# Patient Record
Sex: Female | Born: 1960
Health system: Southern US, Community
[De-identification: ages and names within clinical notes are randomized; demographics above are authoritative.]

## PROBLEM LIST (undated history)

## (undated) DIAGNOSIS — K589 Irritable bowel syndrome without diarrhea: Secondary | ICD-10-CM

## (undated) DIAGNOSIS — Z5189 Encounter for other specified aftercare: Secondary | ICD-10-CM

## (undated) DIAGNOSIS — Z9289 Personal history of other medical treatment: Secondary | ICD-10-CM

## (undated) DIAGNOSIS — I10 Essential (primary) hypertension: Secondary | ICD-10-CM

## (undated) DIAGNOSIS — M1990 Unspecified osteoarthritis, unspecified site: Secondary | ICD-10-CM

## (undated) HISTORY — PX: COLONOSCOPY: SHX174

## (undated) HISTORY — DX: Essential (primary) hypertension: I10

## (undated) HISTORY — DX: Encounter for other specified aftercare: Z51.89

## (undated) HISTORY — PX: BACK SURGERY: SHX140

## (undated) HISTORY — DX: Irritable bowel syndrome, unspecified: K58.9

---

## 1994-10-13 HISTORY — PX: ABDOMINAL HYSTERECTOMY: SHX81

## 2002-07-10 ENCOUNTER — Emergency Department (HOSPITAL_COMMUNITY): Admission: EM | Admit: 2002-07-10 | Discharge: 2002-07-10 | Payer: Self-pay | Admitting: Emergency Medicine

## 2002-07-16 ENCOUNTER — Encounter: Payer: Self-pay | Admitting: Specialist

## 2002-07-16 ENCOUNTER — Encounter (INDEPENDENT_AMBULATORY_CARE_PROVIDER_SITE_OTHER): Payer: Self-pay | Admitting: Specialist

## 2002-07-16 ENCOUNTER — Ambulatory Visit (HOSPITAL_COMMUNITY): Admission: RE | Admit: 2002-07-16 | Discharge: 2002-07-17 | Payer: Self-pay | Admitting: Specialist

## 2004-09-18 ENCOUNTER — Encounter: Admission: RE | Admit: 2004-09-18 | Discharge: 2004-09-18 | Payer: Self-pay | Admitting: Specialist

## 2004-10-13 HISTORY — PX: CERVICAL FUSION: SHX112

## 2004-10-13 HISTORY — PX: CHOLECYSTECTOMY: SHX55

## 2005-05-27 ENCOUNTER — Encounter (INDEPENDENT_AMBULATORY_CARE_PROVIDER_SITE_OTHER): Payer: Self-pay | Admitting: *Deleted

## 2005-05-27 ENCOUNTER — Ambulatory Visit (HOSPITAL_COMMUNITY): Admission: RE | Admit: 2005-05-27 | Discharge: 2005-05-28 | Payer: Self-pay | Admitting: Surgery

## 2005-08-14 ENCOUNTER — Ambulatory Visit (HOSPITAL_COMMUNITY): Admission: RE | Admit: 2005-08-14 | Discharge: 2005-08-15 | Payer: Self-pay | Admitting: Orthopaedic Surgery

## 2005-11-24 ENCOUNTER — Encounter: Admission: RE | Admit: 2005-11-24 | Discharge: 2005-11-24 | Payer: Self-pay | Admitting: Orthopaedic Surgery

## 2006-02-15 ENCOUNTER — Encounter: Admission: RE | Admit: 2006-02-15 | Discharge: 2006-02-15 | Payer: Self-pay | Admitting: Orthopaedic Surgery

## 2006-05-02 ENCOUNTER — Encounter: Admission: RE | Admit: 2006-05-02 | Discharge: 2006-05-02 | Payer: Self-pay | Admitting: Orthopaedic Surgery

## 2006-07-16 ENCOUNTER — Inpatient Hospital Stay (HOSPITAL_COMMUNITY): Admission: RE | Admit: 2006-07-16 | Discharge: 2006-07-20 | Payer: Self-pay | Admitting: Orthopaedic Surgery

## 2006-07-21 ENCOUNTER — Inpatient Hospital Stay (HOSPITAL_COMMUNITY): Admission: AD | Admit: 2006-07-21 | Discharge: 2006-07-27 | Payer: Self-pay | Admitting: Orthopaedic Surgery

## 2006-07-26 ENCOUNTER — Ambulatory Visit: Payer: Self-pay | Admitting: Internal Medicine

## 2007-01-25 ENCOUNTER — Encounter: Admission: RE | Admit: 2007-01-25 | Discharge: 2007-01-25 | Payer: Self-pay | Admitting: Orthopaedic Surgery

## 2007-03-09 ENCOUNTER — Encounter: Admission: RE | Admit: 2007-03-09 | Discharge: 2007-03-09 | Payer: Self-pay | Admitting: Orthopaedic Surgery

## 2008-01-22 ENCOUNTER — Encounter: Admission: RE | Admit: 2008-01-22 | Discharge: 2008-01-22 | Payer: Self-pay | Admitting: Orthopaedic Surgery

## 2010-11-03 ENCOUNTER — Encounter: Payer: Self-pay | Admitting: Specialist

## 2011-02-28 NOTE — Op Note (Signed)
Jennifer [name], Jennifer [name]                ACCOUNT NO.:  [id number]   MEDICAL RECORD NO.:  [id number]          PATIENT TYPE:  INP   LOCATION:  2550                         FACILITY:  [facility]   PHYSICIAN:  Sharolyn Douglas, M.D.        DATE OF BIRTH:  06/23/1961   DATE OF PROCEDURE:  07/16/2006  DATE OF DISCHARGE:                                 OPERATIVE REPORT   DIAGNOSES:  1. L5-S1 degenerative disk disease.  2. L5-S1 post-laminectomy syndrome.  3. L5-S1 lateral recess and foraminal stenosis.  4. Left-sided L4-5 disk bulge.   PROCEDURE:  1. Revision L5-S1 laminectomy with decompression of the thecal sac and      nerve roots bilaterally.  2. L5-S1 transforaminal lumbar interbody fusion with placement of 9-mm      PEEK cage.  3. L5-S1 pedicle screw instrumentation using the Abbott spine system.  4. Posterior spinal fusion, L5-S1.  5. Local autogenous bone graft supplemented with bone morphogenic protein.   SURGEON:  Sharolyn Douglas, M.D.   ASSISTANT:  Verlin Fester, P.A.   ANESTHESIA:  General endotracheal.   COMPLICATIONS:  None.   NEEDLE AND SPONGE COUNT:  Correct.   INDICATIONS:  The patient is a pleasant 50 year old female who has had  chronic progressive pain in her back and bilateral lower extremities, left  greater than right.  She has a history of L5-S1 bilateral laminotomy and  diskectomy.  Her imaging studies show severe degenerative changes at L5-S1  with disk space narrowing.  At L4-5, there is a small bulging disk on the  left side which does not appear to impinge on the nerve roots.  We discussed  various treatment options including lumbar decompression and fusion; at this  point, she has elected to proceed in hopes of improving her symptoms.  We  decided not to include the L4-5 level in the fusion, as there were very  minimal degenerative changes, except for the bulging disk on the left and  did not seem to warrant such an aggressive approach.  She is aware that she  may  develop degenerative changes in the future requiring extension of the  fusion.  Risks, benefits and alternatives were reviewed.   PROCEDURE:  The patient was identified in the holding area and after  informed consent, she was taken to the operating room.  She underwent  general endotracheal anesthesia without difficulty, given prophylactic IV  antibiotics.  Neuro-monitoring was established in the form of SSEPs and  lower extremity EMGs.  She was carefully turned prone onto the Wilson frame,  all bony prominences padded, face and eyes protected at all times.  Back  prepped and draped in the usual sterile fashion.  A midline incision was  made from L4 down to the sacrum.  The previous incision was off to the right  side over L5-S1 and this was not utilized, as it was off center.  Dissection  was carried to a very deep adipose layer.  The deep fascia was incised and a  subperiosteal exposure was carried out to the tips of the transverse process  of  L5 and the sacral ala bilaterally.  Again, this was very deep, due to the  patient's body habitus and the extreme lordosis.  Deep retractors were  placed; intraoperative x-ray was taken, confirming the levels.  We then  turned our attention to identifying the previous laminotomies at L5-S1  bilaterally.  Because of the severe disk space collapse, there was shingling  and bony overgrowth.  This was removed using the Leksell rongeur.  We then  removed the spinous process of 5 and used a high-speed bur to thin the  lamina.  Curettes and Kerrison punches were used to complete the revision  laminotomy.  Once the edges had been fully identified from the previous  defects, the epidural fibrosis was carefully cleared and the laminectomy  extended proximally.  We identified the S1 nerve roots bilaterally.  On the  left side especially, there was extensive scarring as well as a spur which  appeared to be impinging directly on the left S1 nerve root at its  takeoff.  This was carefully removed.  The dura over this area became quite thin and  there was an arachnoid bleb.  This was repaired using a 4-0 Nurolon suture  x1.  There was no active CSF leak, but it was felt that this could  potentially become a leak and therefore the suture was placed.  Once we were  satisfied with the decompression, we turned our attention to performing a  transforaminal lumbar interbody fusion in an effort to increase the fusion  rate and indirectly decompress the foramen.  The remaining facet joint on  the left side was osteotomized.  The disk space was completely collapsed and  we had to use successively larger dilators in order to distract up to 9 mm.  We then used curettes to clean off the cartilaginous endplates.  There was  no disk material in the disk space itself from the degenerative changes.  Once we had cleaned the disk space out, we packed it with BMP sponges and  local bone graft obtained from the laminectomy.  A 9-mm PEEK cage was packed  with a BMP sponge, carefully inserted into the interspace and tamped  anteriorly and across the midline; free-running EMGs were monitored  throughout this procedure and there were no deleterious changes.  We then  turned our attention to completing the posterior spinal fusion by  decorticating the transverse process of L5 and sacral ala bilaterally,  packing the remaining local bone graft into the gutters.  We placed pedicle  screws at L5 and S1 using anatomic probing technique.  We utilized 6.5 x 45-  mm screws at L5 and 7.5 x 30-mm screws in the sacrum.  We had good screw  purchase.  We stimulated each pedicle screw; there were no deleterious  changes.  In addition, the pedicle screw holes were palpated before placing  the screws and there were no breeches.  Hemostasis was achieved.  We elected  to place Duragen and Tisseel over the small dural repair.  A deep Hemovac drain was left, the deep fascia closed with a  running #1 Vicryl suture,  subcutaneous layer closed with 0 Vicryl followed by 2-0 Vicryl and then a  running 3-0 subcuticular Vicryl suture on the skin edges, Benzoin and Steri-  Strips placed and sterile dressing applied.  The patient was turned supine,  extubated without difficulty and transferred to Recovery in stable  condition, able to move her upper and lower extremities.   It should be noted  that my assistant, Verlin Fester, P.A., was present  throughout the procedure including during the positioning, the exposure, the  decompression, the instrumentation and the fusion.  She also assisted with  wound closure.      Sharolyn Douglas, M.D.  Electronically Signed     MC/MEDQ  D:  07/16/2006  T:  07/18/2006  Job:  [id number]

## 2011-02-28 NOTE — Discharge Summary (Signed)
NAMESERAI, TUKES                ACCOUNT NO.:  [id number]   MEDICAL RECORD NO.:  [id number]          PATIENT TYPE:  INP   LOCATION:  5005                         FACILITY:  [facility]   PHYSICIAN:  Sharolyn Douglas, M.D.        DATE OF BIRTH:  1961-01-02   DATE OF ADMISSION:  07/21/2006  DATE OF DISCHARGE:  07/27/2006                               DISCHARGE SUMMARY   ADMISSION DIAGNOSES:  1. Lumbar wound drainage, possible infection.  2. Previous L4-S1 fusion on June 16, 2006.   DISCHARGE DIAGNOSIS:  Status post I&D, doing well.   CONSULTATIONS:  Infectious Disease.   LABORATORY DATA:  CBC from July 21, 2006 did show decreased hemoglobin  and hematocrit, which remained decreased throughout her hospital stay  with low being 8.6 and 24.8 on July 25, 2006.  Her white count  started at 10.2 on July 21, 2006, on July 23, 2006 it was 9.6,  July 24, 2006 it was 14.5, and July 27, 2006 it was 11.3.  RDW  remained elevated ranging from 14.9 to 15.3 throughout her hospital  stay.  Platelets remained elevated ranging from 445 to 555 throughout  her hospital stay.  Basic metabolic panel showed elevated glucose 122,  otherwise normal.  On July 21, 2006 CRP was 30.3.  UA from July 21, 2006 was negative.  Blood cultures from July 21, 2006 showed no growth  at 5 days.  Urine culture showed no growth.  Wound culture from July 22, 2006 showed no growth at 2 days.  Anaerobic cultures showed no  anaerobes.  No x-rays.  No EKG was done.   PROCEDURE:  On July 22, 2006, patient was taken to the operating room  for I&D of her lumbar wound.   SURGEON:  Sharolyn Douglas, M.D.   ASSISTANT:  Verlin Fester, P.A.   ANESTHESIA:  General.   BRIEF HISTORY:  Patient is a 50 year old female who underwent lumbar  spine surgery L4-S1 fusion on July 16, 2006.  She was discharged on  July 20, 2006.  At that point, she was doing very well.  Since then,  unfortunately has developed  increasing redness and problems and returned  to see Dr. Noel Gerold.  He found some drainage from the wound as well as some  erythema around.  Thought she may have an early infection and thought  the best course management would be a washout.  The infection is  secondary to the possibility of having a deep infection.  The risk and  benefits were discussed with her.  She indicated understanding.  We  opted to proceed with the I&D.   HOSPITAL COURSE:  Patient was admitted to the hospital on July 22, 2006.  She was taken that afternoon for an I&D.  She tolerated the  procedure well without any intraoperative complications.   Postoperatively, patient was placed on vancomycin while we awaited the  culture results.  She did well throughout her hospital stay.  She  progressed along well with physical therapy.  She was kept for several  days postoperatively until her culture  results were final.  Infectious  disease consult was done during her hospital stay for assistance with  antibiotic recommendations.  They recommended doxycycline and Rocephin  for her discharge medications.   On July 27, 2006, patient was doing well.  Her culture results were  final.  Infectious disease had made good recommendations of Rocephin and  doxycycline, then patient was discharged home on those medications.   PLAN:  Patient is a 50 year old female status post irrigation and  debridement of a lumbar wound.  Activity daily ambulation.  Back  precautions at all times.  Brace only when she is up.  Daily dressing  changes.  Notify us of any signs of increasing drainage or redness.  Daily ambulation program.   DIET:  Regular home diet as tolerated.   DISCHARGE MEDICATIONS:  1. Rocephin  2. Doxycycline.  3. Percocet for pain.  4. Robaxin for muscle spasms.  5. Multivitamin daily.  6. Calcium daily.  7. Colace as needed.  8. Laxative as needed.   AVOID NON-STEROIDAL ANTI-INFLAMMATORY DRUGS (NSAIDS)    CONDITION ON DISCHARGE:  Stable and improved.   DISPOSITION:  Patient can be discharged to her home with her families  assistance as well as home health physical therapy and occupational  therapy.      Verlin Fester, P.A.      Sharolyn Douglas, M.D.  Electronically Signed    CM/MEDQ  D:  09/17/2006  T:  09/17/2006  Job:  [id number]   cc:   Sharolyn Douglas, M.D.

## 2011-02-28 NOTE — H&P (Signed)
NAMESHARNETTA, GIELOW                ACCOUNT NO.:  [id number]   MEDICAL RECORD NO.:  [id number]          PATIENT TYPE:  INP   LOCATION:  5005                         FACILITY:  [facility]   PHYSICIAN:  Sharolyn Douglas, M.D.        DATE OF BIRTH:  1961-10-12   DATE OF ADMISSION:  07/21/2006  DATE OF DISCHARGE:                                HISTORY & PHYSICAL   CHIEF COMPLAINT:  Lumbar wound drainage.   HISTORY OF PRESENT ILLNESS:  The patient is a pleasant 50 year old female  who is now five days status post revision L5-S1 decompression and fusion.  Her postoperative course was unremarkable, and she was discharged home on  postoperative day #3.  She called the office today with increasing wound  drainage.  I asked that she come in immediately.  In the office she was  found to have a serosanguineous drainage; and I felt it would be best that  she be admitted to the hospital for formal I&D of the wound and antibiotic  treatment after cultures.   She has had a low grade fever at home.   No nausea or vomiting.  No chest pain.  No numbness, tingling or weakness.  No headaches.   PAST MEDICAL HISTORY:  Noncontributory.   PAST SURGICAL HISTORY:  Partial hysterectomy; right knee arthroscopy x two;  and anterior cervical fusion.  She has also had a bilateral L5-S1 hemi  laminotomy by Dr. Otelia Sergeant; and the recent L5-S1 fusion five days ago.   ALLERGIES:  LODINE.   SOCIAL HISTORY:  She is married.  She does not work.  She does not use  tobacco or alcohol.   PHYSICAL EXAMINATION:  GENERAL APPEARANCE:  5 feet 5 inches tall.  She is  pleasant and cooperative.  BACK:  She has a serosanguineous drainage from the superior portion of her  wound.  There is also some redness and blistering which appears to be  related to the tape, although it could be a cellulitis.  NEUROLOGIC:  Neurologically, she is intact.  Straight leg raising is  negative.  CHEST:  Her chest is clear to auscultation.  HEENT:   Normocephalic, atraumatic.  HEART:  Regular rate and rhythm.  Distal pulses are palpable.  ABDOMEN:  Soft, nontender.   IMPRESSION:  Lumbar wound drainage, possible deep infection.   PLAN:  I reviewed the situation with the patient and her husband.  She is a  rather large lady and I am concerned she may have a deep infection.  I think  in this case it is better to be aggressive, and I will admit her to the  hospital for formal I&D of the wound tomorrow.  We will obtain laboratory  studies today and start her on vancomycin.  The risks, benefits and  alternatives were reviewed.      Sharolyn Douglas, M.D.  Electronically Signed     MC/MEDQ  D:  07/21/2006  T:  07/22/2006  Job:  [id number]

## 2011-02-28 NOTE — Op Note (Signed)
Jennifer [name], Jennifer [name]                         ACCOUNT NO.:  [id number]   MEDICAL RECORD NO.:  [id number]                   PATIENT TYPE:  OIB   LOCATION:  5008                                 FACILITY:  [facility]   PHYSICIAN:  Kerrin Champagne, M.D.                DATE OF BIRTH:  Apr 29, 1961   DATE OF PROCEDURE:  07/16/2002  DATE OF DISCHARGE:  07/17/2002                                 OPERATIVE REPORT   PREOPERATIVE DIAGNOSIS:  Large herniated nucleus pulposus, L5-S1 central and  left-sided, with extruded fragment.   POSTOPERATIVE DIAGNOSIS:  Large herniated nucleus pulposus, L5-S1 central  and left-sided, with extruded fragment.   PROCEDURE:  Left-sided L5-S1 microdiskectomy with exploration of disk, right  L5-S1.   SURGEON:  Kerrin Champagne, M.D.   ASSISTANT:  Wende Neighbors, P.A.   ANESTHESIA:  GOT, Burna Forts, M.D., supplemented with local  infiltration of Marcaine 0.5% with 1:200,000 epinephrine 15 cc.   ESTIMATED BLOOD LOSS:  15 cc.   DRAINS:  None.   SPECIMENS:  Disk material for gross only.   BRIEF CLINICAL HISTORY:  The patient is a 50 year old female with a two-  month history of back pain since having fallen in her yard.  She relates  over the last three weeks that her pain is significantly worse with the  discomfort radiating into both posterior legs.  Seen in the office.  MRI  studies demonstrate a large disk protrusion L5-S1 with extruded disk  material causing significant compression and cauda equina.  The patient's  clinical exam shows 3/3 sciatic tension signs positive, opposite straight  leg raise positive, diminished left ankle jerk.  She has significant loss of  function associated with her present condition, and prognosis for recovery  with conservative measures is markedly diminished with extruded herniation.  She is brought to the operating room to undergo microdiskectomy bilaterally  if necessary, as she has bilateral left complaints, left  side greater than  right.   INTRAOPERATIVE FINDINGS:  Large central disk herniation with extrusion  toward the left side.  Almost all of the disk material was removed via the  left microdiskectomy.  The right side did not require disk excision.  Large  ventral osteophyte also present, which was left intact.   DESCRIPTION OF PROCEDURE:  After adequate general anesthesia, patient in  knee-chest position, Andrews frame, standard preoperative antibiotics,  standard prep with Duraprep solution, draped in the usual manner, iodine Vi-  Drape was used.  Two spinal needles placed at the expected L5-S1 level with  intraoperative lateral radiograph demonstrating needles at L4-5.  The  incision made below these needles in line with midline approximately two  inches in length through the skin and subcutaneous layers after infiltration  with Marcaine 0.5% with 1:200,000 epinephrine.  Incision was carried sharply  down to the lumbodorsal fascia.  This was incised in both sides of the  spinous process of L4, seeing the L5 and S1.  Cobbs were then used to  elevate the paralumbar muscles off of the posterior elements of L5 and S1,  exposing the posterior interlaminar region bilaterally.  The McCullough  retractor was inserted, a clamp placed over the spinous process of L5, an  additional Penfield 4 placed within the facet at the right-sided L5-S1.  Intraoperative lateral radiograph demonstrated a clamp on the L5 spinous  process and the probe below this level.  Using loupe magnification, the  initial entry into the spinal canal was performed, first debriding the  superficial portions of the ligamentum flavum using the Leksell rongeur and  3 mm Kerrison bilaterally.  Entry was made in the spinal canal over the  superior aspect of the S1 lamina using a 2 and 3 mm Kerrison, freeing up the  ligamentum flavum at its attachment superior to S1.  The attachment also  resected off the medial aspect of the L5-S1  facet bilaterally, a minimal  less than 5% amount of the facet was resected at the same time.  Ligamentum  flavum was then debrided off of the inferior anterior aspect of the lamina  of L5 bilaterally, thus freeing up the ligamentum flavum.  On the left side  the flavum appeared to be still deeply attached to the thecal sac and made  mobile so that it was excised in total on the left side.  The operating room  microscope was then draped, brought into the field.  Under the operating  room microscope, the left side thecal side was retracted medially, as was  the S1 nerve root identified and retracted and protected with D'Errico.  A  nerve hook was used to tease the extruded disk material from beneath the  thecal sac centrally and out into the lateral recess over the left side,  where it could be excised using a pituitary rongeur and nerve hook, freeing  up extruded material, tracing its origin back to a rent in the posterior  longitudinal ligament at the central portions of the disk.  Through this  rent, then, pituitary rongeurs were used to excise remaining portion of the  partially-extruded disk as well as disk material within the disk space that  was loose and fragmented.  Upbiting and downbiting pituitary rongeur was  used to excise loose fragments from the disk space.  Irrigation performed  using antibiotic irrigant solution.  A hockey stick neurologic probe then  used to probe the neural canal.  The L5 nerve root probed freely.  A small  amount of reflected ligamentum flavum was further resected in this region.  The S1 nerve root probed freely on the left side.  The right side thecal sac  was then examined by retracting the right side thecal sac and right S1 nerve  root, examining the disk space posteriorly.  The disk was noted to be  bulging slightly, but no significant protrusion.  A midline osteophyte was  evident over the posterior aspect of the S1 vertebral body and over  the inferior lip at the L5-S1 level.  This, however, seemed to represent chronic  osteophyte change and nothing acute, so it was left intact.  Irrigation was  then performed, hemostasis using thrombin-soaked Gelfoam.  The thrombin-  soaked Gelfoam was then removed.  No active bleeding was felt to be present.  Soft tissues were allowed to fall back into place and the dorsal fascia  reapproximated with interrupted simple and figure-of-eight sutures of 0  Vicryl, the deep subcu layers approximated with interrupted 0 Vicryl  sutures, and the superficial layers with interrupted 2-0 Vicryl sutures and  the skin closed with a subcuticular stitch of 4-0 Vicryl.  Tincture of  Benzoin and Steri-Strips applied, 4 x 4's fixed to the skin with Hypafix  tape.  The patient was then returned to a supine position, extubated, and  returned to the recovery room in satisfactory condition.                                               Kerrin Champagne, M.D.    Myra Rude  D:  07/16/2002  T:  07/18/2002  Job:  [id number]

## 2011-02-28 NOTE — Op Note (Signed)
NAMETAKERIA, [name]                ACCOUNT NO.:  [id number]   MEDICAL RECORD NO.:  [id number]          PATIENT TYPE:  INP   LOCATION:  5005                         FACILITY:  [facility]   PHYSICIAN:  Sharolyn Douglas, M.D.        DATE OF BIRTH:  1961-05-07   DATE OF PROCEDURE:  07/22/2006  DATE OF DISCHARGE:                                 OPERATIVE REPORT   DIAGNOSIS:  Persistent lumbar wound drainage with possible infection.   PROCEDURE:  Incision and drainage of lumbar wound.   SURGEON:  Sharolyn Douglas, MD   ASSISTANT:  Verlin Fester, P.A.   ANESTHESIA:  General endotracheal.   ESTIMATED BLOOD LOSS:  Minimal.   COMPLICATIONS:  None.  Needle and sponge count correct.   INDICATIONS:  The patient is a pleasant 50 year old female who is one week  status post L5-S1 decompression and fusion.  She did well postoperatively  and was discharged home on postoperative day number 3.  She was readmitted  to the hospital yesterday with persistent wound drainage and there was  concern for infection.  She was started on vancomycin and the redness around  the incision improved dramatically although she had persistent drainage.  She is now taken to the operating room for I&D.  Risks, benefits, and  alternatives were reviewed.   PROCEDURE:  The patient was identified in the holding area after informed  consent and taken to the operating room.  Underwent general endotracheal  anesthesia without difficulty.  She was then turned prone onto the Mulberry  frame.  All bony prominences padded.  Face and eyes protected at all times.  Back prepped and draped in the usual sterile fashion.  The incision was  opened and the previous skin sutures were removed.  There was a small seroma  which was decompressed.  There was no gross pus.  We then opened the deep  fascia.  Again we did not identify any gross purulence.  The dissection was  carried down to the thecal sac and instrumentation.  Again there was quite a  bit of  clot, but no obvious infection.  All of the hematoma was decompressed  and removed.  We then irrigated the wound with copious amounts of pulse  lavage as well as antibiotic solution.  The thecal sac was examined and  there was no CSF leak.  We then placed a deep Hemovac drain.  The deep  fascia was then closed tightly using #1 PDS suture.  Subcutaneous layer  which was very deep closed with three layers of 0 PDS suture.  The skin was  closed with interrupted 2-0 PDS suture followed by a running 3-0 nylon  suture to approximate the skin edges.  It should be noted that we also  placed a superficial Hemovac drain before closing the skin.  A sterile  dressing was applied.  The patient was turned supine and extubated without  difficulty.  Please note that my assistant [facility], PA was  present throughout the procedure including during the positioning, the I&D  and she also assisted with wound closure.  The patient was transferred to  recovery in stable condition able to move her upper and lower extremities.  We are going to keep her on vancomycin, consult infectious disease, and  check her cultures.      Sharolyn Douglas, M.D.  Electronically Signed     MC/MEDQ  D:  07/23/2006  T:  07/24/2006  Job:  [id number]

## 2011-02-28 NOTE — H&P (Signed)
Jennifer [name], Jennifer [name]                ACCOUNT NO.:  [id number]   MEDICAL RECORD NO.:  [id number]          PATIENT TYPE:  EMS   LOCATION:  MAJO                         FACILITY:  [facility]   PHYSICIAN:  Sharolyn Douglas, M.D.        DATE OF BIRTH:  26-Aug-1961   DATE OF ADMISSION:  08/14/2005  DATE OF DISCHARGE:                                HISTORY & PHYSICAL   CHIEF COMPLAINT:  Pain in my neck and left arm.   HISTORY OF PRESENT ILLNESS:  This 50 year old female seen by [place] for  continued progressive problems concerning pain into her cervical spine with  radiation into her left upper extremity.  She has extreme numbness and  tingling into the left hand.  She has had a neurometrics test noting that  there a radiculopathy is present.  MRI has showed a moderate central disk  protrusion at C5-6, deforming the cervical cord itself.  Spinal stenosis is  seen secondary to this.  The patient has pain with all range of motion of  the cervical spine and weakness as noted in the left upper extremity.  After  much discussion and consideration, including the risks and benefits of  surgery, we felt this patient would benefit from surgical intervention, and  is being admitted for anterior cervical diskectomy and fusion of C5-6 with  Allograft and plate.   PAST MEDICAL HISTORY:  This patient had a fairly benign past medical  history.  She does have GERD, gallbladder disease in the past with a  cholecystectomy.  She had back surgery in July 15, 2002, knee surgery in  1995, partial hysterectomy done in 1995.  Dr. Servando Snare is her family  physician.   ALLERGIES:  1.  LODINE.  2.  ROBAXIN causes her lips to swell.   CURRENT MEDICATIONS:  Darvocet.   FAMILY HISTORY:  Positive for hypertension in the father, diabetes in  grandfather and brother, and cancer in the father which is prostate cancer.   SOCIAL HISTORY:  The patient is separated.  She is an Tree surgeon.  She has no  intake of alcohol or tobacco  products.  She has three children.  Her  daughters will be caregivers after surgery.   REVIEW OF SYSTEMS:  CNS:  No seizures, shoulder paralysis, numbness, or  double vision, but the patient does have radiculitis as in present illness  above in the right upper extremity secondary to cervical stenosis.  CARDIOVASCULAR:  No chest pain, no angina, no orthopnea.  GASTROINTESTINAL:  No nausea, vomiting, melena, bloody stools.  GENITOURINARY:  No discharge,  hematuria, dysuria.  MUSCULOSKELETAL:  As in present illness.   PHYSICAL EXAMINATION:  GENERAL:  Alert, cooperative, friendly, 50 year old  black female.  VITAL SIGNS:  Blood pressure 118/78, pulse 80, respirations are 12.  HEENT:  Normocephalic.  PERRLA.  EOM's intact.  Oropharynx is clear.  CHEST:  Clear to auscultation.  No wheezes, rhonchi, or rales.  HEART:  Regular rate and rhythm, no murmurs are heard.  ABDOMEN:  Soft, nontender, liver and spleen not felt.  GENITOURINARY:  Not done, not pertinent to  present illness.  RECTAL:  Not done, not pertinent to present illness.  PELVIC:  Not done, not pertinent to present illness.  BREASTS:  Not done, not pertinent to present illness.  EXTREMITIES:  The patient has weakness of the right upper extremity with  tingling and reduced grip.  Range of motion is essentially non-existent due  to the spasms and the way she holds her head.   ADMISSION DIAGNOSES:  1.  Herniated nucleus pulposis, C5-6.  2.  Gastroesophageal reflux disease.   PLAN:  The patient will be admitted for anterior cervical diskectomy and  fusion, C5-6, with Allograft and plate.      [name].      Sharolyn Douglas, M.D.  Electronically Signed    DLU/MEDQ  D:  08/01/2005  T:  08/02/2005  Job:  [id number]

## 2011-02-28 NOTE — Op Note (Signed)
Jennifer [name], Jennifer [name]                ACCOUNT NO.:  [id number]   MEDICAL RECORD NO.:  [id number]          PATIENT TYPE:  OIB   LOCATION:  0098                         FACILITY:  [facility]   PHYSICIAN:  Velora Heckler, MD      DATE OF BIRTH:  1961/08/14   DATE OF PROCEDURE:  05/27/2005  DATE OF DISCHARGE:                                 OPERATIVE REPORT   PREOPERATIVE DIAGNOSES:  Biliary dyskinesia, abdominal pain.   POSTOPERATIVE DIAGNOSES:  Biliary dyskinesia, abdominal pain.   PROCEDURE:  Laparoscopic cholecystectomy with intraoperative  cholangiography.   SURGEON:  Velora Heckler, MD   ASSISTANT:  Lovie Chol, MD   ANESTHESIA:  General.   ESTIMATED BLOOD LOSS:  Minimal.   PREPARATION:  Betadine.   COMPLICATIONS:  None.   INDICATIONS:  The patient is a 50 year old black female from [place], [place].  She presents at the request of Dr. Manson Passey from [facility] in [place] with abdominal pain, nausea and vomiting.  This has been  present for several weeks.  The patient initially presented in July.  She  underwent abdominal ultrasound at [facility] on July19.  This was  within normal limits.  The patient underwent nuclear medicine hepatobiliary  scanning at [facility].  This showed no evidence of cystic  duct or common bile duct obstruction.  However, with administration of CCK,  the patient experienced similar symptoms of right upper quadrant abdominal  pain and nausea.  Ejection fraction was low at less than 30%.  Diagnosis of  biliary dyskinesia was made, and the patient was referred for  cholecystectomy.   Procedure is done in OR #1 at [facility].  The  patient is brought to the operating room, placed in a supine position on the  operating room table.  Following administration of general anesthesia, the  patient is prepped and draped in the usual strict aseptic fashion.  After  ascertaining that an adequate  level of anesthesia had been obtained, an  infraumbilical incision was made with a #15 blade.  Dissection was carried  through subcutaneous tissues.  Fascia is incised in the midline.  The  peritoneal cavity is entered cautiously.  Vicryl 0 pursestring suture is  placed in the fascia.  An Hasson cannula is introduced and secured with a  pursestring suture.  Abdomen is insufflated with carbon dioxide.  Laparoscope is introduced and the abdomen explored.  Operative ports are  placed along the right costal margin in the midline, midclavicular line, and  anterior axillary line.  Fundus of the gallbladder is grasped and retracted  cephalad.  Dissection is begun at the undersurface of the gallbladder.  There is significant adhesions of the lesser omentum to the undersurface of  the gallbladder.  These are gently taken down with blunt dissection and  hemostasis obtained with the electrocautery.  Dissection is begun at the  neck of the gallbladder.  Cystic duct is dissected out along its length.  Cystic artery is also dissected out.  Cystic artery is doubly clipped and  divided.  Clip is placed at the neck of the gallbladder, and the cystic duct  is incised.  Small amount of mucoid material is expressed from the cystic  duct followed by yellow bile.  A Cook cholangiography catheter is introduced  through a stab wound in the right upper quadrant and inserted into the  cystic duct.  It is secured with a Ligaclip.  Using C-arm fluoroscopy, real  time cholangiography is performed. There is rapid filling of a normal  caliber common bile duct.  There is reflux into the right and left hepatic  ductal systems.  There is free flow distally into the duodenum.  The clip is  withdrawn, and the [facility] catheter is removed from the peritoneal cavity.  Cystic duct is doubly clipped and divided.  Gallbladder is then excised from  the gallbladder bed using the hook electrocautery for hemostasis.  Gallbladder is  completely excised and then extracted through the umbilical  port.  It is submitted to pathology for review.  Right upper quadrant is  copiously irrigated with warm saline which is evacuated.  Good hemostasis is  noted.  Ports are removed.  Pneumoperitoneum is released.  Vicryl 0  pursestring suture is tied at the umbilicus.  Port sites had previously been  anesthetized with local anesthetic.  All wounds are closed with interrupted  4-0 Vicryl subcuticular sutures.  Wounds are washed and dried, and Benzoin  and Steri-Strips are applied.  Sterile dressings are applied.  The patient  is awakened from anesthesia and brought to the recovery room in stable  condition.  The patient tolerated the procedure well.      Velora Heckler, MD  Electronically Signed     TMG/MEDQ  D:  05/27/2005  T:  05/27/2005  Job:  [id number]   cc:   Charlott Rakes, MD  [place], [place]

## 2011-02-28 NOTE — Discharge Summary (Signed)
Jennifer [name], Jennifer [name]                ACCOUNT NO.:  [id number]   MEDICAL RECORD NO.:  [id number]          PATIENT TYPE:  INP   LOCATION:  5001                         FACILITY:  [facility]   PHYSICIAN:  Jennifer [name], P.A.    DATE OF BIRTH:  March 29, 1961   DATE OF ADMISSION:  07/16/2006  DATE OF DISCHARGE:  07/20/2006                               DISCHARGE SUMMARY   ADMISSION DIAGNOSES:  L5-S1 degenerative disc disease and spondylosis.   PROCEDURE:  On July 16, 2006 patient was taken to the operating room  for L5-S1 revision decompression of posterior spinal fusion.   SURGEON:  Dr. Sharolyn [name]   ASSISTANT:  Jennifer Fester, PA   ANESTHESIA:  General.   CONSULTATIONS:  None.   LABORATORY DATA:  Preoperative CBC with differential was within normal  limits with the exception of platelets of 445.  Postoperatively  hemoglobin and hematocrit were monitored, reached a low of 7.9 and 22.8  on July 19, 2006.  She was transfused and the following day her  hemoglobin and hematocrit were 11.1 and 32.1.  Complete metabolic panel  preop was normal.  Postoperatively, basic metabolic panel was monitored.  She had a couple of episodes of elevated glucose on July 17, 2006 of  101 and July 19, 2006 of 122.  Her potassium did decrease down October  7 and October 8 to 3.3 and 3.2 respectively.  Sodium was decreased to  131 on July 18, 2006.  Calcium remained low throughout her  postoperative course ranging from 7.2 to 7.9.  UA was negative.  Blood  typing was O positive.  Antibody screen was negative.  X-rays of the  lumbar spine from July 20, 2006 did show stable appearance of L5-S1  fusion.  On October 4  x-rays were used intraoperatively for  localization.   BRIEF HISTORY AND PHYSICAL:  The patient is a 50 year old female who is  having increasing problems related to her back. She has tried and failed  conservative treatments.  She has had increasing dose of medications in  order to control  her symptoms.  These symptoms were interfering with her  activities of daily living and quality of life.  She described the pain  as severe.  Because of these x-ray and MRI findings as well as her  failure to improve with conservative treatments, it is felt that the  best course of management would be an L5-S1 fusion.  The risks and  benefits of this were discussed with the patient by Dr. Noel [name] as well as  myself.  She indicated understanding and opted to proceed.   HOSPITAL COURSE:  On July 16, 2006, the patient was admitted to the  hospital and taken to the operating room for the above procedure.  She  tolerated the procedure well.  There were no intraoperative  complications and she was transferred to the recovery room in and stable  condition.   Postoperatively, routine orthopaedics/spine protocol was followed.  She  progressed along well through her routine postoperative course.  Physical therapy and occupational therapy worked with her on a daily  basis.  She made good progress.  She did have anemia, postoperative  anemia throughout her hospital stay.  It did reach a low of 7.9 as  previously dictated and she was transfused two units of packed red blood  cells.  She tolerated the transfusion without any apparent  complications.  The following day her hemoglobin and hematocrit had  increased nicely as previously dictated.  By July 20, 2006 patient was  doing much better.  She was feeling very good and was ready for  discharge home.  She had met all her orthopedic goals and she was  medically stable and ready for discharge.   DISCHARGE PLAN:  The patient is a 50 year old female status post  posterior spinal fusion, doing well on activities and daily ambulation  program.  Back precautions at all times.  No lifting heavier than five  pounds.  Daily dressing change.  Keep incision dry until drainage stops.  Followup two weeks postoperatively with Dr. Noel [name].  Diet:  Regular home   diet as tolerated.   CONDITION ON DISCHARGE:  Stable, improved.   DISPOSITION:  Patient can be discharged to her home with family  assistance as well as home health, physical therapy and occupational  therapy.      Jennifer [name], P.A.     CM/MEDQ  D:  09/17/2006  T:  09/17/2006  Job:  [id number]   cc:   Jennifer [name], M.D.

## 2011-02-28 NOTE — Op Note (Signed)
Jennifer [name], Jennifer [name]                ACCOUNT NO.:  [id number]   MEDICAL RECORD NO.:  [id number]          PATIENT TYPE:  OIB   LOCATION:  5033                         FACILITY:  [facility]   PHYSICIAN:  Sharolyn Douglas, M.D.        DATE OF BIRTH:  06-29-1961   DATE OF PROCEDURE:  08/14/2005  DATE OF DISCHARGE:                                 OPERATIVE REPORT   DIAGNOSIS:  Cervical spondylotic radiculopathy and herniated nucleus  pulposus, C5-6.   PROCEDURE:  1.  Anterior cervical diskectomy, C5-6.  2.  Anterior cervical arthrodesis, C5-6, with placement of allograft      prosthesis spacer packed with local autogenous bone graft.  3.  Anterior cervical plating, C5-6, using the Abbott Spine System.   SURGEON:  Sharolyn Douglas, M.D.   ASSISTANT:  Verlin Fester, P.A.   ANESTHESIA:  General endotracheal.   ESTIMATED BLOOD LOSS:  Minimal.   COMPLICATIONS:  None.   INDICATION:  The patient is a pleasant 50 year old female with neck and left  upper extremity pain thought to be secondary to cervical radiculopathy.  She  has failed to respond to appropriate conservative measures and elected to  undergo ACF at C5-6 in hopes of improving her symptoms.  The risk and  benefits were reviewed.   PROCEDURE:  The patient was identified in the holding area and taken to the  operating room, underwent general endotracheal anesthesia without  difficulty, given prophylactic IV antibiotics.  Carefully positioned supine  on the operating room table with the neck in slight extension.  Five pounds  of halter traction were applied, neck prepped and draped in usual sterile  fashion.  A small transverse incision was made in a natural skin crease at  the level of the cricoid cartilage.  Dissection was carried sharply through  the platysma and an interval between the SCM and strap muscles developed  down to the prevertebral space.  A spinal needle was placed, identifying the  C6-7 level and then we worked up from there  after taking an x-ray.  The  longus coli muscle was elevated out over the C5-6 disk space.  Deep  Shadowline retractor placed, esophagus, trachea, and carotid sheath  identified and protected all times.  Caspar distraction pins were placed in  C5 and C6 vertebral bodies and gentle distraction applied.  The Leica  microscope was draped and brought into the field and diskectomy carried back  to the posterior longitudinal ligament.  The disk was degenerative.  The  cartilaginous endplates were scraped clean.  There was a rent in the  posterior longitudinal ligament.  At this point, the ligament was taken down  and the subligamentous disk herniation was decompressed, which was midline  and towards the left.  The vertebral margins were undercut and  foraminotomies were completed.  Hemostasis was achieved.  A 7-mm allograft  prosthesis spacer was then packed with the local autogenous bone graft  obtained from the drill shavings.  This was countersunk 1 mm.  We then  placed a 22-mm anterior cervical plate with four 12-mm screws; we had good  screw purchase and ensured that the locking mechanism engaged.  The wound  was irrigated.  Hemostasis was achieved.  Deep Penrose drain was left in  place, the esophagus, trachea and carotid sheath examined with no apparent  injuries.  Platysma was closed with interrupted 2-0 Vicryl, subcutaneous  layer closed  with interrupted 3-0 Vicryl followed by a running 4-0 subcuticular Vicryl  suture on skin edges, Benzoin and Steri-Strips placed, sterile dressing  applied.  The patient was placed into the soft cervical collar, extubated  without difficulty and transferred to Recovery in stable condition.      Sharolyn Douglas, M.D.  Electronically Signed     MC/MEDQ  D:  08/14/2005  T:  08/15/2005  Job:  [id number]

## 2011-04-13 HISTORY — PX: TOTAL KNEE ARTHROPLASTY: SHX125

## 2011-04-22 ENCOUNTER — Ambulatory Visit (HOSPITAL_COMMUNITY)
Admission: RE | Admit: 2011-04-22 | Discharge: 2011-04-22 | Disposition: A | Discharge status: Home / Self Care | Payer: Medicare Other | Source: Ambulatory Visit | Attending: Orthopaedic Surgery | Admitting: Orthopaedic Surgery

## 2011-04-22 ENCOUNTER — Other Ambulatory Visit (HOSPITAL_COMMUNITY): Payer: Self-pay | Admitting: Orthopaedic Surgery

## 2011-04-22 ENCOUNTER — Encounter (HOSPITAL_COMMUNITY)
Admission: RE | Admit: 2011-04-22 | Discharge: 2011-04-22 | Disposition: A | Discharge status: Home / Self Care | Payer: Medicare Other | Source: Ambulatory Visit | Attending: Orthopaedic Surgery | Admitting: Orthopaedic Surgery

## 2011-04-22 DIAGNOSIS — IMO0002 Reserved for concepts with insufficient information to code with codable children: Secondary | ICD-10-CM | POA: Insufficient documentation

## 2011-04-22 DIAGNOSIS — Z01811 Encounter for preprocedural respiratory examination: Secondary | ICD-10-CM

## 2011-04-22 DIAGNOSIS — M1710 Unilateral primary osteoarthritis, unspecified knee: Secondary | ICD-10-CM | POA: Insufficient documentation

## 2011-04-22 DIAGNOSIS — Z01812 Encounter for preprocedural laboratory examination: Secondary | ICD-10-CM | POA: Insufficient documentation

## 2011-04-22 DIAGNOSIS — Z01810 Encounter for preprocedural cardiovascular examination: Secondary | ICD-10-CM | POA: Insufficient documentation

## 2011-04-22 LAB — CBC
HCT: 40.5 % (ref 36.0–46.0)
Hemoglobin: 14.3 g/dL (ref 12.0–15.0)
MCH: 27.6 pg (ref 26.0–34.0)
MCHC: 35.3 g/dL (ref 30.0–36.0)
MCV: 78.2 fL (ref 78.0–100.0)
Platelets: 312 10*3/uL (ref 150–400)
RDW: 14.4 % (ref 11.5–15.5)
WBC: 8.9 10*3/uL (ref 4.0–10.5)

## 2011-04-22 LAB — BASIC METABOLIC PANEL
BUN: 12 mg/dL (ref 6–23)
CO2: 30 mEq/L (ref 19–32)
Calcium: 9.3 mg/dL (ref 8.4–10.5)
Chloride: 101 mEq/L (ref 96–112)
Creatinine, Ser: 0.83 mg/dL (ref 0.50–1.10)
GFR calc Af Amer: >60 mL/min (ref >60)
GFR calc non Af Amer: >60 mL/min (ref >60)
Glucose, Bld: 86 mg/dL (ref 70–99)
Sodium: 139 mEq/L (ref 135–145)

## 2011-04-22 LAB — SURGICAL PCR SCREEN: MRSA, PCR: NEGATIVE

## 2011-04-22 LAB — PROTIME-INR: Prothrombin Time: 14.2 seconds (ref 11.6–15.2)

## 2011-04-22 LAB — APTT: aPTT: 36 seconds (ref 24–37)

## 2011-05-01 ENCOUNTER — Inpatient Hospital Stay (HOSPITAL_COMMUNITY)
Admission: RE | Admit: 2011-05-01 | Discharge: 2011-05-04 | DRG: 470 | Disposition: A | Discharge status: Home Health | Payer: Medicare Other | Source: Ambulatory Visit | Attending: Orthopaedic Surgery | Admitting: Orthopaedic Surgery

## 2011-05-01 DIAGNOSIS — M1710 Unilateral primary osteoarthritis, unspecified knee: Principal | ICD-10-CM | POA: Diagnosis present

## 2011-05-01 DIAGNOSIS — Z7901 Long term (current) use of anticoagulants: Secondary | ICD-10-CM

## 2011-05-01 DIAGNOSIS — I10 Essential (primary) hypertension: Secondary | ICD-10-CM | POA: Diagnosis present

## 2011-05-01 DIAGNOSIS — IMO0002 Reserved for concepts with insufficient information to code with codable children: Principal | ICD-10-CM | POA: Diagnosis present

## 2011-05-02 LAB — CBC
HCT: 31.5 % — ABNORMAL LOW (ref 36.0–46.0)
Hemoglobin: 10.8 g/dL — ABNORMAL LOW (ref 12.0–15.0)
MCH: 27 pg (ref 26.0–34.0)
MCV: 78.8 fL (ref 78.0–100.0)
RBC: 4 MIL/uL (ref 3.87–5.11)
RDW: 14.3 % (ref 11.5–15.5)
WBC: 13.3 10*3/uL — ABNORMAL HIGH (ref 4.0–10.5)

## 2011-05-02 LAB — BASIC METABOLIC PANEL WITH GFR
BUN: 10 mg/dL (ref 6–23)
CO2: 31 meq/L (ref 19–32)
Calcium: 8.2 mg/dL — ABNORMAL LOW (ref 8.4–10.5)
Chloride: 99 meq/L (ref 96–112)
Creatinine, Ser: 0.76 mg/dL (ref 0.50–1.10)
GFR calc Af Amer: >60 mL/min
GFR calc non Af Amer: >60 mL/min
Glucose, Bld: 110 mg/dL — ABNORMAL HIGH (ref 70–99)
Potassium: 3.3 meq/L — ABNORMAL LOW (ref 3.5–5.1)
Sodium: 137 meq/L (ref 135–145)

## 2011-05-02 LAB — PROTIME-INR
INR: 1.29 (ref 0.00–1.49)
Prothrombin Time: 16.3 seconds — ABNORMAL HIGH (ref 11.6–15.2)

## 2011-05-03 LAB — BASIC METABOLIC PANEL
CO2: 30 mEq/L (ref 19–32)
Chloride: 99 mEq/L (ref 96–112)
Sodium: 137 mEq/L (ref 135–145)

## 2011-05-03 LAB — PROTIME-INR: INR: 1.76 — ABNORMAL HIGH (ref 0.00–1.49)

## 2011-05-03 LAB — CBC
HCT: 27.4 % — ABNORMAL LOW (ref 36.0–46.0)
Hemoglobin: 9.4 g/dL — ABNORMAL LOW (ref 12.0–15.0)
MCV: 78.3 fL (ref 78.0–100.0)
WBC: 14.7 10*3/uL — ABNORMAL HIGH (ref 4.0–10.5)

## 2011-05-04 LAB — CBC
HCT: 26.8 % — ABNORMAL LOW (ref 36.0–46.0)
Hemoglobin: 9.2 g/dL — ABNORMAL LOW (ref 12.0–15.0)
MCV: 78.8 fL (ref 78.0–100.0)
RBC: 3.4 MIL/uL — ABNORMAL LOW (ref 3.87–5.11)
WBC: 14.6 10*3/uL — ABNORMAL HIGH (ref 4.0–10.5)

## 2011-05-04 LAB — BASIC METABOLIC PANEL
CO2: 31 mEq/L (ref 19–32)
Chloride: 97 mEq/L (ref 96–112)
GFR calc non Af Amer: >60 mL/min (ref >60)
Glucose, Bld: 108 mg/dL — ABNORMAL HIGH (ref 70–99)
Potassium: 3.6 mEq/L (ref 3.5–5.1)
Sodium: 135 mEq/L (ref 135–145)

## 2011-05-14 DIAGNOSIS — IMO0001 Reserved for inherently not codable concepts without codable children: Secondary | ICD-10-CM

## 2011-05-14 DIAGNOSIS — Z5189 Encounter for other specified aftercare: Secondary | ICD-10-CM

## 2011-05-14 HISTORY — DX: Encounter for other specified aftercare: Z51.89

## 2011-05-14 HISTORY — DX: Reserved for inherently not codable concepts without codable children: IMO0001

## 2011-05-16 ENCOUNTER — Observation Stay (HOSPITAL_COMMUNITY)
Admission: EM | Admit: 2011-05-16 | Discharge: 2011-05-19 | Disposition: A | Discharge status: Home / Self Care | Payer: Medicare Other | Attending: Internal Medicine | Admitting: Internal Medicine

## 2011-05-16 DIAGNOSIS — E871 Hypo-osmolality and hyponatremia: Secondary | ICD-10-CM | POA: Insufficient documentation

## 2011-05-16 DIAGNOSIS — R197 Diarrhea, unspecified: Secondary | ICD-10-CM | POA: Insufficient documentation

## 2011-05-16 DIAGNOSIS — R509 Fever, unspecified: Secondary | ICD-10-CM | POA: Insufficient documentation

## 2011-05-16 DIAGNOSIS — Z96659 Presence of unspecified artificial knee joint: Secondary | ICD-10-CM | POA: Insufficient documentation

## 2011-05-16 DIAGNOSIS — I10 Essential (primary) hypertension: Secondary | ICD-10-CM | POA: Insufficient documentation

## 2011-05-16 DIAGNOSIS — E876 Hypokalemia: Secondary | ICD-10-CM | POA: Insufficient documentation

## 2011-05-16 DIAGNOSIS — M25069 Hemarthrosis, unspecified knee: Secondary | ICD-10-CM | POA: Insufficient documentation

## 2011-05-16 DIAGNOSIS — R748 Abnormal levels of other serum enzymes: Secondary | ICD-10-CM | POA: Insufficient documentation

## 2011-05-16 DIAGNOSIS — D72829 Elevated white blood cell count, unspecified: Secondary | ICD-10-CM | POA: Insufficient documentation

## 2011-05-16 DIAGNOSIS — M1990 Unspecified osteoarthritis, unspecified site: Secondary | ICD-10-CM | POA: Insufficient documentation

## 2011-05-16 DIAGNOSIS — I951 Orthostatic hypotension: Principal | ICD-10-CM | POA: Insufficient documentation

## 2011-05-16 DIAGNOSIS — R791 Abnormal coagulation profile: Secondary | ICD-10-CM | POA: Insufficient documentation

## 2011-05-16 DIAGNOSIS — M25469 Effusion, unspecified knee: Secondary | ICD-10-CM | POA: Insufficient documentation

## 2011-05-16 DIAGNOSIS — D473 Essential (hemorrhagic) thrombocythemia: Secondary | ICD-10-CM | POA: Insufficient documentation

## 2011-05-16 DIAGNOSIS — D62 Acute posthemorrhagic anemia: Secondary | ICD-10-CM | POA: Insufficient documentation

## 2011-05-16 LAB — FOLATE: Folate: 14.2 ng/mL

## 2011-05-16 LAB — URINALYSIS, ROUTINE W REFLEX MICROSCOPIC
Glucose, UA: NEGATIVE mg/dL
Ketones, ur: NEGATIVE mg/dL
Leukocytes, UA: NEGATIVE
pH: 7.5 (ref 5.0–8.0)

## 2011-05-16 LAB — PATHOLOGIST SMEAR REVIEW

## 2011-05-16 LAB — COMPREHENSIVE METABOLIC PANEL
ALT: 60 U/L — ABNORMAL HIGH (ref 0–35)
AST: 48 U/L — ABNORMAL HIGH (ref 0–37)
AST: 34 U/L (ref 0–37)
Albumin: 3 g/dL — ABNORMAL LOW (ref 3.5–5.2)
Albumin: 2.7 g/dL — ABNORMAL LOW (ref 3.5–5.2)
Alkaline Phosphatase: 268 U/L — ABNORMAL HIGH (ref 39–117)
Alkaline Phosphatase: 230 U/L — ABNORMAL HIGH (ref 39–117)
Chloride: 94 mEq/L — ABNORMAL LOW (ref 96–112)
Chloride: 97 mEq/L (ref 96–112)
Creatinine, Ser: 0.76 mg/dL (ref 0.50–1.10)
Potassium: 3.5 mEq/L (ref 3.5–5.1)
Potassium: 3.4 mEq/L — ABNORMAL LOW (ref 3.5–5.1)
Sodium: 131 mEq/L — ABNORMAL LOW (ref 135–145)
Total Bilirubin: 0.9 mg/dL (ref 0.3–1.2)
Total Bilirubin: 0.8 mg/dL (ref 0.3–1.2)
Total Protein: 7.7 g/dL (ref 6.0–8.3)

## 2011-05-16 LAB — DIFFERENTIAL
Basophils Absolute: 0 10*3/uL (ref 0.0–0.1)
Basophils Relative: 0 % (ref 0–1)
Lymphocytes Relative: 17 % (ref 12–46)
Monocytes Relative: 7 % (ref 3–12)
Neutro Abs: 12.6 10*3/uL — ABNORMAL HIGH (ref 1.7–7.7)

## 2011-05-16 LAB — CBC
Hemoglobin: 8.7 g/dL — ABNORMAL LOW (ref 12.0–15.0)
MCH: 25.4 pg — ABNORMAL LOW (ref 26.0–34.0)
RBC: 3.43 MIL/uL — ABNORMAL LOW (ref 3.87–5.11)

## 2011-05-16 LAB — IRON AND TIBC
Iron: 23 ug/dL — ABNORMAL LOW (ref 42–135)
Saturation Ratios: 11 % — ABNORMAL LOW (ref 20–55)
TIBC: 217 ug/dL — ABNORMAL LOW (ref 250–470)

## 2011-05-16 LAB — PROTIME-INR: INR: 2.2 — ABNORMAL HIGH (ref 0.00–1.49)

## 2011-05-16 LAB — ABO/RH: ABO/RH(D): O POS

## 2011-05-16 LAB — RETICULOCYTES: RBC.: 3.2 MIL/uL — ABNORMAL LOW (ref 3.87–5.11)

## 2011-05-16 LAB — OCCULT BLOOD, POC DEVICE: Fecal Occult Bld: NEGATIVE

## 2011-05-16 LAB — APTT: aPTT: 54 seconds — ABNORMAL HIGH (ref 24–37)

## 2011-05-17 ENCOUNTER — Observation Stay (HOSPITAL_COMMUNITY): Payer: Medicare Other

## 2011-05-17 DIAGNOSIS — M79609 Pain in unspecified limb: Secondary | ICD-10-CM

## 2011-05-17 LAB — URINE CULTURE: Colony Count: 2000

## 2011-05-17 LAB — CBC
HCT: 27.2 % — ABNORMAL LOW (ref 36.0–46.0)
Hemoglobin: 9.2 g/dL — ABNORMAL LOW (ref 12.0–15.0)
RBC: 3.44 MIL/uL — ABNORMAL LOW (ref 3.87–5.11)
RDW: 14.3 % (ref 11.5–15.5)
WBC: 11.5 10*3/uL — ABNORMAL HIGH (ref 4.0–10.5)

## 2011-05-17 LAB — BASIC METABOLIC PANEL
BUN: 14 mg/dL (ref 6–23)
Chloride: 99 mEq/L (ref 96–112)
GFR calc Af Amer: >60 mL/min (ref >60)
Potassium: 3.6 mEq/L (ref 3.5–5.1)

## 2011-05-17 MED ORDER — IOHEXOL 300 MG/ML  SOLN
100.0000 mL | Freq: Once | INTRAMUSCULAR | Status: AC | PRN
  Administered 2011-05-17: 100 mL via INTRAVENOUS

## 2011-05-18 LAB — URINALYSIS, DIPSTICK ONLY
Bilirubin Urine: NEGATIVE
Ketones, ur: NEGATIVE mg/dL
Nitrite: NEGATIVE
Protein, ur: NEGATIVE mg/dL
pH: 5 (ref 5.0–8.0)

## 2011-05-18 LAB — DIC (DISSEMINATED INTRAVASCULAR COAGULATION)PANEL
D-Dimer, Quant: 9.76 ug/mL-FEU — ABNORMAL HIGH (ref 0.00–0.48)
INR: 1.48 (ref 0.00–1.49)
Platelets: 1124 10*3/uL (ref 150–400)
Prothrombin Time: 18.2 seconds — ABNORMAL HIGH (ref 11.6–15.2)
aPTT: 41 seconds — ABNORMAL HIGH (ref 24–37)

## 2011-05-18 LAB — COMPREHENSIVE METABOLIC PANEL
Albumin: 2.6 g/dL — ABNORMAL LOW (ref 3.5–5.2)
Alkaline Phosphatase: 182 U/L — ABNORMAL HIGH (ref 39–117)
BUN: 10 mg/dL (ref 6–23)
CO2: 24 mEq/L (ref 19–32)
Chloride: 104 mEq/L (ref 96–112)
Creatinine, Ser: 0.65 mg/dL (ref 0.50–1.10)
GFR calc Af Amer: >60 mL/min (ref >60)
GFR calc non Af Amer: >60 mL/min (ref >60)
Glucose, Bld: 95 mg/dL (ref 70–99)
Potassium: 4.5 mEq/L (ref 3.5–5.1)
Total Bilirubin: 0.6 mg/dL (ref 0.3–1.2)

## 2011-05-18 LAB — CBC
MCH: 26.9 pg (ref 26.0–34.0)
MCHC: 33.7 g/dL (ref 30.0–36.0)

## 2011-05-19 LAB — CBC
HCT: 27 % — ABNORMAL LOW (ref 36.0–46.0)
Hemoglobin: 8.8 g/dL — ABNORMAL LOW (ref 12.0–15.0)
MCHC: 32.6 g/dL (ref 30.0–36.0)
MCV: 80.8 fL (ref 78.0–100.0)
WBC: 11.3 10*3/uL — ABNORMAL HIGH (ref 4.0–10.5)

## 2011-05-19 LAB — BASIC METABOLIC PANEL
BUN: 12 mg/dL (ref 6–23)
Chloride: 101 mEq/L (ref 96–112)
Creatinine, Ser: 0.67 mg/dL (ref 0.50–1.10)
Glucose, Bld: 100 mg/dL — ABNORMAL HIGH (ref 70–99)
Potassium: 4.2 mEq/L (ref 3.5–5.1)

## 2011-05-19 LAB — OSMOLALITY: Osmolality: 277 mOsm/kg (ref 275–300)

## 2011-05-19 LAB — APTT: aPTT: 40 seconds — ABNORMAL HIGH (ref 24–37)

## 2011-05-20 LAB — CROSSMATCH: Unit division: 0

## 2011-05-20 LAB — URINE CULTURE: Special Requests: NEGATIVE

## 2011-05-20 LAB — HAPTOGLOBIN: Haptoglobin: 10 mg/dL — ABNORMAL LOW (ref 30–200)

## 2011-05-20 NOTE — Discharge Summary (Signed)
Jennifer [name], Jennifer [name]                ACCOUNT NO.:  [id number]  MEDICAL RECORD NO.:  [id number]  LOCATION:  1345                         FACILITY:  [facility]  PHYSICIAN:  Hillery Aldo, M.D.   DATE OF BIRTH:  [birth year]  DATE OF ADMISSION:  05/16/2011 DATE OF DISCHARGE:  05/19/2011                              DISCHARGE SUMMARY   PRIMARY CARE PHYSICIAN:  Dr. Philemon Kingdom in [place].  ORTHOPEDIC SURGEON:  Dr. Marcene Corning  DISCHARGE DIAGNOSES: 1. Near syncope secondary to orthostatic hypotension and acute blood     loss anemia. 2. Coagulopathy. 3. Thrombocytosis. 4. Leukocytosis. 5. Hypokalemia. 6. Hyponatremia. 7. Elevated alkaline phosphatase. 8. Possible mild cellulitis at operative site.  DISCHARGE MEDICATIONS: 1. Keflex 500 mg p.o. t.i.d. x7 days. 2. Hydrochlorothiazide 25 mg p.o. daily. 3. Percocet 5/325 one to two tablets p.o. q.6 h. p.r.n.  CONSULTATIONS:  Dr. Jerl Santos of Orthopedic Surgery.  BRIEF ADMISSION HPI:  The patient is a 50 year old female who underwent a right total knee replacement on May 01, 2011 and who presented to the hospital with a chief complaint of fever and near-syncope.  The patient had no frank loss of consciousness, but was very dizzy and had near fainting spells.  Upon initial evaluation in the Emergency Department, she was noted to have a significant drop in her hemoglobin over baseline values and leukocytosis, and she subsequently was referred to the hospitalist service for further evaluation and treatment.  For the full details, please see my dictated H and P.  PROCEDURES AND DIAGNOSTIC STUDIES: 1. Right lower extremity Doppler done on May 17, 2011 showed no     evidence of DVT. 2. CT scan of the right knee with contrast on May 18, 2011 showed a     large knee joint effusion status post reported recent total knee     arthroplasty.  The effusion had central heterogeneous density with     mild synovial enhancement.  There was  hemarthrosis and joint     infection could not be completely excluded.  Mild subcutaneous     edema and ill-defined fluid with no focal soft tissue abscess     demonstrated.  No evidence of acute fracture or osteomyelitis.  DISCHARGE LABORATORY VALUES:  PT was 16.2, PTT 40, sodium 132, potassium 4.2, chloride 101, bicarb 25, BUN 12, creatinine 0.67, glucose 100, and calcium 9.1.  White blood cell count was 11.3, hemoglobin 8.8, hematocrit 27, and platelets 1122.  Serum osmolality was 277.  Urine osmolality was 429.  Random urine sodium was 111.  A DIC panel was negative for schistocytes.  Fibrinogen was 677.  D-dimer was 9.76. Blood cultures were negative.  TSH was 1.418.  Reticulocyte count was 3.7 with an absolute reticulocyte count of 118.4.  An anemia panel showed iron of 23, total iron binding capacity 217, 11% saturation, urine iron-binding capacity 194, B12 702, folate 14.2, and ferritin of 1284.  Fecal occult blood testing was negative.  HOSPITAL COURSE BY PROBLEM: 1. Near syncope secondary to a combination of orthostatic hypotension     and acute blood loss anemia:  The patient was admitted and     orthostatics were checked.  She was  found to be significantly     symptomatic and orthostatic with changes in position.  She was     gently hydrated and her hydrochlorothiazide was held.  Given her     significant symptoms and drop in her hemoglobin over baseline, she     was also transfused with 1 unit packed red blood cells.  At this     point, her near syncope and dizziness have resolved.  She is     instructed to follow up with her primary care physician for a     repeat CBC in 1 week's time to ensure that her blood counts are     improving. 2. Acute blood loss anemia:  The patient had no evidence of GI     bleeding.  Her hemoglobin and hematocrit drop were felt to be     secondary to her recent operation.  Again, she was given a unit of     packed red blood cells.  An anemia  panel did not show any specific     deficiency state, although she was low in iron, her ferritin was     high.  At this point, if she remains anemic after recheck by her     PCP, could consider iron therapy. 3. Coagulopathy:  The patient had significant aberration of her     coagulation studies done on presentation.  These were noted to be     normal preoperatively.  Because of this, DIC panel was checked and     she was not felt to have concurrent DIC.  Her coagulations studies     were monitored closely and are improving over time. 4. Thrombocytosis:  The patient has a marked elevation of her platelet     count which was normal preoperatively.  I did call Dr. Donnie Coffin and     discussed the patient's platelet count with him and he felt this     was most likely due to reactivity from her recent surgery.  The     patient will need a followup CBC to ensure that her platelet count     is improving, and if it remains elevated, consider outpatient     Hematology followup. 5. Hypokalemia:  The patient's potassium was fully repleted. 6. Hyponatremia:  The patient's sodium was 131 on admission.  Her     sodium is normal preoperatively.  Serum osmolality was checked and     found to be within normal limits.  Urine osmolality was likewise     within normal limits.  Her hydrochlorothiazide was held and her     sodium improved to a discharge value of 132.  Outpatient followup     with her PCP recommended for ongoing electrolyte monitoring.  Of     note, her thyroid function was normal. 7. Elevated alkaline phosphatase:  Felt to be secondary to surgery as     the patient did not have any abdominal complaints.  Over the course     of her hospital stay, it has declined. 8. Questionable cellulitis at right total knee replacement operative     site:  The patient did not have any initial complaints of wound     dehiscence or other issues concerning for infection on initial     inspection.  The wound edges  were well approximated with no     drainage.  Over the course of her hospital stay and after starting     physical therapy, the patient  did develop some mild bloody drainage     in the lower part of her incision.  She also developed some very     mild erythema with tiny blisters in the upper part of her incision.     I did touch base with Dr. Jerl Santos prior to the patient's     discharge, and he felt that she could be discharged on a week's     worth of Keflex.  He felt that if she did have an infection of the     simple cellulitis and not deeper wound infection.  Nevertheless,     the patient is encouraged to follow up with Dr. Jerl Santos and to     call him if she develops any worsening fevers, increased swelling,     redness, or drainage of her incision site.  DISPOSITION:  The patient is medically stable and be discharged home.  CONDITION ON DISCHARGE:  Improved.  DISCHARGE INSTRUCTIONS: 1. Activity:  Increase activity slowly.  Home physical therapy for     progressive ambulation. 2. Diet:  Low-sodium, heart-healthy. 3. Wound care:  Call Dr. Jerl Santos for any fever, increased swelling,     redness, or drainage of the operative site.  FOLLOWUP: 1. Follow up with Dr. Sudie Bailey in 1 week for a repeat CBC and BMET. 2. Follow up with Dr. Jerl Santos as recommended or sooner if needed.     Call as noted above for any changes in incision site.  Time spent coordinating care for discharge and discharge instructions occluding face-to-face time equals approximately 35 minutes.     Hillery Aldo, M.D.     CR/MEDQ  D:  05/19/2011  T:  05/20/2011  Job:  [id number]  cc:   Philemon Kingdom, MD Fax: [phone]  Lubertha Basque. Jerl Santos, M.D. Fax: [phone]  Electronically Signed by Hillery Aldo M.D. on 05/20/2011 07:13:43 AM

## 2011-05-20 NOTE — H&P (Signed)
Jennifer [name], Jennifer [name]                ACCOUNT NO.:  [id number]  MEDICAL RECORD NO.:  [id number]  LOCATION:  1345                         FACILITY:  [facility]  PHYSICIAN:  Hillery Aldo, M.D.   DATE OF BIRTH:  1961/09/27  DATE OF ADMISSION:  05/16/2011 DATE OF DISCHARGE:                             HISTORY & PHYSICAL   PRIMARY CARE PHYSICIAN:  Philemon Kingdom, M.D., in [place].  ORTHOPEDIC SURGEON:  Lubertha Basque. Jerl Santos, M.D.  CHIEF COMPLAINT:  Near-syncope, fever.  HISTORY OF PRESENT ILLNESS:  The patient is a 50 year old female who is 2 weeks postoperative from a right total knee replacement performed by Dr. Jerl Santos on May 01, 2011.  The patient reports that since her discharge from the hospital, she has had progressive weakness and presyncopal symptoms including dizziness and near-fainting.  The patient reports that she was at physical therapy today and developed significant presyncope, ultimately had to be lowered to the floor to avoid falling. The patient denies any associated chest pain, dyspnea, melena, or hematochezia.  She has some chronic back pain and some right knee pain associated with her postoperative status.  She does report that there has been no significant swelling of the knee, but no drainage from her operative wound site.  Upon initial evaluation in the emergency department, the patient is noted to have a significant drop in her hemoglobin over baseline values, leukocytosis, and concerns for an infection in the right knee and therefore she has been referred to the hospitalist service for further evaluation and treatment.  PAST MEDICAL HISTORY: 1. Hypertension. 2. Osteoarthritis.  PAST SURGICAL HISTORY: 1. Left-sided L5-S1 microdiskectomy with exploration of disk right L5-     S1 in October 2003. 2. Laparoscopic cholecystectomy with intraoperative cholangiography in     August 2006. 3. Anterior cervical diskectomy C5-6, anterior cervical arthrodesis,  C5-6 with placement of allograft prosthesis pacer past with local     autogenous bone graft.  Anterior cervical plating, C5-6 using the     Abbott spine system done in November 2006. 4. Revision L5-S1 laminectomy with decompression of the thecal sac and     nerve roots bilaterally.  L5-S1 transforaminal lumbar inner body     fusion with placement of 9 mm peek cage; L5-S1 pedicle screw     instrumentation using the Abbott spine system.  Posterior spinal     fusion L5-S1.  Local autogenous bone graft supplemented with bone     morphogenic protein. 5. Incision and drainage of lumbar wound done July 22, 2006. 6. Right total knee replacement on May 01, 2011. 7. History of partial hysterectomy. 8. History of arthroscopic knee surgery.  FAMILY HISTORY:  The patient's mother died at 62 from heart failure. The patient's father died at 68 from prostate cancer.  She has 4 brothers and 1 sister who are reportedly healthy.  SOCIAL HISTORY:  The patient is separated, lives alone.  She is a lifelong nonsmoker.  Denies alcohol or drug use.  She is disabled secondary to her back injuries, but previously worked for a [employer].  ALLERGIES: 1. DARVOCET causes lip swelling. 2. She is also allergic to LODINE and METHOCARBAMOL.  CURRENT MEDICATIONS: 1. Hydrochlorothiazide 25  mg p.o. daily. 2. Percocet 5/325 mg 1-2 tablets p.o. q.4 h. p.r.n.  REVIEW OF SYSTEMS:  CONSTITUTIONAL:  Positive for fever, weight loss, and diminished appetite.  HEENT:  Wears glasses.  CARDIOVASCULAR:  No chest pain or palpitations.  RESPIRATORY:  Shortness of breath or cough. GASTROINTESTINAL:  No nausea or vomiting.  Positive diarrhea yesterday for several episodes.  No melena or hematochezia.  GENITOURINARY:  No dysuria or hematuria.  MUSCULOSKELETAL:  Chronic back pain.  Right knee pain.  NEUROLOGIC:  Positive presyncope.  No focal weakness.  PHYSICAL EXAMINATION:  GENERAL:  A well-developed,  well-nourished [place] female, in no acute distress. VITAL SIGNS:  Temperature 98.2, pulse 88, respirations 12, blood pressure 105/69, O2 saturations 97% on room air. HEENT:  Normocephalic, atraumatic.  PERRL.  EOMI.  Oropharynx is clear. NECK:  Supple, no thyromegaly, no lymphadenopathy, no jugular venous distention. CHEST:  Lungs clear to auscultation bilaterally with good air movement. HEART:  Regular rate, rhythm.  No murmurs, rubs, or gallops. ABDOMEN:  Soft, nontender, and nondistended with normoactive bowel sounds. EXTREMITIES:  The patient has significant swelling around the right knee.  The incision site appears to be well approximated with no purulent drainage and Steri-Strips intact.  Knee is hot-to-touch, but no frank infection. SKIN:  Warm and dry. NEUROLOGIC:  Alert and oriented x3.  Cranial nerves II through XII grossly intact.  Nonfocal.  DATA REVIEW:  Sodium is 133, potassium 3.4, chloride 97, bicarbonate 26, BUN 21, creatinine 0.76, glucose 89, calcium 8.9.  Total bilirubin is 0.8, alkaline phosphatase 230, AST 34, ALT 51, total protein 7.7, albumin 2.7.  Fecal occult blood testing is negative.  White blood cell count of 16.6, hemoglobin 8.7, hematocrit 26.8, platelets 1403.  ASSESSMENT AND PLAN: 1. Near syncope:  This is most likely multifactorial and related to     her acute blood loss anemia and episodes of diarrhea in the setting     of ongoing hydrochlorothiazide use.  We will admit her and monitor     her closely while providing IV fluids. 2. Acute blood loss anemia:  Certainly, this is the most likely     etiology of her anemia.  Her hemoglobin was normal on April 22, 2011, and has gradually come down since that time.  The patient is     status post hysterectomy and is Hemoccult negative, so other     sources of bleeding are not likely to be contributory.  She does     have a very large knee and I question whether or not there might be     a  large hematoma in that area.  We have requested orthopedic     consultation and we will defer any further workup to the orthopedic     physician. In the meantime, we will give her unit of blood since     she is symptomatic. 3. Thrombocytosis:  This is most clearly reactive.  Her platelet count     has been normal presurgery.  I did discuss her platelet count with     the hematologist on-call and he agrees that this is likely     reactive.  At this point, would not start Lovenox until we rule out     that she has a large hematoma, but will defer imaging versus     aspiration of the knee joint to the orthopedic specialist. 4. Leukocytosis or may simply be inflammatory versus postoperative  versus an operative infection.  There is no evidence of infection     based on her skin exam.  She may need further imaging to rule out     infection involving the prosthesis or surrounding tissues verses a     aspiration of her knee joint to rule out septic arthritis. 5. Diarrhea:  Suspect this is secondary to infection.  If she has     persistent diarrhea, would consider stool studies, but I do not     think these are indicated at this time. 6. Hypokalemia:  Replete the patient's potassium in her IV fluids. 7. Mild hyponatremia:  We will monitor.  We will hydrate gently. 8. Elevated alkaline phosphatase:  This is most likely from bone given     her recent surgery.  She is status post cholecystectomy. 9. Prophylaxis:  We will hold on Lovenox since she may have a large     hematoma with ongoing bleeding into the knee.  We will check a     lower extremity Doppler to rule out deep vein thrombosis in that     area and if this is negative, with start PAS hoses.  Time spent on admission including face-to-face time equals approximately 1 hour.     Hillery Aldo, M.D.     CR/MEDQ  D:  05/16/2011  T:  05/17/2011  Job:  [id number]  cc:   Philemon Kingdom, MD Fax: [phone]  Lubertha Basque. Jerl Santos,  M.D. Fax: [phone]  Electronically Signed by Hillery Aldo M.D. on 05/20/2011 07:13:34 AM

## 2011-05-22 LAB — CULTURE, BLOOD (ROUTINE X 2)
Culture  Setup Time: 201208032342
Culture: NO GROWTH

## 2011-05-28 NOTE — Discharge Summary (Signed)
Jennifer [name], [facility]                ACCOUNT NO.:  [id number]  MEDICAL RECORD NO.:  [id number]  LOCATION:  5029                         FACILITY:  [facility]  PHYSICIAN:  Lubertha Basque. Macaria Bias, M.D.DATE OF BIRTH:  [birth year]  DATE OF ADMISSION:  05/01/2011 DATE OF DISCHARGE:  05/04/2011                              DISCHARGE SUMMARY   ADMITTING DIAGNOSES: 1. Right knee end-stage bone-on-bone degenerative joint disease. 2. History of hypertension.  DISCHARGE DIAGNOSES: 1. Right knee end-stage bone-on-bone degenerative joint disease. 2. History of hypertension.  OPERATIONS:  Right total knee replacement.  BRIEF HISTORY:  Ms. Jennifer [name] is a patient well-known to our practice, had increasing discomfort in the right knee which she ambulates, trouble sleeping at night time, she has failed antiinflammatory medications, corticosteroid injections, and x-rays revealed bone-on-bone DJD.  We decided to proceed with total knee replacement of the right knee.  Risk of anesthesia, infection, DVT, possible death have been discussed with the patient.  PERTINENT LABORATORY AND X-RAY FINDINGS:  Last hemoglobin 9.2, hematocrit 26.8, WBCs 14.6, sodium 135, potassium 3.6 and this was supplemented after drop to 3.0, BUN 7, creatinine 0.73, glucose 108, and last INR 2.35.  COURSE IN THE HOSPITAL:  She was admitted postoperatively.  The preoperative-written order sheet was used.  Advance her diet as tolerated.  Implemented total joint replacement protocol and Orthopedic p.r.n. orders.  Diet as stated.  PCA pump not used.  Lovenox and Coumadin per DVT prophylaxis per pharmacy along with knee-high TEDs, incentive spirometry and activity, prescribed to physical therapy, weightbearing as tolerated.  CPM machine was also used 0 to 50 degrees 6 hours a day, advancing as tolerated. Then, appropriate antiemetics, antispasmodics, and pain medicines were given.  Once again she could be weightbearing as tolerated.   Total length of time for Coumadin will be 2 weeks.  Foley catheter was used to the first day 24 hours and then discontinued.  She was able to void well on her own.  The first day postop, her vital signs were stable, 120/66 blood pressure, temperature 98, equal breath sounds and all lung fields were auscultated, and abdomen was soft with positive bowel sounds, right knee was doing well.  Good neurovascular status.  Drain was in place. Second day postop, her vital signs and breath sounds and abdomen all continued to do well and drain was pulled.  Dressing was changed.  Wound was noted to be benign.  Calf soft and nontender.  She was worked well with physical therapy.  The third day, she progressed to the point that she was able to be discharged home.  CONDITION ON DISCHARGE:  Improved.  FOLLOWUP:  She will remain on a low-sodium heart-healthy diet, would be weightbearing as tolerated.  [facility] has been arranged for home physical therapy and blood draws for INR with tentative target between 2.0 and 3.0 for total of 14 days.  Any sign of infection which will be increasing redness, drainage, increasing pain, call our office, otherwise we would see her on August 1 for staple removal and followup.  Medicines are outlined on the medicine discharge management sheet III, prescription was given today besides her home medicines, Percocet and Coumadin for  14 days, and Robaxin as muscle relaxer.     Lindwood Qua, P.A.   ______________________________ Lubertha Basque. Jerl Santos, M.D.    MC/MEDQ  D:  05/04/2011  T:  05/04/2011  Job:  [id number]  Electronically Signed by Lindwood Qua P.A. on 05/13/2011 02:17:28 PM Electronically Signed by Marcene Corning M.D. on 05/28/2011 08:35:05 PM

## 2011-05-28 NOTE — Op Note (Signed)
Jennifer [name], Jennifer [name]                ACCOUNT NO.:  [id number]  MEDICAL RECORD NO.:  [id number]  LOCATION:  5029                         FACILITY:  [facility]  PHYSICIAN:  Lubertha Basque. Inola Lisle, M.D.DATE OF BIRTH:  06-27-1961  DATE OF PROCEDURE:  05/01/2011 DATE OF DISCHARGE:                              OPERATIVE REPORT   PREOPERATIVE DIAGNOSIS:  Right knee degenerative joint disease.  POSTOPERATIVE DIAGNOSIS:  Right knee degenerative joint disease.  PROCEDURE:  Right total knee replacement.  ANESTHESIA:  General and block.  ATTENDING SURGEON:  Lubertha Basque. Jerl Santos, MD  ASSISTANT:  Lindwood Qua, PA   INDICATION FOR PROCEDURE:  The patient is a 50 year old woman with a long history of right knee pain.  This has persisted despite oral anti- inflammatories and injections as well as an arthroscopy.  She has advanced degenerative change.  She has pain, which limits her ability to rest and walk and she is offered a knee replacement.  Informed operative consent was obtained after discussion of possible complications including reaction to anesthesia, infection, DVT, PE, and death.  The importance of the postoperative rehabilitation protocol to optimize result was stressed extensively with the patient.  SUMMARY/FINDINGS/PROCEDURE:  Under general anesthesia and a block, a right knee replacement was performed.  She had advanced degenerative change, patellofemoral and lateral with a moderate valgus deformity. We addressed her problem with a cemented DePuy system using a standard femur, 10 Deep Dish Insert, 35 all-polyethylene patella, and a size tibial tray to address her stature and deformity.  I did include Zinacef antibiotic in the cement.  Lindwood Qua, PA assisted throughout and was invaluable to the completion of the case in that he helped position and retract while I performed the procedure.  He also closed simultaneously to help minimize OR time.  She was closed primarily  and admitted for appropriate postop care to include perioperative antibiotics and Coumadin plus Lovenox for DVT prophylaxis.  DESCRIPTION OF THE PROCEDURE:  The patient was taken to the operating suite where general anesthetic was applied without difficulty.  She was also given a block in the preanesthesia area.  She was positioned supine, and prepped and draped in normal sterile fashion.  After the administration of IV Kefzol, the right leg was elevated, exsanguinated, and tourniquet inflated about the thigh.  A longitudinal anterior incision was made with dissection down to the extensor mechanism.  All appropriate anti-infective measures were used including closed hooded exhaust systems for each member of the surgical team, Betadine impregnated drape, and the preoperative IV antibiotic.  A medial parapatellar incision was made.  The kneecap was flipped and the knee flexed.  Some residual meniscal tissues were removed along with the ACL and PCL.  An intramedullary guide was placed in the tibia to make a roughly flat cut.  A second intramedullary guide was placed in the femur to make anterior and posterior cuts creating a flexion gap of 10 mm. Another intramedullary guide was placed in the femur to make a distal cut correcting the valgus deformity and balancing the knee with a 10 degrees block.  The femur sized to a standard and the tibia was 2 or 3 with appropriate guides placed  and utilized.  We did ream for the short stem for the MBT tibial tray.  The patella was cut down in thickness by about 10 mm to 14 and sized to 35 with appropriate guide placed and utilized.  Trial reduction was done with all these components.  She easily came to slight hyperextension and flexed well.  The patella tracked well.  Cement was mixed including the antibiotic.  This was pressurized onto the bones which had been cleaned with a pulsatile lavage and dried thoroughly.  The aforementioned components were  then placed.  Excess cement was trimmed and pressure was held on the components while the cement hardened.  The tourniquet was deflated and a small amount of bleeding was easily controlled with Bovie cautery and some pressure.  The wound was irrigated followed by placement of a drain exiting superolaterally.  The extensor mechanism was reapproximated with #1 Vicryl in interrupted fashion followed by subcutaneous reapproximation with 0 and 2-0 undyed Vicryl and skin closure with staples.  Adaptic was applied followed by dry gauze and a loose Ace wrap.  Estimated blood loss and intraoperative fluids obtained from anesthesia records as can accurate tourniquet time.  DISPOSITION:  The patient was extubated in the operating room and taken to the recovery room in stable condition.  She was admitted to the Orthopedic Surgery Service, appropriate postop care to include perioperative antibiotics and Coumadin plus Lovenox for DVT prophylaxis.     Lubertha Basque Jerl Santos, M.D.     PGD/MEDQ  D:  05/01/2011  T:  05/01/2011  Job:  [id number]  Electronically Signed by Marcene Corning M.D. on 05/28/2011 08:36:37 PM

## 2011-05-28 NOTE — H&P (Signed)
  NAMEJORGE, Jennifer [name]                ACCOUNT NO.:  [id number]  MEDICAL RECORD NO.:  [id number]  LOCATION:                                 FACILITY:  PHYSICIAN:  Lubertha Basque. Karsyn Rochin, M.D.DATE OF BIRTH:  [birth year]  DATE OF ADMISSION:05/01/2011 DATE OF DISCHARGE:05/04/2011                             HISTORY & PHYSICAL   CHIEF COMPLAINT:  Right knee pain.  HISTORY OF PRESENT ILLNESS:  Ms. Dulac is a patient well known to our practice, who is having increasing right knee discomfort when she ambulates and trouble sleeping at nighttime, unrelieved by oral anti- inflammatory medicines.  This patient has also had about three arthroscopies over the years on the right knee, and we have discussed further treatment options that being total knee replacement with the risk of anesthesia, infection, DVT, and possible death.  Her x-rays reveal bone-on-bone DJD as well.  She has drug allergies to LODINE and METHOCARBAMOL.  She has had a history of back pain treated by Dr. Noel Gerold.  No history of diabetes.  No cholesterol issues.  She does wear glasses and denies any hearing loss.  CURRENT MEDICATIONS:  Hydrochlorothiazide for hypertension along with other p.r.n. medicines.  Dr. Sudie Bailey in [place] is her PCP doctor, who provides medical coverage.  She does not smoke, does not drink alcohol.  FAMILY HISTORY:  Positive for diabetes.  Positive for hypertension. Negative for heart disease.  Negative for arthritis.  PHYSICAL EXAMINATION:  GENERAL:  She is oriented appropriately with speech and behavior. VITAL SIGNS:  Stable. HEENT:  Within normal limits.  No oropharynx obstructions.  Eyes: PERRLA.  No cervical adenopathy.  Negative bruits.  Cervical motion is full.  Upper extremity reflexes are 2+ biceps, triceps, brachioradialis, full shoulder, elbow, and wrist motion. LUNGS:  Clear to A and P. CARDIAC:  Regular rate and rhythm. ABDOMEN:  Soft.  Positive bowel sounds.  No spleen or liver  enlargement. No masses or tenderness noted. EXTREMITIES:  Her right knee motion is 0-110.  Calf soft and nontender. Stable ligamentous structures.  No sign of infection or irritation.  No significant effusion.  No increased heat.  Good neurovascular status in both lower extremities.  X-rays reveal bone-on-bone end-stage DJD of her right knee.  ASSESSMENT: 1. Bone-on-bone degenerative joint disease, right knee. 2. Hypertension.  PLAN:  At this point, we discussed treatment options with Aurther Loft that being total knee replacement and then we will proceed on with it.  Admit her to the hospital postoperatively for DVT prophylaxis, physical therapy, and pain control.     Lindwood Qua, P.A.   ______________________________ Lubertha Basque. Jerl Santos, M.D.    MC/MEDQ  D:  04/30/2011  T:  05/01/2011  Job:  [id number]  Electronically Signed by Lindwood Qua P.A. on 05/13/2011 02:19:23 PM Electronically Signed by Marcene Corning M.D. on 05/28/2011 08:36:41 PM

## 2011-07-14 ENCOUNTER — Encounter (HOSPITAL_BASED_OUTPATIENT_CLINIC_OR_DEPARTMENT_OTHER)
Admission: RE | Admit: 2011-07-14 | Discharge: 2011-07-14 | Disposition: A | Discharge status: Home / Self Care | Payer: Medicare Other | Source: Ambulatory Visit | Attending: Orthopaedic Surgery | Admitting: Orthopaedic Surgery

## 2011-07-14 LAB — BASIC METABOLIC PANEL
Chloride: 100 mEq/L (ref 96–112)
GFR calc Af Amer: >90 mL/min (ref >90)
Potassium: 3.2 mEq/L — ABNORMAL LOW (ref 3.5–5.1)

## 2011-07-15 ENCOUNTER — Ambulatory Visit (HOSPITAL_BASED_OUTPATIENT_CLINIC_OR_DEPARTMENT_OTHER)
Admission: RE | Admit: 2011-07-15 | Discharge: 2011-07-15 | Disposition: A | Discharge status: Home / Self Care | Payer: Medicare Other | Source: Ambulatory Visit | Attending: Orthopaedic Surgery | Admitting: Orthopaedic Surgery

## 2011-07-15 DIAGNOSIS — I10 Essential (primary) hypertension: Secondary | ICD-10-CM | POA: Insufficient documentation

## 2011-07-15 DIAGNOSIS — Z96659 Presence of unspecified artificial knee joint: Secondary | ICD-10-CM | POA: Insufficient documentation

## 2011-07-15 DIAGNOSIS — Z01812 Encounter for preprocedural laboratory examination: Secondary | ICD-10-CM | POA: Insufficient documentation

## 2011-07-15 DIAGNOSIS — M25669 Stiffness of unspecified knee, not elsewhere classified: Secondary | ICD-10-CM | POA: Insufficient documentation

## 2011-07-15 LAB — POCT HEMOGLOBIN-HEMACUE: Hemoglobin: 12.9 g/dL (ref 12.0–15.0)

## 2011-08-07 NOTE — Op Note (Signed)
  Jennifer [name], Jennifer [name]                ACCOUNT NO.:  [id number]  MEDICAL RECORD NO.:  [id number]  LOCATION:                                 FACILITY:  PHYSICIAN:  Lubertha Basque. Carmen Tolliver, M.D.DATE OF BIRTH:  [birth year]  DATE OF PROCEDURE:  07/15/2011 DATE OF DISCHARGE:                              OPERATIVE REPORT   PREOPERATIVE DIAGNOSIS:  Right knee stiffness.  POSTOPERATIVE DIAGNOSIS:  Right knee stiffness.  PROCEDURE:  Right knee closed manipulation.  ANESTHESIA:  General.  ATTENDING SURGEON:  Lubertha Basque. Jerl Santos, MD  ASSISTANT:  Lindwood Qua, PA   INDICATIONS FOR PROCEDURE:  The patient is a 50 year old woman a couple of months from right knee replacement.  Despite aggressive therapy and attempts on her own she has persisted with some significant stiffness. She has not been able to achieve 90 degrees of flexion.  She is offered a manipulation.  Informed operative consent was obtained after discussion of possible complications including reaction to anesthesia and fracture.  SUMMARY OF FINDINGS AND PROCEDURE:  Under general mask anesthetic, a right knee manipulation was performed.  We improved her motion from 15- 80 degree range up to 5-120 with audible pops.  I did inject sterilely at the end of the case with some Marcaine as well.  DESCRIPTION OF PROCEDURE:  The patient was taken to the operating suite where general anesthetic was applied by mask.  She was positioned supine.  With gentle pressure, we were able to improve her motion from an arc of 15-80 up to 5-120.  There were audible pops in both directions.  We then sterilely prepped with Betadine and alcohol and injected some Marcaine superolaterally.  Estimated blood loss and intraoperative fluids can be obtained from anesthesia records.  DISPOSITION:  The patient was taken to recovery in stable addition.  She was to go home the same-day and resume therapy tomorrow.  I will contact her by phone tonight.    Lubertha Basque Jerl Santos, M.D.    PGD/MEDQ  D:  07/15/2011  T:  07/15/2011  Job:  [id number]  Electronically Signed by Marcene Corning M.D. on 08/07/2011 01:40:13 PM

## 2011-10-12 ENCOUNTER — Ambulatory Visit (INDEPENDENT_AMBULATORY_CARE_PROVIDER_SITE_OTHER): Payer: Medicare Other

## 2011-10-12 DIAGNOSIS — J069 Acute upper respiratory infection, unspecified: Secondary | ICD-10-CM

## 2011-10-12 DIAGNOSIS — R509 Fever, unspecified: Secondary | ICD-10-CM

## 2011-10-12 DIAGNOSIS — R059 Cough, unspecified: Secondary | ICD-10-CM

## 2011-10-12 DIAGNOSIS — R05 Cough: Secondary | ICD-10-CM

## 2011-12-31 ENCOUNTER — Ambulatory Visit (INDEPENDENT_AMBULATORY_CARE_PROVIDER_SITE_OTHER): Payer: Medicare Other | Admitting: Physician Assistant

## 2011-12-31 VITALS — BP 104/71 | HR 94 | Temp 99.2°F | Resp 18 | Ht 65.0 in | Wt 196.0 lb

## 2011-12-31 DIAGNOSIS — R059 Cough, unspecified: Secondary | ICD-10-CM

## 2011-12-31 DIAGNOSIS — R05 Cough: Secondary | ICD-10-CM

## 2011-12-31 DIAGNOSIS — J069 Acute upper respiratory infection, unspecified: Secondary | ICD-10-CM

## 2011-12-31 DIAGNOSIS — R066 Hiccough: Secondary | ICD-10-CM

## 2011-12-31 MED ORDER — BENZONATATE 100 MG PO CAPS: ORAL_CAPSULE | ORAL | Status: AC

## 2011-12-31 MED ORDER — AMOXICILLIN 875 MG PO TABS: 1750.0000 mg | ORAL_TABLET | Freq: Two times a day (BID) | ORAL | Status: AC

## 2011-12-31 MED ORDER — GUAIFENESIN ER 1200 MG PO TB12: 1.0000 | ORAL_TABLET | Freq: Two times a day (BID) | ORAL | Status: DC | PRN

## 2011-12-31 NOTE — Progress Notes (Signed)
  Subjective:    Patient ID: Jennifer [name], female    DOB: 03-16-1961, 51 y.o.   MRN: [id number]  HPI  ST began on 3/17.  Yesterday developed cough, nasal congestion, sinus pressure, aches.  No GI/GU symptoms.  No rash.  No HA.  Chest sore with coughing.  Review of Systems As above.  No flu vaccine this season.    Objective:   Physical Exam Vital signs noted. Well-developed, well nourished BF who is awake, alert and oriented, in NAD. HEENT: [place]/AT, PERRL, EOMI.  Sclera and conjunctiva are clear.  EAC are patent, TMs are normal in appearance. Nasal mucosa is pink and moist, but congested. OP is clear. Neck: supple, non-tender, no lymphadenopathey, thyromegaly. Heart: RRR, no murmur Lungs: CTA Skin: warm and dry without rash.     Assessment & Plan:   1. Acute upper respiratory infections of unspecified site   2. Cough    Restart the Atrovent NS she has at home, 2 sprays Qnos BID.  Mucinex, Tessalon Perles.  If no improvement by 3/23, fill Amoxicillin.

## 2011-12-31 NOTE — Patient Instructions (Signed)
Restart the Atrovent (ipratropium bromide) nasal spray.  Use 2 sprays in each nostril twice each day.  Get lots of rest and drink at least 64 ounces of water daily.  Get the antibiotic if you have not begun to improve by Saturday.

## 2012-01-25 ENCOUNTER — Ambulatory Visit (INDEPENDENT_AMBULATORY_CARE_PROVIDER_SITE_OTHER): Payer: Medicare Other | Admitting: Internal Medicine

## 2012-01-25 VITALS — BP 121/76 | HR 79 | Temp 98.4°F | Resp 16 | Ht 65.5 in | Wt 201.0 lb

## 2012-01-25 DIAGNOSIS — R1032 Left lower quadrant pain: Secondary | ICD-10-CM

## 2012-01-25 DIAGNOSIS — B9689 Other specified bacterial agents as the cause of diseases classified elsewhere: Secondary | ICD-10-CM

## 2012-01-25 DIAGNOSIS — N760 Acute vaginitis: Secondary | ICD-10-CM

## 2012-01-25 LAB — POCT WET PREP WITH KOH: RBC Wet Prep HPF POC: NEGATIVE

## 2012-01-25 MED ORDER — POLYETHYLENE GLYCOL 3350 17 GM/SCOOP PO POWD: 17.0000 g | Freq: Two times a day (BID) | ORAL | Status: AC | PRN

## 2012-01-25 MED ORDER — METRONIDAZOLE 500 MG PO TABS: 500.0000 mg | ORAL_TABLET | Freq: Two times a day (BID) | ORAL | Status: AC

## 2012-01-25 MED ORDER — TRAMADOL HCL 50 MG PO TABS: ORAL_TABLET | ORAL | Status: DC

## 2012-01-25 NOTE — Progress Notes (Signed)
  Subjective:    Patient ID: Jennifer [name], female    DOB: [birth year], 51 y.o.   MRN: [id number]  HPIComplaining of left lower quadrant abdominal pain on and off for 4 weeks/she also notes dyspareunia in the same spot/she has had a vaginal discharge and treated herself with Diflucan which provided some relief. She noted a wetness and odor without specificirritation she has one partner for the last 6 months/Is not aware of other partners at this point She is status post partial hysterectomy 1995  Denies dysuria frequency urgency No fever    Review of SystemsHypertension with no current complaints No history of gastrointestinal problems with constipation or diarrhea  Although overweight no history of diabetes     Objective:   Physical ExamVital signs stable Abdominal exam negative except for pain to palpation in the left lower quadrant without obvious mass Pelvic exam= introitus is clear Back discharge in the vaginal vault No cervix is present Bimanual exam reveals tenderness in the left adnexa without mass Rectal exam equals copious formed stool in the colon with negative hemosure        Results for orders placed in visit on 01/25/12  POCT WET PREP WITH KOH      Component Value Range   Trichomonas, UA Negative     Clue Cells Wet Prep HPF POC 5-8     Epithelial Wet Prep HPF POC 1-3     Yeast Wet Prep HPF POC neg     Bacteria Wet Prep HPF POC 2+     RBC Wet Prep HPF POC neg     WBC Wet Prep HPF POC 3-6     KOH Prep POC Negative    IFOBT (OCCULT BLOOD)      Component Value Range   IFOBT Negative      Assessment & Plan:  Problem #1 bacterial vaginosis  Flagyl 500 twice a day for 7 days Problem #2 left lower quadrant pelvic pain  MiraLax 17 g twice a day as this may be secondary to constipation  Transvaginal ultrasound to rule out left adnexal mass  Okay for Tramadol at this point

## 2012-01-26 ENCOUNTER — Other Ambulatory Visit: Payer: Self-pay

## 2012-01-26 DIAGNOSIS — R102 Pelvic and perineal pain: Secondary | ICD-10-CM

## 2012-01-26 NOTE — Progress Notes (Signed)
Addended by: Fernande Bras on: 01/26/2012 10:34 AM   Modules accepted: Orders

## 2012-01-28 ENCOUNTER — Ambulatory Visit
Admission: RE | Admit: 2012-01-28 | Discharge: 2012-01-28 | Disposition: A | Discharge status: Home / Self Care | Payer: Medicare Other | Source: Ambulatory Visit | Attending: Physician Assistant | Admitting: Physician Assistant

## 2012-01-28 ENCOUNTER — Ambulatory Visit
Admission: RE | Admit: 2012-01-28 | Discharge: 2012-01-28 | Disposition: A | Discharge status: Home / Self Care | Payer: Medicare Other | Source: Ambulatory Visit | Attending: Internal Medicine | Admitting: Internal Medicine

## 2012-01-28 DIAGNOSIS — R1032 Left lower quadrant pain: Secondary | ICD-10-CM

## 2012-03-25 ENCOUNTER — Encounter: Payer: Self-pay | Admitting: Internal Medicine

## 2012-04-19 ENCOUNTER — Ambulatory Visit (AMBULATORY_SURGERY_CENTER): Payer: Medicare Other | Admitting: *Deleted

## 2012-04-19 VITALS — Ht 65.0 in | Wt 200.0 lb

## 2012-04-19 DIAGNOSIS — Z1211 Encounter for screening for malignant neoplasm of colon: Secondary | ICD-10-CM

## 2012-04-19 MED ORDER — MOVIPREP 100 G PO SOLR: ORAL | Status: DC

## 2012-04-30 ENCOUNTER — Encounter: Payer: Self-pay | Admitting: Internal Medicine

## 2012-04-30 ENCOUNTER — Ambulatory Visit (AMBULATORY_SURGERY_CENTER): Payer: Medicare Other | Admitting: Internal Medicine

## 2012-04-30 VITALS — BP 90/34 | HR 69 | Temp 98.8°F | Resp 18 | Ht 65.0 in | Wt 200.0 lb

## 2012-04-30 DIAGNOSIS — K5730 Diverticulosis of large intestine without perforation or abscess without bleeding: Secondary | ICD-10-CM

## 2012-04-30 DIAGNOSIS — Z1211 Encounter for screening for malignant neoplasm of colon: Secondary | ICD-10-CM

## 2012-04-30 MED ORDER — SODIUM CHLORIDE 0.9 % IV SOLN: 500.0000 mL | INTRAVENOUS | Status: DC

## 2012-04-30 NOTE — Op Note (Signed)
[facility] [address] [place], [place]  [address]  COLONOSCOPY PROCEDURE REPORT  PATIENT:  Jennifer, [name]  MR#:  [id number] BIRTHDATE:  [birth year], 51 yrs. old  GENDER:  female ENDOSCOPIST:  Iva Boop, MD, [facility] REF. BY:  Philemon Kingdom, M.D. PROCEDURE DATE:  04/30/2012 PROCEDURE:  Colonoscopy 95621 ASA CLASS:  Class II INDICATIONS:  Routine Risk Screening MEDICATIONS:   These medications were titrated to patient response per physician's verbal order, MAC sedation, administered by CRNA, propofol (Diprivan) 200 mg  DESCRIPTION OF PROCEDURE:   After the risks benefits and alternatives of the procedure were thoroughly explained, informed consent was obtained.  Digital rectal exam was performed and revealed no abnormalities.   The LB CF-H180AL [id number] endoscope was introduced through the anus and advanced to the cecum, which was identified by both the appendix and ileocecal valve, without limitations.  The quality of the prep was excellent, using MoviPrep.  The instrument was then slowly withdrawn as the colon was fully examined. <<PROCEDUREIMAGES>>  FINDINGS:  Severe diverticulosis was found in the sigmoid colon. Scattered diverticula were found in the right colon.  This was otherwise a normal examination of the colon. Includes right colon retroflexion.   Retroflexed views in the rectum revealed no abnormalities.    The time to cecum = 5:51 minutes. The scope was then withdrawn in 8:32 minutes from the cecum and the procedure completed. COMPLICATIONS:  None ENDOSCOPIC IMPRESSION: 1) Severe diverticulosis in the sigmoid colon 2) Diverticula, scattered in the right colon 3) Otherwise normal examination, excellent prep  REPEAT EXAM:  In 10 year(s) for routine screening colonoscopy. 2023  Iva Boop, MD, [facility]  CC:  Philemon Kingdom, MD and The Patient  n. eSIGNED:   Iva Boop at 04/30/2012 12:29 PM  Dercole, Camelia Eng, [phone]

## 2012-04-30 NOTE — Progress Notes (Signed)
Patient did not experience any of the following events: a burn prior to discharge; a fall within the facility; wrong site/side/patient/procedure/implant event; or a hospital transfer or hospital admission upon discharge from the facility. (G8907) Patient did not have preoperative order for IV antibiotic SSI prophylaxis. (G8918)  

## 2012-04-30 NOTE — Patient Instructions (Addendum)
No polyps or cancer were seen. You do have diverticulosis. Please read the information provided.  Next routine colonoscopy in 10 years (2023).  Thank you for choosing me and [facility].  Iva Boop, MD, FACG  YOU HAD AN ENDOSCOPIC PROCEDURE TODAY AT THE [facility]: Refer to the procedure report that was given to you for any specific questions about what was found during the examination.  If the procedure report does not answer your questions, please call your gastroenterologist to clarify.  If you requested that your care partner not be given the details of your procedure findings, then the procedure report has been included in a sealed envelope for you to review at your convenience later.  YOU SHOULD EXPECT: Some feelings of bloating in the abdomen. Passage of more gas than usual.  Walking can help get rid of the air that was put into your GI tract during the procedure and reduce the bloating. If you had a lower endoscopy (such as a colonoscopy or flexible sigmoidoscopy) you may notice spotting of blood in your stool or on the toilet paper. If you underwent a bowel prep for your procedure, then you may not have a normal bowel movement for a few days.  DIET: Your first meal following the procedure should be a light meal and then it is ok to progress to your normal diet.  A half-sandwich or bowl of soup is an example of a good first meal.  Heavy or fried foods are harder to digest and may make you feel nauseous or bloated.  Likewise meals heavy in dairy and vegetables can cause extra gas to form and this can also increase the bloating.  Drink plenty of fluids but you should avoid alcoholic beverages for 24 hours.  ACTIVITY: Your care partner should take you home directly after the procedure.  You should plan to take it easy, moving slowly for the rest of the day.  You can resume normal activity the day after the procedure however you should NOT DRIVE or use heavy  machinery for 24 hours (because of the sedation medicines used during the test).    SYMPTOMS TO REPORT IMMEDIATELY: A gastroenterologist can be reached at any hour.  During normal business hours, 8:30 AM to 5:00 PM Monday through Friday, call [phone].  After hours and on weekends, please call the GI answering service at [phone] who will take a message and have the physician on call contact you.   Following lower endoscopy (colonoscopy or flexible sigmoidoscopy):  Excessive amounts of blood in the stool  Significant tenderness or worsening of abdominal pains  Swelling of the abdomen that is new, acute  Fever of 100F or higher  FOLLOW UP: If any biopsies were taken you will be contacted by phone or by letter within the next 1-3 weeks.  Call your gastroenterologist if you have not heard about the biopsies in 3 weeks.  Our staff will call the home number listed on your records the next business day following your procedure to check on you and address any questions or concerns that you may have at that time regarding the information given to you following your procedure. This is a courtesy call and so if there is no answer at the home number and we have not heard from you through the emergency physician on call, we will assume that you have returned to your regular daily activities without incident.  SIGNATURES/CONFIDENTIALITY: You and/or your care partner have signed paperwork which  will be entered into your electronic medical record.  These signatures attest to the fact that that the information above on your After Visit Summary has been reviewed and is understood.  Full responsibility of the confidentiality of this discharge information lies with you and/or your care-partner.     Diverticulosis Diverticulosis is a common condition that develops when small pouches (diverticula) form in the wall of the colon. The risk of diverticulosis increases with age. It happens more often in  people who eat a low-fiber diet. Most individuals with diverticulosis have no symptoms. Those individuals with symptoms usually experience abdominal pain, constipation, or loose stools (diarrhea). HOME CARE INSTRUCTIONS   Increase the amount of fiber in your diet as directed by your caregiver or dietician. This may reduce symptoms of diverticulosis.   Your caregiver may recommend taking a dietary fiber supplement.   Drink at least 6 to 8 glasses of water each day to prevent constipation.   Try not to strain when you have a bowel movement.   Your caregiver may recommend avoiding nuts and seeds to prevent complications, although this is still an uncertain benefit.   Only take over-the-counter or prescription medicines for pain, discomfort, or fever as directed by your caregiver.  FOODS WITH HIGH FIBER CONTENT INCLUDE:  Fruits. Apple, peach, pear, tangerine, raisins, prunes.   Vegetables. Brussels sprouts, asparagus, broccoli, cabbage, carrot, cauliflower, romaine lettuce, spinach, summer squash, tomato, winter squash, zucchini.   Starchy Vegetables. Baked beans, kidney beans, lima beans, split peas, lentils, potatoes (with skin).   Grains. Whole wheat bread, brown rice, bran flake cereal, plain oatmeal, white rice, shredded wheat, bran muffins.  SEEK IMMEDIATE MEDICAL CARE IF:   You develop increasing pain or severe bloating.   You have an oral temperature above 102 F (38.9 C), not controlled by medicine.   You develop vomiting or bowel movements that are bloody or black.

## 2012-05-03 ENCOUNTER — Telehealth: Payer: Self-pay

## 2012-05-03 NOTE — Telephone Encounter (Signed)
Left message on answering. 

## 2012-08-24 IMAGING — CR DG CHEST 2V
2 series · 2 of 2 positions shown · non-contrast
Comparison: 08/13/2005

CLINICAL DATA: Preoperative respiratory exam. Osteoarthritis of the
knee.

CHEST - 2 VIEW

[view not recorded (1 of 2)]
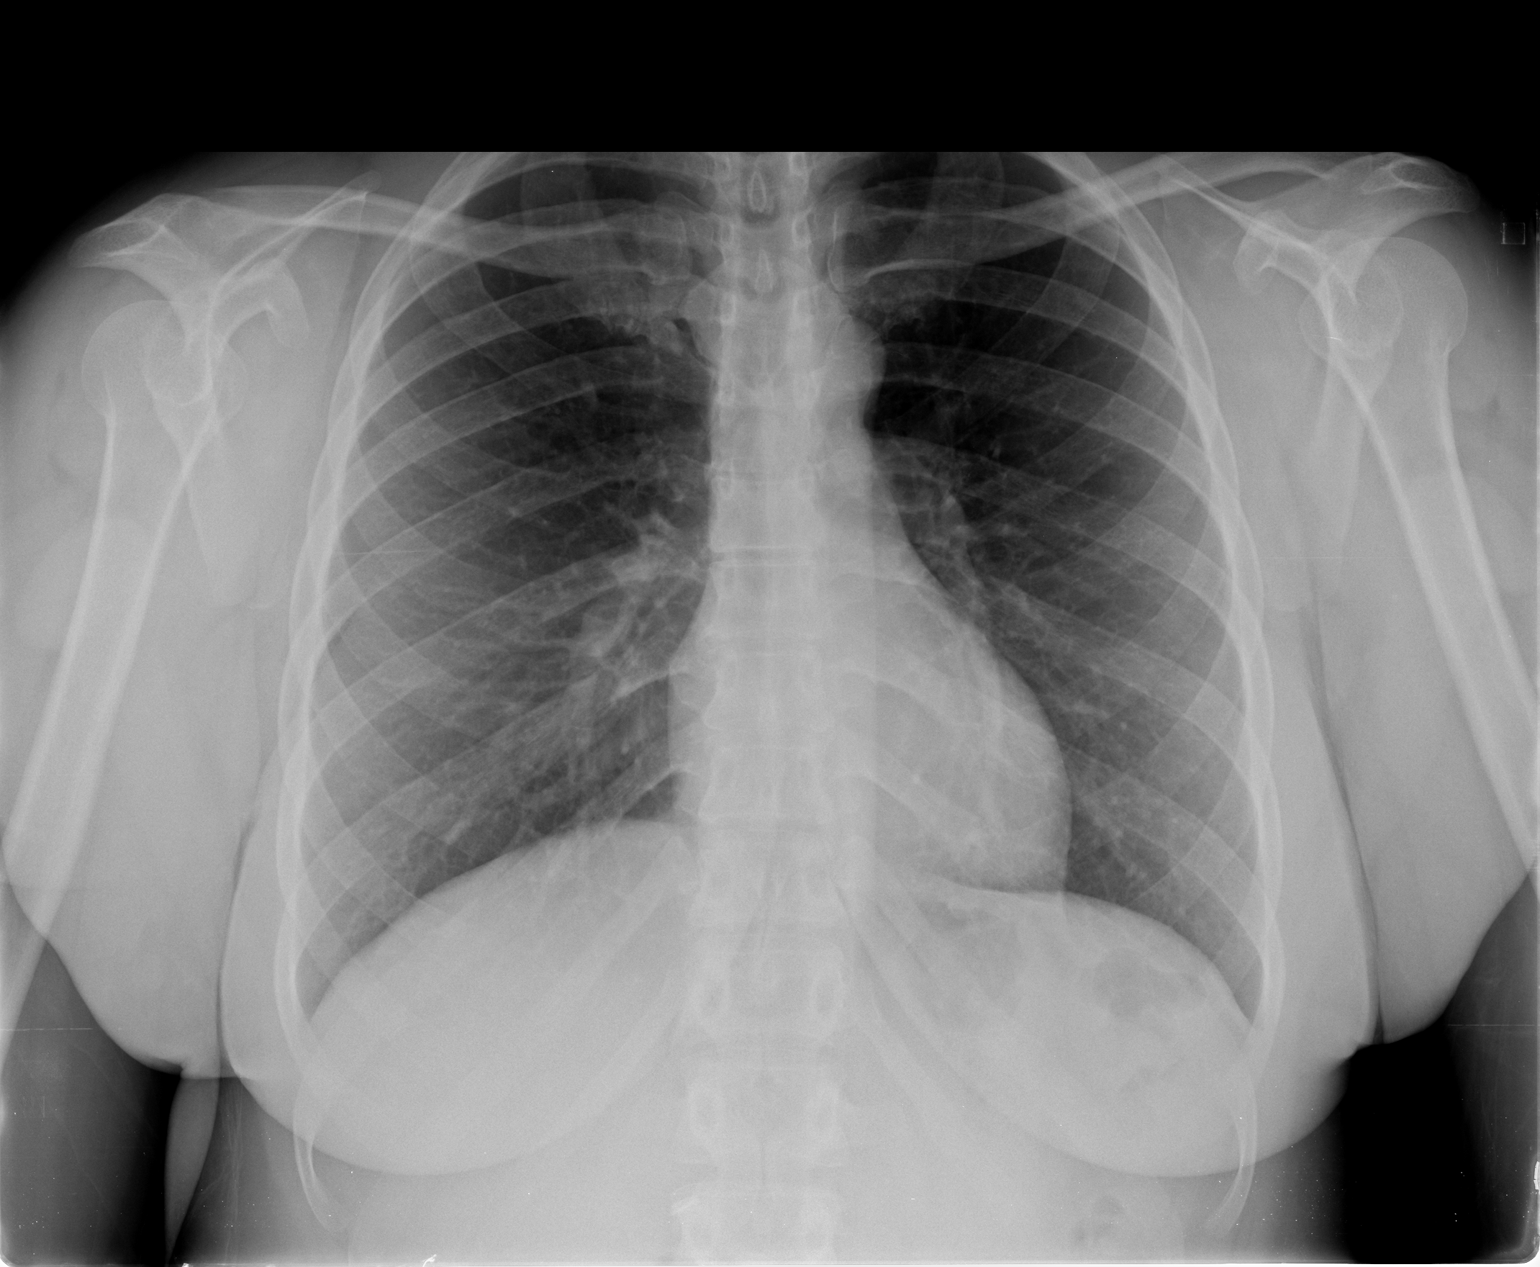

[view not recorded (2 of 2)]
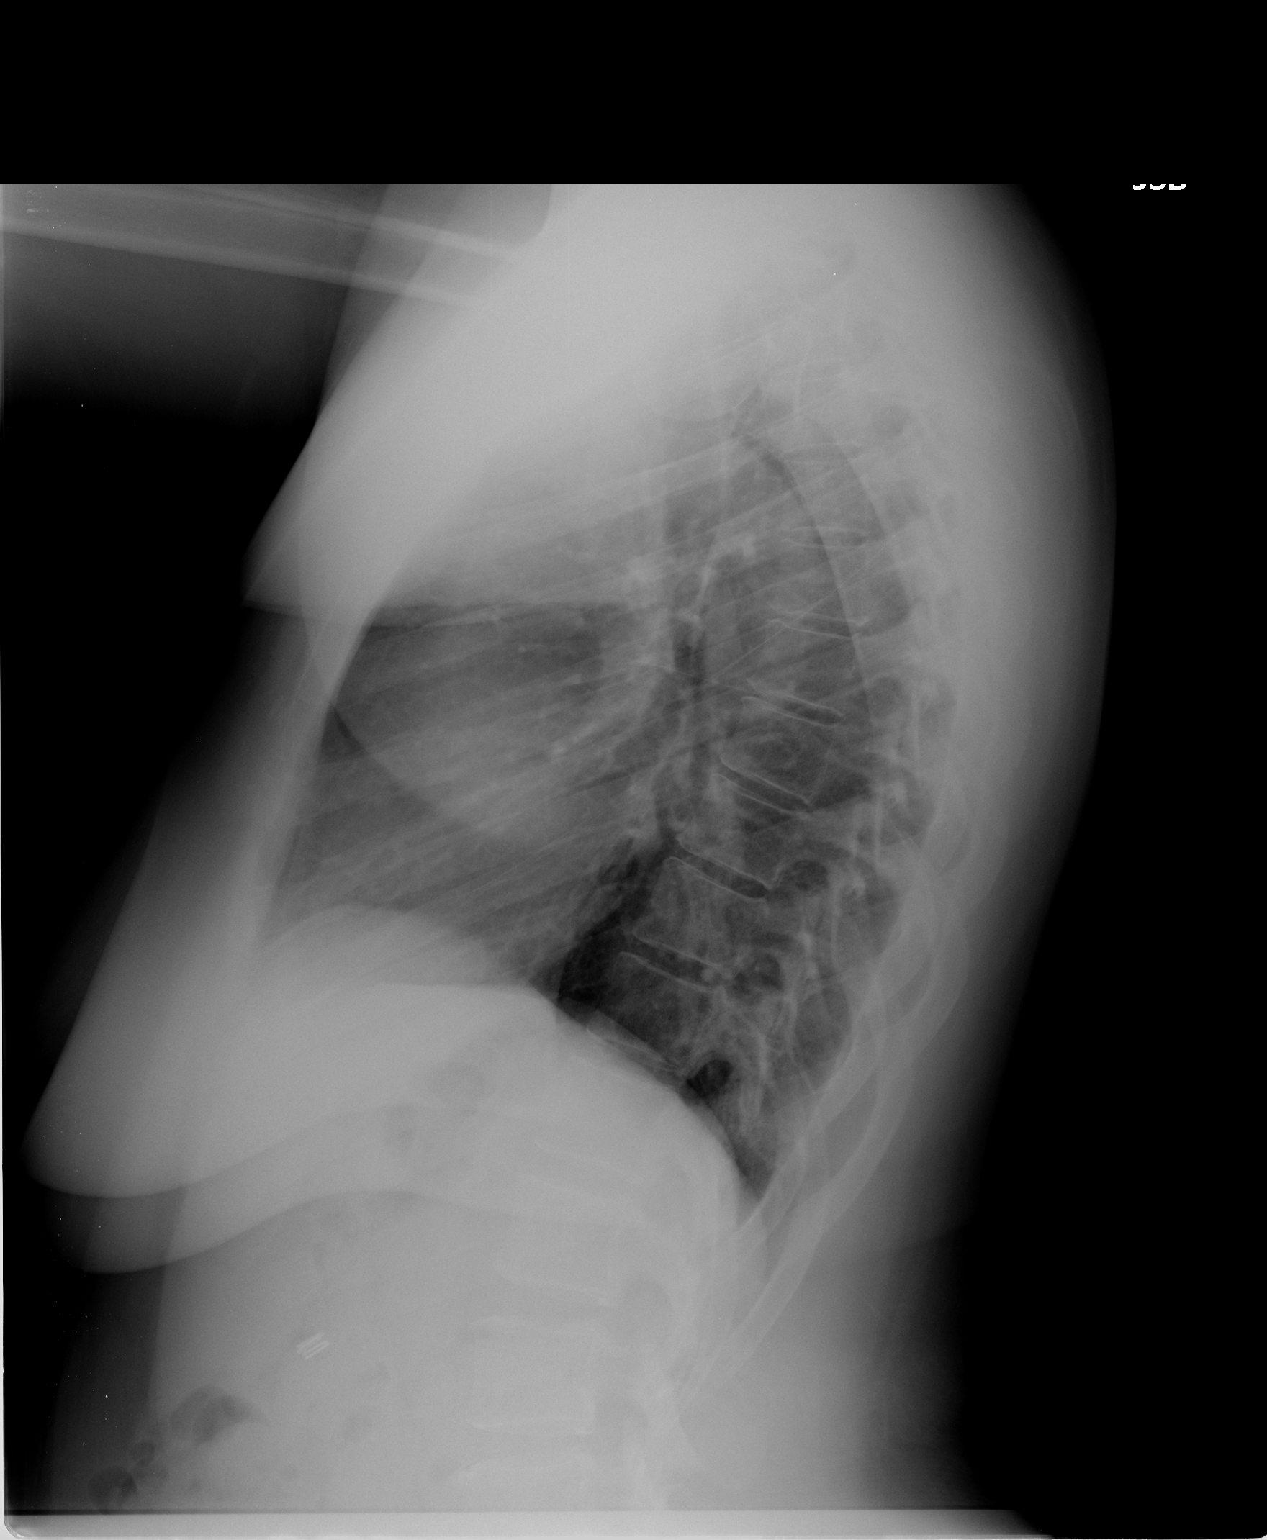

[2 of 2 positions shown; findings below may reference images not displayed]

FINDINGS: The heart and lungs appear normal.  No osseous
abnormality.
IMPRESSION: Normal exam.

## 2012-12-24 ENCOUNTER — Other Ambulatory Visit: Payer: Self-pay | Admitting: Orthopaedic Surgery

## 2012-12-24 DIAGNOSIS — M542 Cervicalgia: Secondary | ICD-10-CM

## 2012-12-28 ENCOUNTER — Ambulatory Visit
Admission: RE | Admit: 2012-12-28 | Discharge: 2012-12-28 | Disposition: A | Discharge status: Home / Self Care | Payer: Medicare Other | Source: Ambulatory Visit | Attending: Orthopaedic Surgery | Admitting: Orthopaedic Surgery

## 2012-12-28 DIAGNOSIS — M542 Cervicalgia: Secondary | ICD-10-CM

## 2013-11-08 ENCOUNTER — Ambulatory Visit (INDEPENDENT_AMBULATORY_CARE_PROVIDER_SITE_OTHER): Payer: Medicare HMO | Admitting: Physician Assistant

## 2013-11-08 VITALS — BP 126/84 | HR 78 | Temp 98.1°F | Resp 16 | Ht 64.5 in | Wt 188.0 lb

## 2013-11-08 DIAGNOSIS — N898 Other specified noninflammatory disorders of vagina: Secondary | ICD-10-CM

## 2013-11-08 DIAGNOSIS — N949 Unspecified condition associated with female genital organs and menstrual cycle: Secondary | ICD-10-CM

## 2013-11-08 LAB — POCT UA - MICROSCOPIC ONLY
Bacteria, U Microscopic: NEGATIVE
CASTS, UR, LPF, POC: NEGATIVE
CRYSTALS, UR, HPF, POC: NEGATIVE
MUCUS UA: POSITIVE
YEAST UA: NEGATIVE

## 2013-11-08 LAB — POCT URINALYSIS DIPSTICK
Bilirubin, UA: NEGATIVE
Glucose, UA: NEGATIVE
Ketones, UA: NEGATIVE
NITRITE UA: NEGATIVE
PH UA: 6
PROTEIN UA: NEGATIVE
RBC UA: NEGATIVE
Spec Grav, UA: 1.015
Urobilinogen, UA: 0.2

## 2013-11-08 LAB — POCT WET PREP WITH KOH
CLUE CELLS WET PREP PER HPF POC: NEGATIVE
KOH Prep POC: NEGATIVE
RBC WET PREP PER HPF POC: NEGATIVE
Trichomonas, UA: NEGATIVE
Yeast Wet Prep HPF POC: NEGATIVE

## 2013-11-08 MED ORDER — FLUCONAZOLE 150 MG PO TABS: 150.0000 mg | ORAL_TABLET | Freq: Once | ORAL | Status: DC

## 2013-11-08 NOTE — Patient Instructions (Signed)
Take the diflucan pill once today.  Repeat in 1 week if needed.  If symptoms persist, we may need to take another look at what could be causing your symptoms  I will let you know when the rest of your labs are back and if we need to do anything else   Monilial Vaginitis Vaginitis in a soreness, swelling and redness (inflammation) of the vagina and vulva. Monilial vaginitis is not a sexually transmitted infection. CAUSES  Yeast vaginitis is caused by yeast (candida) that is normally found in your vagina. With a yeast infection, the candida has overgrown in number to a point that upsets the chemical balance. SYMPTOMS   White, thick vaginal discharge.  Swelling, itching, redness and irritation of the vagina and possibly the lips of the vagina (vulva).  Burning or painful urination.  Painful intercourse. DIAGNOSIS  Things that may contribute to monilial vaginitis are:  Postmenopausal and virginal states.  Pregnancy.  Infections.  Being tired, sick or stressed, especially if you had monilial vaginitis in the past.  Diabetes. Good control will help lower the chance.  Birth control pills.  Tight fitting garments.  Using bubble bath, feminine sprays, douches or deodorant tampons.  Taking certain medications that kill germs (antibiotics).  Sporadic recurrence can occur if you become ill. TREATMENT  Your caregiver will give you medication.  There are several kinds of anti monilial vaginal creams and suppositories specific for monilial vaginitis. For recurrent yeast infections, use a suppository or cream in the vagina 2 times a week, or as directed.  Anti-monilial or steroid cream for the itching or irritation of the vulva may also be used. Get your caregiver's permission.  Painting the vagina with methylene blue solution may help if the monilial cream does not work.  Eating yogurt may help prevent monilial vaginitis. HOME CARE INSTRUCTIONS   Finish all medication as  prescribed.  Do not have sex until treatment is completed or after your caregiver tells you it is okay.  Take warm sitz baths.  Do not douche.  Do not use tampons, especially scented ones.  Wear cotton underwear.  Avoid tight pants and panty hose.  Tell your sexual partner that you have a yeast infection. They should go to their caregiver if they have symptoms such as mild rash or itching.  Your sexual partner should be treated as well if your infection is difficult to eliminate.  Practice safer sex. Use condoms.  Some vaginal medications cause latex condoms to fail. Vaginal medications that harm condoms are:  Cleocin cream.  Butoconazole (Femstat).  Terconazole (Terazol) vaginal suppository.  Miconazole (Monistat) (may be purchased over the counter). SEEK MEDICAL CARE IF:   You have a temperature by mouth above 102 F (38.9 C).  The infection is getting worse after 2 days of treatment.  The infection is not getting better after 3 days of treatment.  You develop blisters in or around your vagina.  You develop vaginal bleeding, and it is not your menstrual period.  You have pain when you urinate.  You develop intestinal problems.  You have pain with sexual intercourse. Document Released: 07/09/2005 Document Revised: 12/22/2011 Document Reviewed: 03/23/2009 [facility] Patient Information 2014 [place], [place].

## 2013-11-08 NOTE — Progress Notes (Signed)
Subjective:    Patient ID: Jennifer [name], female    DOB: 1961/06/02, 53 y.o.   MRN: [id number]  HPI   Jennifer [name] is a very pleasant 53 yr old female here with concern for a possible yeast infection.  Has noted vaginal discomfort x 2-3 days and increased vaginal discharge x 1 wk.  Has had yeast infections in the past, but it has been awhile.  On chart review, also with BV in the past.  Has not tried any OTC remedies.  Denies abnormal bleeding, spotting.  S/p total hysterectomy in 1995 due to fibroids.  She is currently sexually active.  No specific concern for STI but would like gonorrhea and chlamydia testing.  Unsure when she was last tested.  Denies fever, chills, abd pain, NV.  Also complains of night time lower extremity cramping.  Tends to happen primarily in feet, lower legs, occ up to thighs.  Prevents from sleep  Review of Systems  Constitutional: Negative for fever and chills.  Respiratory: Negative.   Cardiovascular: Negative.   Gastrointestinal: Negative for nausea, vomiting and abdominal pain.  Genitourinary: Positive for vaginal discharge. Negative for dysuria, urgency, frequency, vaginal bleeding and pelvic pain.  Musculoskeletal: Negative.   Skin: Negative.        Objective:   Physical Exam  Vitals reviewed. Constitutional: She is oriented to person, place, and time. She appears well-developed and well-nourished. No distress.  HENT:  Head: Normocephalic.  Eyes: Conjunctivae are normal. No scleral icterus.  Cardiovascular: Normal rate, regular rhythm and normal heart sounds.   Pulmonary/Chest: Effort normal and breath sounds normal. She has no wheezes. She has no rales.  Abdominal: Soft. There is no tenderness.  Genitourinary: There is no rash, tenderness or lesion on the right labia. There is no rash, tenderness or lesion on the left labia. Right adnexum displays no mass, no tenderness and no fullness. Left adnexum displays no mass, no tenderness and no fullness.  Vaginal discharge (moderately thick, white) found.  Cervix surgically absent  Neurological: She is alert and oriented to person, place, and time.  Skin: Skin is warm and dry.  Psychiatric: She has a normal mood and affect. Her behavior is normal.   Results for orders placed in visit on 11/08/13  GC/CHLAMYDIA PROBE AMP      Result Value Range   CT Probe RNA NEGATIVE     GC Probe RNA NEGATIVE    POCT URINALYSIS DIPSTICK      Result Value Range   Color, UA yellow     Clarity, UA cloudy     Glucose, UA negative     Bilirubin, UA negative     Ketones, UA negative     Spec Grav, UA 1.015     Blood, UA negative     pH, UA 6.0     Protein, UA negative     Urobilinogen, UA 0.2     Nitrite, UA negative     Leukocytes, UA moderate (2+)    POCT UA - MICROSCOPIC ONLY      Result Value Range   WBC, Ur, HPF, POC 12-22     RBC, urine, microscopic 1-3     Bacteria, U Microscopic negative     Mucus, UA positive     Epithelial cells, urine per micros 3-8     Crystals, Ur, HPF, POC negative     Casts, Ur, LPF, POC negative     Yeast, UA negative    POCT WET PREP WITH  KOH      Result Value Range   Trichomonas, UA Negative     Clue Cells Wet Prep HPF POC negative     Epithelial Wet Prep HPF POC 1-5     Yeast Wet Prep HPF POC negative     Bacteria Wet Prep HPF POC trace     RBC Wet Prep HPF POC negative     WBC Wet Prep HPF POC 0-1     KOH Prep POC Negative         Assessment & Plan:  Vaginal discharge - Plan: POCT urinalysis dipstick, POCT UA - Microscopic Only, POCT Wet Prep with KOH, GC/Chlamydia Probe Amp, fluconazole (DIFLUCAN) 150 MG tablet  Vaginal discomfort - Plan: POCT urinalysis dipstick, POCT UA - Microscopic Only, POCT Wet Prep with KOH, GC/Chlamydia Probe Amp, fluconazole (DIFLUCAN) 150 MG tablet   Jennifer [name] is a very pleasant 53 yr old female here with concern for yeast infection.  Moderate amount of thick, homogenous, white discharge on exam.  Wet prep neg for clues or  yeast.  Given symptoms, suspect this may in fact be yeast, so will treat as such with diflucan.  GC/chlamydia neg.  If no improvement with diflucan, pt to RTC for further eval.  Pt with concern for nocturnal leg cramping.  Encouraged pt to increase water intake and add multivitamin with B complex.  If no improvement, pt to RTC for further eval.  Discussed desire to manage this with nonpharm interventions first, which pt understands and agrees with   E. Natividad Brood MHS, PA-C [facility] 1/28/201510:41 AM

## 2013-11-09 LAB — GC/CHLAMYDIA PROBE AMP
CT PROBE, AMP APTIMA: NEGATIVE
GC PROBE AMP APTIMA: NEGATIVE

## 2014-05-19 ENCOUNTER — Other Ambulatory Visit: Payer: Self-pay

## 2014-05-19 DIAGNOSIS — Z1231 Encounter for screening mammogram for malignant neoplasm of breast: Secondary | ICD-10-CM

## 2014-06-01 ENCOUNTER — Ambulatory Visit
Admission: RE | Admit: 2014-06-01 | Discharge: 2014-06-01 | Disposition: A | Discharge status: Home / Self Care | Payer: Medicare HMO | Source: Ambulatory Visit

## 2014-06-01 DIAGNOSIS — Z1231 Encounter for screening mammogram for malignant neoplasm of breast: Secondary | ICD-10-CM

## 2014-07-25 ENCOUNTER — Ambulatory Visit (INDEPENDENT_AMBULATORY_CARE_PROVIDER_SITE_OTHER): Payer: Medicare HMO

## 2014-07-25 ENCOUNTER — Ambulatory Visit (INDEPENDENT_AMBULATORY_CARE_PROVIDER_SITE_OTHER): Payer: Medicare HMO | Admitting: Family Medicine

## 2014-07-25 VITALS — BP 118/78 | HR 71 | Temp 98.1°F | Resp 16 | Ht 65.0 in | Wt 202.8 lb

## 2014-07-25 DIAGNOSIS — R059 Cough, unspecified: Secondary | ICD-10-CM

## 2014-07-25 DIAGNOSIS — J209 Acute bronchitis, unspecified: Secondary | ICD-10-CM

## 2014-07-25 DIAGNOSIS — R05 Cough: Secondary | ICD-10-CM

## 2014-07-25 DIAGNOSIS — H9201 Otalgia, right ear: Secondary | ICD-10-CM

## 2014-07-25 MED ORDER — HYDROCODONE-HOMATROPINE 5-1.5 MG/5ML PO SYRP: 5.0000 mL | ORAL_SOLUTION | Freq: Three times a day (TID) | ORAL | Status: DC | PRN

## 2014-07-25 MED ORDER — ALBUTEROL SULFATE (2.5 MG/3ML) 0.083% IN NEBU
2.5000 mg | INHALATION_SOLUTION | Freq: Once | RESPIRATORY_TRACT | Status: AC
  Administered 2014-07-25: 2.5 mg via RESPIRATORY_TRACT

## 2014-07-25 MED ORDER — ALBUTEROL SULFATE (2.5 MG/3ML) 0.083% IN NEBU: 2.5000 mg | INHALATION_SOLUTION | Freq: Four times a day (QID) | RESPIRATORY_TRACT | Status: DC | PRN

## 2014-07-25 MED ORDER — LEVOFLOXACIN 500 MG PO TABS: 500.0000 mg | ORAL_TABLET | Freq: Every day | ORAL | Status: DC

## 2014-07-25 MED ORDER — IPRATROPIUM BROMIDE 0.02 % IN SOLN
0.5000 mg | Freq: Once | RESPIRATORY_TRACT | Status: AC
  Administered 2014-07-25: 0.5 mg via RESPIRATORY_TRACT

## 2014-07-25 NOTE — Patient Instructions (Signed)
Recheck in 2 days

## 2014-07-25 NOTE — Progress Notes (Signed)
This chart was scribed for Robyn Haber, MD by Terressa Koyanagi, ED Scribe at [facility]. This patient was seen in room Room 11 and the patient's care was started at 10:22 AM.  Patient ID: Jennifer [name] MRN: [id number], DOB: [birth year], 53 y.o. Date of Encounter: 07/25/2014, 10:22 AM  Primary Physician: Ernestene Kiel, MD Chief Complaint  Patient presents with  . Cough    x5 days; productive cough; green phelgm; no fever/chills  . Chest Pain    x5 days  . Sore Throat    x5 days   HPI: 53 y.o. year old female with history below presents with intermittent chest wall pain and associated unproductive cough onset 5 days ago. Pt also complains of mild right ear pain; pt denies any decreased hearing. Pt denies sore throat or fever.   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 Pt reports she had to stop working due to medical reasons. She fell and subsequently had three back surgeries.    Past Medical History  Diagnosis Date  . Hypertension   . Blood transfusion 05/2011     Home Meds: Prior to Admission medications   Medication Sig Start Date End Date Taking? Authorizing Provider  hydrochlorothiazide (HYDRODIURIL) 25 MG tablet Take 25 mg by mouth daily.   Yes Historical Provider, MD  polyethylene glycol powder (GLYCOLAX/MIRALAX) powder Take 17 g by mouth as needed. For constipation 01/25/12  Yes Historical Provider, MD  traMADol (ULTRAM) 50 MG tablet 1-2 every 6 hrs as needed 01/25/12  Yes Leandrew Koyanagi, MD    Allergies:  Allergies  Allergen Reactions  . Darvocet [Propoxyphene N-Acetaminophen] Swelling  . Lodine [Etodolac] Swelling  . Methocarbamol Swelling    History   Social History  . Marital Status: Legally Separated    Spouse Name: N/A    Number of Children: N/A  . Years of Education: N/A   Occupational History  . Not on file.   Social History Main Topics  . Smoking status: Never  Smoker   . Smokeless tobacco: Never Used  . Alcohol Use: No  . Drug Use: No  . Sexual Activity: Not on file   Other Topics Concern  . Not on file   Social History Narrative  . No narrative on file     Review of Systems: Constitutional: negative for chills, fever, night sweats, weight changes, or fatigue  HEENT: negative for vision changes, hearing loss, congestion, rhinorrhea, ST, epistaxis, or sinus pressure. Positive for right ear pain.  Cardiovascular: negative for chest pain or palpitations. Positive for upper substernal chest pain.  Respiratory: negative for hemoptysis,shortness of breath. Positive for cough and wheezing.  Abdominal: negative for abdominal pain, nausea, vomiting, diarrhea, or constipation Dermatological: negative for rash Neurologic: negative for headache, dizziness, or syncope All other systems reviewed and are otherwise negative with the exception to those above and in the HPI.   Physical Exam: Blood pressure 118/78, pulse 71, temperature 98.1 F (36.7 C), temperature source Oral, resp. rate 16, height 5\' 5"  (1.651 m), weight 202 lb 12.8 oz (91.989 kg), SpO2 99.00%., Body mass index is 33.75 kg/(m^2). General: Well developed, well nourished, in no acute distress. Head: Normocephalic, atraumatic, eyes without discharge, sclera non-icteric, nares are without discharge. Bilateral auditory canals clear, TM's show right ear erythema. Oral cavity moist, posterior pharynx without exudate, erythema, peritonsillar abscess, or post nasal drip.  Neck: Supple. No thyromegaly. Full ROM. No lymphadenopathy. Lungs: Clear  bilaterally to auscultation with bilateral wheezes, rales, and rhonchi. Breathing is unlabored. Heart: RRR with S1 S2. No murmurs, rubs, or gallops appreciated. Abdomen: Soft, non-tender, non-distended with normoactive bowel sounds. No hepatomegaly. No rebound/guarding. No obvious abdominal masses. Msk:  Strength and tone normal for age. Extremities/Skin:  Warm and dry. No clubbing or cyanosis. No edema. No rashes or suspicious lesions. Neuro: Alert and oriented X 3. Moves all extremities spontaneously. Gait is normal. CNII-XII grossly in tact. Psych:  Responds to questions appropriately with a normal affect.   [facility] reading (PRIMARY) by  Dr. Joseph Art:  CXR heavy markings without definite infiltrate.     ASSESSMENT AND PLAN:  DIAGNOSTIC STUDIES: Oxygen Saturation is 99% on RA, nl by my interpretation.    COORDINATION OF CARE: 10:24 AM-Discussed treatment plan which includes chest x-ray with pt at bedside and pt agreed to plan.   53 y.o. year old female with Cough - Plan: DG Chest 2 View  Otalgia, right - Plan: levofloxacin (LEVAQUIN) 500 MG tablet  Acute bronchitis, unspecified organism - Plan: levofloxacin (LEVAQUIN) 500 MG tablet, HYDROcodone-homatropine (HYCODAN) 5-1.5 MG/5ML syrup   Given nebulizer Rx in office  Signed, Robyn Haber, MD 07/25/2014 10:22 AM  I personally performed the services described in this documentation, which was scribed in my presence. The recorded information has been reviewed and is accurate.

## 2014-07-27 ENCOUNTER — Ambulatory Visit: Payer: Medicare HMO

## 2014-09-30 ENCOUNTER — Ambulatory Visit (INDEPENDENT_AMBULATORY_CARE_PROVIDER_SITE_OTHER): Payer: Medicare HMO | Admitting: Emergency Medicine

## 2014-09-30 VITALS — BP 110/78 | HR 72 | Temp 97.7°F | Resp 16 | Ht 65.5 in | Wt 196.0 lb

## 2014-09-30 DIAGNOSIS — J069 Acute upper respiratory infection, unspecified: Secondary | ICD-10-CM

## 2014-09-30 MED ORDER — IPRATROPIUM BROMIDE 0.03 % NA SOLN: 2.0000 | Freq: Two times a day (BID) | NASAL | Status: DC

## 2014-09-30 NOTE — Progress Notes (Signed)
[facility] [address] [place] [address] [phone]  Date:  09/30/2014   Name:  Jennifer [name]   DOB:  [birth year]   MRN:  [id number]  PCP:  Ernestene Kiel, MD    Chief Complaint: Cough; Sore Throat; Nasal Congestion; and chest congestion   History of Present Illness:  Jennifer [name] is a 53 y.o. very pleasant female patient who presents with the following:  Patient with a history of 6 days duration nasal congestion and mucoid drainage Has a sore throat Cough productive of mucoid sputum.  No wheezing or shortness of breath No fever or chills.  No nausea or vomiting. No nausea or vomiting. No improvement with over the counter medications or other home remedies.  Denies other complaint or health concern today.   There are no active problems to display for this patient.   Past Medical History  Diagnosis Date  . Hypertension   . Blood transfusion 05/2011    Past Surgical History  Procedure Laterality Date  . Total knee arthroplasty  04/2011    Right  . Back surgery  2003/2007    discectomy lower back '03/ '07 rods and artificial disk  . Cervical fusion  2006  . Cholecystectomy  2006  . Abdominal hysterectomy  1996    partial    History  Substance Use Topics  . Smoking status: Never Smoker   . Smokeless tobacco: Never Used  . Alcohol Use: No    Family History  Problem Relation Age of Onset  . Cancer Father   . Diabetes Brother   . Diabetes Brother     Allergies  Allergen Reactions  . Darvocet [Propoxyphene N-Acetaminophen] Swelling  . Lodine [Etodolac] Swelling  . Methocarbamol Swelling    Medication list has been reviewed and updated.  Current Outpatient Prescriptions on File Prior to Visit  Medication Sig Dispense Refill  . albuterol (PROVENTIL) (2.5 MG/3ML) 0.083% nebulizer solution Take 3 mLs (2.5 mg total) by nebulization every 6 (six) hours as needed for wheezing or shortness of breath. 150 mL 1  .  hydrochlorothiazide (HYDRODIURIL) 25 MG tablet Take 25 mg by mouth daily.    Marland Kitchen HYDROcodone-homatropine (HYCODAN) 5-1.5 MG/5ML syrup Take 5 mLs by mouth every 8 (eight) hours as needed for cough. 120 mL 0  . levofloxacin (LEVAQUIN) 500 MG tablet Take 1 tablet (500 mg total) by mouth daily. 7 tablet 0  . polyethylene glycol powder (GLYCOLAX/MIRALAX) powder Take 17 g by mouth as needed. For constipation    . traMADol (ULTRAM) 50 MG tablet 1-2 every 6 hrs as needed 36 tablet 0   Current Facility-Administered Medications on File Prior to Visit  Medication Dose Route Frequency Provider Last Rate Last Dose  . 0.9 %  sodium chloride infusion  500 mL Intravenous Continuous Gatha Mayer, MD        Review of Systems:  As per HPI, otherwise negative.    Physical Examination: Filed Vitals:   09/30/14 0817  BP: 110/78  Pulse: 72  Temp: 97.7 F (36.5 C)  Resp: 16   Filed Vitals:   09/30/14 0817  Height: 5' 5.5" (1.664 m)  Weight: 196 lb (88.905 kg)   Body mass index is 32.11 kg/(m^2). Ideal Body Weight: Weight in (lb) to have BMI = 25: 152.2  GEN: WDWN, NAD, Non-toxic, A & O x 3 HEENT: Atraumatic, Normocephalic. Neck supple. No masses, No LAD. Ears and Nose: No external deformity.  Clear nasal drainage CV: RRR, No  M/G/R. No JVD. No thrill. No extra heart sounds. PULM: CTA B, no wheezes, crackles, rhonchi. No retractions. No resp. distress. No accessory muscle use. ABD: S, NT, ND, +BS. No rebound. No HSM. EXTR: No c/c/e NEURO Normal gait.  PSYCH: Normally interactive. Conversant. Not depressed or anxious appearing.  Calm demeanor.    Assessment and Plan: URI  Signed,  Ellison Carwin, MD

## 2014-09-30 NOTE — Patient Instructions (Addendum)
Upper Respiratory Infection, Adult An upper respiratory infection (URI) is also sometimes known as the common cold. The upper respiratory tract includes the nose, sinuses, throat, trachea, and bronchi. Bronchi are the airways leading to the lungs. Most people improve within 1 week, but symptoms can last up to 2 weeks. A residual cough may last even longer.  CAUSES Many different viruses can infect the tissues lining the upper respiratory tract. The tissues become irritated and inflamed and often become very moist. Mucus production is also common. A cold is contagious. You can easily spread the virus to others by oral contact. This includes kissing, sharing a glass, coughing, or sneezing. Touching your mouth or nose and then touching a surface, which is then touched by another person, can also spread the virus. SYMPTOMS  Symptoms typically develop 1 to 3 days after you come in contact with a cold virus. Symptoms vary from person to person. They may include:  Runny nose.  Sneezing.  Nasal congestion.  Sinus irritation.  Sore throat.  Loss of voice (laryngitis).  Cough.  Fatigue.  Muscle aches.  Loss of appetite.  Headache.  Low-grade fever. DIAGNOSIS  You might diagnose your own cold based on familiar symptoms, since most people get a cold 2 to 3 times a year. Your caregiver can confirm this based on your exam. Most importantly, your caregiver can check that your symptoms are not due to another disease such as strep throat, sinusitis, pneumonia, asthma, or epiglottitis. Blood tests, throat tests, and X-rays are not necessary to diagnose a common cold, but they may sometimes be helpful in excluding other more serious diseases. Your caregiver will decide if any further tests are required. RISKS AND COMPLICATIONS  You may be at risk for a more severe case of the common cold if you smoke cigarettes, have chronic heart disease (such as heart failure) or lung disease (such as asthma), or if  you have a weakened immune system. The very young and very old are also at risk for more serious infections. Bacterial sinusitis, middle ear infections, and bacterial pneumonia can complicate the common cold. The common cold can worsen asthma and chronic obstructive pulmonary disease (COPD). Sometimes, these complications can require emergency medical care and may be life-threatening. PREVENTION  The best way to protect against getting a cold is to practice good hygiene. Avoid oral or hand contact with people with cold symptoms. Wash your hands often if contact occurs. There is no clear evidence that vitamin C, vitamin E, echinacea, or exercise reduces the chance of developing a cold. However, it is always recommended to get plenty of rest and practice good nutrition. TREATMENT  Treatment is directed at relieving symptoms. There is no cure. Antibiotics are not effective, because the infection is caused by a virus, not by bacteria. Treatment may include:  Increased fluid intake. Sports drinks offer valuable electrolytes, sugars, and fluids.  Breathing heated mist or steam (vaporizer or shower).  Eating chicken soup or other clear broths, and maintaining good nutrition.  Getting plenty of rest.  Using gargles or lozenges for comfort.  Controlling fevers with ibuprofen or acetaminophen as directed by your caregiver.  Increasing usage of your inhaler if you have asthma. Zinc gel and zinc lozenges, taken in the first 24 hours of the common cold, can shorten the duration and lessen the severity of symptoms. Pain medicines may help with fever, muscle aches, and throat pain. A variety of non-prescription medicines are available to treat congestion and runny nose. Your caregiver   can make recommendations and may suggest nasal or lung inhalers for other symptoms.  HOME CARE INSTRUCTIONS   Only take over-the-counter or prescription medicines for pain, discomfort, or fever as directed by your  caregiver.  Use a warm mist humidifier or inhale steam from a shower to increase air moisture. This may keep secretions moist and make it easier to breathe.  Drink enough water and fluids to keep your urine clear or pale yellow.  Rest as needed.  Return to work when your temperature has returned to normal or as your caregiver advises. You may need to stay home longer to avoid infecting others. You can also use a face mask and careful hand washing to prevent spread of the virus. SEEK MEDICAL CARE IF:   After the first few days, you feel you are getting worse rather than better.  You need your caregiver's advice about medicines to control symptoms.  You develop chills, worsening shortness of breath, or brown or red sputum. These may be signs of pneumonia.  You develop yellow or brown nasal discharge or pain in the face, especially when you bend forward. These may be signs of sinusitis.  You develop a fever, swollen neck glands, pain with swallowing, or white areas in the back of your throat. These may be signs of strep throat. SEEK IMMEDIATE MEDICAL CARE IF:   You have a fever.  You develop severe or persistent headache, ear pain, sinus pain, or chest pain.  You develop wheezing, a prolonged cough, cough up blood, or have a change in your usual mucus (if you have chronic lung disease).  You develop sore muscles or a stiff neck. Document Released: 03/25/2001 Document Revised: 12/22/2011 Document Reviewed: 01/04/2014 [facility] Patient Information 2015 [place], [place]. This information is not intended to replace advice given to you by your health care provider. Make sure you discuss any questions you have with your health care provider.   1. Take Mucinex with plenty of water.

## 2014-09-30 NOTE — Progress Notes (Signed)
   Subjective:    Patient ID: Jennifer [name], female    DOB: 03-10-1961, 53 y.o.   MRN: [id number]  HPI Patient presents with 6 days of nasal congestion and rhinorrhea. Has had mild productive cough, sinus pressure, and a scratchy throat. Denies fever, SOB/CP, fatigue, or headache. Says today she is starting to feel marginally better. Has tried Sudafed and hot tea with mild relief. 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 Denies h/o asthma or allergies.    Review of Systems  Constitutional: Negative for fever, chills and fatigue.  HENT: Positive for congestion, rhinorrhea, sinus pressure and sore throat. Negative for ear discharge, ear pain, postnasal drip and sneezing.   Eyes: Negative.   Respiratory: Positive for cough. Negative for shortness of breath and wheezing.   Cardiovascular: Negative for chest pain.  Gastrointestinal: Positive for nausea. Negative for vomiting and abdominal pain.  Allergic/Immunologic: Negative for environmental allergies and food allergies.  Neurological: Negative for dizziness, light-headedness and headaches.  Hematological: Negative for adenopathy.       Objective:   Physical Exam  Constitutional: She is oriented to person, place, and time. She appears well-developed and well-nourished. No distress.  Blood pressure 110/78, pulse 72, temperature 97.7 F (36.5 C), temperature source Oral, resp. rate 16, height 5' 5.5" (1.664 m), weight 196 lb (88.905 kg), SpO2 98 %.  HENT:  Head: Normocephalic and atraumatic.  Right Ear: External ear normal. A foreign body (copious amounts of cerumen) is present.  Left Ear: External ear normal. Left ear foreign body: copious amounts of cerumen.  Nose: Rhinorrhea (minimal with mild erythema) present. No mucosal edema or sinus tenderness. Right sinus exhibits no maxillary sinus tenderness and no frontal sinus tenderness. Left sinus exhibits no maxillary sinus tenderness and no frontal sinus tenderness.    Mouth/Throat: Uvula is midline and oropharynx is clear and moist. No oropharyngeal exudate, posterior oropharyngeal edema or posterior oropharyngeal erythema.  Eyes: Pupils are equal, round, and reactive to light. Right eye exhibits no discharge. Left eye exhibits no discharge.  Neck: Neck supple.  Cardiovascular: Normal rate, regular rhythm and normal heart sounds.  Exam reveals no gallop and no friction rub.   No murmur heard. Pulmonary/Chest: Effort normal and breath sounds normal. No respiratory distress. She has no wheezes. She has no rhonchi. She has no rales.  Abdominal: Soft. Bowel sounds are normal. There is no tenderness.  Lymphadenopathy:    She has no cervical adenopathy.  Neurological: She is alert and oriented to person, place, and time.  Skin: Skin is warm and dry. No rash noted. She is not diaphoretic. No erythema. No pallor.       Assessment & Plan:  1. Acute upper respiratory infection Take Mucinex with plenty of water and rest.  - ipratropium (ATROVENT) 0.03 % nasal spray; Place 2 sprays into both nostrils 2 (two) times daily.  Dispense: 30 mL; Refill: 0   Jennifer Runquist PA-C  [facility] 09/30/2014 8:53 AM

## 2015-03-16 ENCOUNTER — Ambulatory Visit
Admission: RE | Admit: 2015-03-16 | Discharge: 2015-03-16 | Disposition: A | Discharge status: Home / Self Care | Payer: Medicare HMO | Source: Ambulatory Visit | Attending: Orthopaedic Surgery | Admitting: Orthopaedic Surgery

## 2015-03-16 ENCOUNTER — Other Ambulatory Visit: Payer: Self-pay | Admitting: Orthopaedic Surgery

## 2015-03-16 DIAGNOSIS — M542 Cervicalgia: Secondary | ICD-10-CM

## 2015-07-09 ENCOUNTER — Other Ambulatory Visit: Payer: Self-pay

## 2015-07-09 DIAGNOSIS — Z1231 Encounter for screening mammogram for malignant neoplasm of breast: Secondary | ICD-10-CM

## 2015-07-12 ENCOUNTER — Ambulatory Visit
Admission: RE | Admit: 2015-07-12 | Discharge: 2015-07-12 | Disposition: A | Discharge status: Home / Self Care | Payer: Medicare HMO | Source: Ambulatory Visit

## 2015-07-12 DIAGNOSIS — Z1231 Encounter for screening mammogram for malignant neoplasm of breast: Secondary | ICD-10-CM

## 2015-08-09 DIAGNOSIS — M1712 Unilateral primary osteoarthritis, left knee: Secondary | ICD-10-CM | POA: Diagnosis not present

## 2015-08-09 DIAGNOSIS — Z96651 Presence of right artificial knee joint: Secondary | ICD-10-CM | POA: Diagnosis not present

## 2015-08-09 DIAGNOSIS — Z471 Aftercare following joint replacement surgery: Secondary | ICD-10-CM | POA: Diagnosis not present

## 2015-10-25 DIAGNOSIS — M1712 Unilateral primary osteoarthritis, left knee: Secondary | ICD-10-CM | POA: Diagnosis not present

## 2015-10-25 DIAGNOSIS — Z471 Aftercare following joint replacement surgery: Secondary | ICD-10-CM | POA: Diagnosis not present

## 2015-10-25 DIAGNOSIS — Z96651 Presence of right artificial knee joint: Secondary | ICD-10-CM | POA: Diagnosis not present

## 2015-10-31 DIAGNOSIS — Z6837 Body mass index (BMI) 37.0-37.9, adult: Secondary | ICD-10-CM | POA: Diagnosis not present

## 2015-10-31 DIAGNOSIS — M549 Dorsalgia, unspecified: Secondary | ICD-10-CM | POA: Diagnosis not present

## 2015-10-31 DIAGNOSIS — M47896 Other spondylosis, lumbar region: Secondary | ICD-10-CM | POA: Diagnosis not present

## 2015-10-31 DIAGNOSIS — M961 Postlaminectomy syndrome, not elsewhere classified: Secondary | ICD-10-CM | POA: Diagnosis not present

## 2016-01-10 DIAGNOSIS — I10 Essential (primary) hypertension: Secondary | ICD-10-CM | POA: Diagnosis not present

## 2016-01-10 DIAGNOSIS — K591 Functional diarrhea: Secondary | ICD-10-CM | POA: Diagnosis not present

## 2016-01-10 DIAGNOSIS — Z79899 Other long term (current) drug therapy: Secondary | ICD-10-CM | POA: Diagnosis not present

## 2016-01-31 DIAGNOSIS — M1712 Unilateral primary osteoarthritis, left knee: Secondary | ICD-10-CM | POA: Diagnosis not present

## 2016-05-07 DIAGNOSIS — M47896 Other spondylosis, lumbar region: Secondary | ICD-10-CM | POA: Diagnosis not present

## 2016-05-07 DIAGNOSIS — M7062 Trochanteric bursitis, left hip: Secondary | ICD-10-CM | POA: Diagnosis not present

## 2016-05-07 DIAGNOSIS — M47816 Spondylosis without myelopathy or radiculopathy, lumbar region: Secondary | ICD-10-CM | POA: Diagnosis not present

## 2016-05-07 DIAGNOSIS — M542 Cervicalgia: Secondary | ICD-10-CM | POA: Diagnosis not present

## 2016-06-23 ENCOUNTER — Inpatient Hospital Stay: Admit: 2016-06-23 | Payer: Medicare HMO | Admitting: Orthopedic Surgery

## 2016-06-23 SURGERY — ARTHROPLASTY, KNEE, TOTAL
Anesthesia: Choice | Site: Knee | Laterality: Left

## 2016-07-03 ENCOUNTER — Other Ambulatory Visit: Payer: Self-pay | Admitting: Internal Medicine

## 2016-07-03 DIAGNOSIS — Z1231 Encounter for screening mammogram for malignant neoplasm of breast: Secondary | ICD-10-CM

## 2016-07-09 NOTE — Progress Notes (Signed)
Pt is being scheduled for preop appt; please place surgical orders in epic. Thanks.  

## 2016-07-10 DIAGNOSIS — I10 Essential (primary) hypertension: Secondary | ICD-10-CM | POA: Diagnosis not present

## 2016-07-10 DIAGNOSIS — H524 Presbyopia: Secondary | ICD-10-CM | POA: Diagnosis not present

## 2016-07-17 ENCOUNTER — Ambulatory Visit
Admission: RE | Admit: 2016-07-17 | Discharge: 2016-07-17 | Disposition: A | Discharge status: Home / Self Care | Payer: Medicare HMO | Source: Ambulatory Visit | Attending: Internal Medicine | Admitting: Internal Medicine

## 2016-07-17 DIAGNOSIS — Z0001 Encounter for general adult medical examination with abnormal findings: Secondary | ICD-10-CM | POA: Diagnosis not present

## 2016-07-17 DIAGNOSIS — Z1231 Encounter for screening mammogram for malignant neoplasm of breast: Secondary | ICD-10-CM | POA: Diagnosis not present

## 2016-07-17 DIAGNOSIS — R875 Abnormal microbiological findings in specimens from female genital organs: Secondary | ICD-10-CM | POA: Diagnosis not present

## 2016-07-17 DIAGNOSIS — Z1272 Encounter for screening for malignant neoplasm of vagina: Secondary | ICD-10-CM | POA: Diagnosis not present

## 2016-07-17 DIAGNOSIS — Z1389 Encounter for screening for other disorder: Secondary | ICD-10-CM | POA: Diagnosis not present

## 2016-07-17 DIAGNOSIS — N898 Other specified noninflammatory disorders of vagina: Secondary | ICD-10-CM | POA: Diagnosis not present

## 2016-07-20 ENCOUNTER — Ambulatory Visit: Payer: Self-pay | Admitting: Orthopedic Surgery

## 2016-07-24 DIAGNOSIS — R69 Illness, unspecified: Secondary | ICD-10-CM | POA: Diagnosis not present

## 2016-07-31 DIAGNOSIS — N898 Other specified noninflammatory disorders of vagina: Secondary | ICD-10-CM | POA: Diagnosis not present

## 2016-08-05 ENCOUNTER — Encounter (HOSPITAL_COMMUNITY): Payer: Self-pay

## 2016-08-07 DIAGNOSIS — R69 Illness, unspecified: Secondary | ICD-10-CM | POA: Diagnosis not present

## 2016-08-07 NOTE — Patient Instructions (Addendum)
Okla Albor Harston  08/07/2016   Your procedure is scheduled on: 08-18-16   Report to [facility] Main  Entrance take [facility]  elevators to 3rd floor to  [facility] at  0930  AM.  Call this number if you have problems the morning of surgery [phone]   Remember: ONLY 1 PERSON MAY GO WITH YOU TO SHORT STAY TO GET  READY MORNING OF [place].  Do not eat food or drink liquids :After Midnight.     Take these medicines the morning of surgery with A SIP OF WATER: Estradiol. Tramadol if need DO NOT TAKE ANY DIABETIC MEDICATIONS DAY OF YOUR SURGERY                               You may not have any metal on your body including hair pins and              piercings  Do not wear jewelry, make-up, lotions, powders or perfumes, deodorant             Do not wear nail polish.  Do not shave  48 hours prior to surgery.              Men may shave face and neck.   Do not bring valuables to the hospital. [place].  Contacts, dentures or bridgework may not be worn into surgery.  Leave suitcase in the car. After surgery it may be brought to your room.     Patients discharged the day of surgery will not be allowed to drive home.  Name and phone number of your driver: Gwyneth Revels [phone] cell              Please read over the following fact sheets you were given: _____________________________________________________________________             [facility] - Preparing for Surgery Before surgery, you can play an important role.  Because skin is not sterile, your skin needs to be as free of germs as possible.  You can reduce the number of germs on your skin by washing with CHG (chlorahexidine gluconate) soap before surgery.  CHG is an antiseptic cleaner which kills germs and bonds with the skin to continue killing germs even after washing. Please DO NOT use if you have an allergy to CHG or antibacterial soaps.   If your skin becomes reddened/irritated stop using the CHG and inform your nurse when you arrive at Short Stay. Do not shave (including legs and underarms) for at least 48 hours prior to the first CHG shower.  You may shave your face/neck. Please follow these instructions carefully:  1.  Shower with CHG Soap the night before surgery and the  morning of Surgery.  2.  If you choose to wash your hair, wash your hair first as usual with your  normal  shampoo.  3.  After you shampoo, rinse your hair and body thoroughly to remove the  shampoo.                           4.  Use CHG as you would any other liquid soap.  You can apply chg directly  to  the skin and wash                       Gently with a scrungie or clean washcloth.  5.  Apply the CHG Soap to your body ONLY FROM THE NECK DOWN.   Do not use on face/ open                           Wound or open sores. Avoid contact with eyes, ears mouth and genitals (private parts).                       Wash face,  Genitals (private parts) with your normal soap.             6.  Wash thoroughly, paying special attention to the area where your surgery  will be performed.  7.  Thoroughly rinse your body with warm water from the neck down.  8.  DO NOT shower/wash with your normal soap after using and rinsing off  the CHG Soap.                9.  Pat yourself dry with a clean towel.            10.  Wear clean pajamas.            11.  Place clean sheets on your bed the night of your first shower and do not  sleep with pets. Day of Surgery : Do not apply any lotions/deodorants the morning of surgery.  Please wear clean clothes to the hospital/surgery center.  FAILURE TO FOLLOW THESE INSTRUCTIONS MAY RESULT IN THE CANCELLATION OF YOUR SURGERY PATIENT SIGNATURE_________________________________  NURSE SIGNATURE__________________________________  ________________________________________________________________________   Adam Phenix  An incentive  spirometer is a tool that can help keep your lungs clear and active. This tool measures how well you are filling your lungs with each breath. Taking long deep breaths may help reverse or decrease the chance of developing breathing (pulmonary) problems (especially infection) following:  A long period of time when you are unable to move or be active. BEFORE THE PROCEDURE   If the spirometer includes an indicator to show your best effort, your nurse or respiratory therapist will set it to a desired goal.  If possible, sit up straight or lean slightly forward. Try not to slouch.  Hold the incentive spirometer in an upright position. INSTRUCTIONS FOR USE  1. Sit on the edge of your bed if possible, or sit up as far as you can in bed or on a chair. 2. Hold the incentive spirometer in an upright position. 3. Breathe out normally. 4. Place the mouthpiece in your mouth and seal your lips tightly around it. 5. Breathe in slowly and as deeply as possible, raising the piston or the ball toward the top of the column. 6. Hold your breath for 3-5 seconds or for as long as possible. Allow the piston or ball to fall to the bottom of the column. 7. Remove the mouthpiece from your mouth and breathe out normally. 8. Rest for a few seconds and repeat Steps 1 through 7 at least 10 times every 1-2 hours when you are awake. Take your time and take a few normal breaths between deep breaths. 9. The spirometer may include an indicator to show your best effort. Use the indicator as a goal to work toward during each repetition. 10. After each set  of 10 deep breaths, practice coughing to be sure your lungs are clear. If you have an incision (the cut made at the time of surgery), support your incision when coughing by placing a pillow or rolled up towels firmly against it. Once you are able to get out of bed, walk around indoors and cough well. You may stop using the incentive spirometer when instructed by your caregiver.   RISKS AND COMPLICATIONS  Take your time so you do not get dizzy or light-headed.  If you are in pain, you may need to take or ask for pain medication before doing incentive spirometry. It is harder to take a deep breath if you are having pain. AFTER USE  Rest and breathe slowly and easily.  It can be helpful to keep track of a log of your progress. Your caregiver can provide you with a simple table to help with this. If you are using the spirometer at home, follow these instructions: [place] IF:   You are having difficultly using the spirometer.  You have trouble using the spirometer as often as instructed.  Your pain medication is not giving enough relief while using the spirometer.  You develop fever of 100.5 F (38.1 C) or higher. SEEK IMMEDIATE MEDICAL CARE IF:   You cough up bloody sputum that had not been present before.  You develop fever of 102 F (38.9 C) or greater.  You develop worsening pain at or near the incision site. MAKE SURE YOU:   Understand these instructions.  Will watch your condition.  Will get help right away if you are not doing well or get worse. Document Released: 02/09/2007 Document Revised: 12/22/2011 Document Reviewed: 04/12/2007 ExitCare Patient Information 2014 ExitCare, [place].   ________________________________________________________________________  WHAT IS A BLOOD TRANSFUSION? Blood Transfusion Information  A transfusion is the replacement of blood or some of its parts. Blood is made up of multiple cells which provide different functions.  Red blood cells carry oxygen and are used for blood loss replacement.  White blood cells fight against infection.  Platelets control bleeding.  Plasma helps clot blood.  Other blood products are available for specialized needs, such as hemophilia or other clotting disorders. BEFORE THE TRANSFUSION  Who gives blood for transfusions?   Healthy volunteers who are fully evaluated  to make sure their blood is safe. This is blood bank blood. Transfusion therapy is the safest it has ever been in the practice of medicine. Before blood is taken from a donor, a complete history is taken to make sure that person has no history of diseases nor engages in risky social behavior (examples are intravenous drug use or sexual activity with multiple partners). The donor's travel history is screened to minimize risk of transmitting infections, such as malaria. The donated blood is tested for signs of infectious diseases, such as HIV and hepatitis. The blood is then tested to be sure it is compatible with you in order to minimize the chance of a transfusion reaction. If you or a relative donates blood, this is often done in anticipation of surgery and is not appropriate for emergency situations. It takes many days to process the donated blood. RISKS AND COMPLICATIONS Although transfusion therapy is very safe and saves many lives, the main dangers of transfusion include:   Getting an infectious disease.  Developing a transfusion reaction. This is an allergic reaction to something in the blood you were given. Every precaution is taken to prevent this. The decision to have a  blood transfusion has been considered carefully by your caregiver before blood is given. Blood is not given unless the benefits outweigh the risks. AFTER THE TRANSFUSION  Right after receiving a blood transfusion, you will usually feel much better and more energetic. This is especially true if your red blood cells have gotten low (anemic). The transfusion raises the level of the red blood cells which carry oxygen, and this usually causes an energy increase.  The nurse administering the transfusion will monitor you carefully for complications. HOME CARE INSTRUCTIONS  No special instructions are needed after a transfusion. You may find your energy is better. Speak with your caregiver about any limitations on activity for  underlying diseases you may have. SEEK MEDICAL CARE IF:   Your condition is not improving after your transfusion.  You develop redness or irritation at the intravenous (IV) site. SEEK IMMEDIATE MEDICAL CARE IF:  Any of the following symptoms occur over the next 12 hours:  Shaking chills.  You have a temperature by mouth above 102 F (38.9 C), not controlled by medicine.  Chest, back, or muscle pain.  People around you feel you are not acting correctly or are confused.  Shortness of breath or difficulty breathing.  Dizziness and fainting.  You get a rash or develop hives.  You have a decrease in urine output.  Your urine turns a dark color or changes to pink, red, or brown. Any of the following symptoms occur over the next 10 days:  You have a temperature by mouth above 102 F (38.9 C), not controlled by medicine.  Shortness of breath.  Weakness after normal activity.  The white part of the eye turns yellow (jaundice).  You have a decrease in the amount of urine or are urinating less often.  Your urine turns a dark color or changes to pink, red, or brown. Document Released: 09/26/2000 Document Revised: 12/22/2011 Document Reviewed: 05/15/2008 [facility] Patient Information 2014 [place], [place].  _______________________________________________________________________

## 2016-08-08 ENCOUNTER — Encounter (HOSPITAL_COMMUNITY): Payer: Self-pay

## 2016-08-08 ENCOUNTER — Encounter (HOSPITAL_COMMUNITY)
Admission: RE | Admit: 2016-08-08 | Discharge: 2016-08-08 | Disposition: A | Discharge status: Home / Self Care | Payer: Medicare HMO | Source: Ambulatory Visit | Attending: Orthopedic Surgery | Admitting: Orthopedic Surgery

## 2016-08-08 DIAGNOSIS — M1712 Unilateral primary osteoarthritis, left knee: Secondary | ICD-10-CM | POA: Insufficient documentation

## 2016-08-08 DIAGNOSIS — Z01818 Encounter for other preprocedural examination: Secondary | ICD-10-CM | POA: Diagnosis not present

## 2016-08-08 DIAGNOSIS — I10 Essential (primary) hypertension: Secondary | ICD-10-CM | POA: Diagnosis not present

## 2016-08-08 HISTORY — DX: Personal history of other medical treatment: Z92.89

## 2016-08-08 HISTORY — DX: Unspecified osteoarthritis, unspecified site: M19.90

## 2016-08-08 LAB — URINALYSIS, ROUTINE W REFLEX MICROSCOPIC
Bilirubin Urine: NEGATIVE
Glucose, UA: NEGATIVE mg/dL
Hgb urine dipstick: NEGATIVE
KETONES UR: NEGATIVE mg/dL
LEUKOCYTES UA: NEGATIVE
NITRITE: NEGATIVE
PROTEIN: NEGATIVE mg/dL
Specific Gravity, Urine: 1.012 (ref 1.005–1.030)
pH: 5.5 (ref 5.0–8.0)

## 2016-08-08 LAB — COMPREHENSIVE METABOLIC PANEL
ALK PHOS: 106 U/L (ref 38–126)
ALT: 22 U/L (ref 14–54)
AST: 26 U/L (ref 15–41)
Albumin: 4.1 g/dL (ref 3.5–5.0)
Anion gap: 6 (ref 5–15)
BUN: 12 mg/dL (ref 6–20)
CALCIUM: 9.4 mg/dL (ref 8.9–10.3)
CHLORIDE: 104 mmol/L (ref 101–111)
CO2: 31 mmol/L (ref 22–32)
CREATININE: 0.97 mg/dL (ref 0.44–1.00)
Glucose, Bld: 87 mg/dL (ref 65–99)
Potassium: 4.2 mmol/L (ref 3.5–5.1)
Sodium: 141 mmol/L (ref 135–145)
Total Bilirubin: 0.4 mg/dL (ref 0.3–1.2)
Total Protein: 7.5 g/dL (ref 6.5–8.1)

## 2016-08-08 LAB — CBC
HEMATOCRIT: 42.4 % (ref 36.0–46.0)
HEMOGLOBIN: 14.4 g/dL (ref 12.0–15.0)
MCH: 27.4 pg (ref 26.0–34.0)
MCHC: 34 g/dL (ref 30.0–36.0)
MCV: 80.6 fL (ref 78.0–100.0)
PLATELETS: 356 10*3/uL (ref 150–400)
RBC: 5.26 MIL/uL — AB (ref 3.87–5.11)
RDW: 14.8 % (ref 11.5–15.5)
WBC: 8.7 10*3/uL (ref 4.0–10.5)

## 2016-08-08 LAB — SURGICAL PCR SCREEN
MRSA, PCR: NEGATIVE
Staphylococcus aureus: NEGATIVE

## 2016-08-08 LAB — PROTIME-INR
INR: 1
PROTHROMBIN TIME: 13.2 s (ref 11.4–15.2)

## 2016-08-08 LAB — APTT: aPTT: 39 seconds — ABNORMAL HIGH (ref 24–36)

## 2016-08-08 NOTE — Pre-Procedure Instructions (Signed)
EKG done today.

## 2016-08-08 NOTE — Pre-Procedure Instructions (Signed)
Lab results done 08-08-16 with PAT appointment faxed per Epic to Dr. Laqueta Due per request.

## 2016-08-11 ENCOUNTER — Encounter (HOSPITAL_COMMUNITY): Payer: Self-pay

## 2016-08-11 NOTE — Pre-Procedure Instructions (Signed)
08-11-16 0925 EKG of 08-08-16 compared to EkG of 2012 with chart By Dr. Therisa Doyne "okay".

## 2016-08-17 ENCOUNTER — Ambulatory Visit: Payer: Self-pay | Admitting: Orthopedic Surgery

## 2016-08-17 NOTE — H&P (Signed)
Jennifer [name] DOB: [birth year] Married / Language: English / Race: Black or African American Female Date of Admission:  08/18/2016 CC:  Left Knee Pain History of Present Illness The patient is a 55 year old female who comes in  for a preoperative History and Physical. The patient is scheduled for a left total knee arthroplasty to be performed by Dr. Dione Plover. Aluisio, MD at [facility] on 08/18/2016. The patient is a 55 year old female who presented with knee complaints. The patient was seen for a second opinion. The patient reports left knee symptoms including: pain, swelling, instability, giving way, weakness and stiffness which began year(s) ago without any known injury.The patient feels that the symptoms are worsening. Prior to being seen the patient was previously evaluated by [facility]. Past treatment for this problem has included intra-articular injection of corticosteroids. Symptoms are exacerbated by weight bearing, walking, kneeling and squatting. Current treatment includes knee brace (PRN, Tramadol and Gabapentin daily). Currently both knees bother her, but the left is more symptomatic than the right. Left knee is hurting with most all activities. Even hurt some at rest. She does get some night pain. The left knee is limiting what she can and cannot do. She feels as though she would like to be more active, but the left knee is preventing her from doing so. She has had cortisone and viscous supplement injections in the past. She has been treated at [facility]. She has also had her right knee replaced there few years ago. Unfortunately, she is having problems with her right knee giving out. She also has pain associated with that. Did not have any problems initially after the surgery. This developed later on. She said the right is problematic, but the left is currently giving her more trouble. She is ready to get the left knee replaced. They have been treated  conservatively in the past for the above stated problem and despite conservative measures, they continue to have progressive pain and severe functional limitations and dysfunction. They have failed non-operative management including home exercise, medications, and injections. It is felt that they would benefit from undergoing total joint replacement. Risks and benefits of the procedure have been discussed with the patient and they elect to proceed with surgery. There are no active contraindications to surgery such as ongoing infection or rapidly progressive neurological disease.   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)  Chronic Pain  High blood pressure  Osteoarthritis   Allergies  Methocarbamol *CHEMICALS*  LODINE [10/28/2002]: SWELLING IN LIP OxyCODONE HCl *ANALGESICS - OPIOID*  Sick - Patient IS ABLE to take Hydrocodone Darvocet-N 100 *ANALGESICS - OPIOID*   Family History  Cancer  mother and father Diabetes Mellitus  grandfather mothers side and brother Hypertension  father  Social History Children  3 Current drinker  01/31/2016: Currently drinks beer only occasionally per week Current work status  disabled Exercise  Exercises weekly; does running / walking Living situation  live alone Marital status  widowed No history of drug/alcohol rehab  Not under pain contract  Number of flights of stairs before winded  4-5 Tobacco use  01/31/2016: never smoker  Medication History Gabapentin (300MG  Capsule, Oral) Active. TraMADol HCl (50MG  Tablet, Oral) Active. Estradiol (0.5MG  Tablet, Oral) Active. HydroCHLOROthiazide (25MG  Tablet, Oral) Active. Cholestyramine (4GM/DOSE Powder, Oral) Active. (PRN)   Past Surgical History Arthroscopy of Knee  right Gallbladder Surgery  Date: 2006. open Hysterectomy  complete (non-cancerous) Neck Disc Surgery  Date:  2003. Spinal Fusion  lower back Spinal Surgery  Total Knee Replacement  Date: 2012. right  Review of Systems General Not Present- Chills, Fatigue, Fever, Memory Loss, Night Sweats, Weight Gain and Weight Loss. Skin Not Present- Eczema, Hives, Itching, Lesions and Rash. HEENT Not Present- Dentures, Double Vision, Headache, Hearing Loss, Tinnitus and Visual Loss. Respiratory Not Present- Allergies, Chronic Cough, Coughing up blood, Shortness of breath at rest and Shortness of breath with exertion. Cardiovascular Not Present- Chest Pain, Difficulty Breathing Lying Down, Murmur, Palpitations, Racing/skipping heartbeats and Swelling. Gastrointestinal Not Present- Abdominal Pain, Bloody Stool, Constipation, Diarrhea, Difficulty Swallowing, Heartburn, Jaundice, Loss of appetitie, Nausea and Vomiting. Female Genitourinary Not Present- Blood in Urine, Discharge, Flank Pain, Incontinence, Painful Urination, Urgency, Urinary frequency, Urinary Retention, Urinating at Night and Weak urinary stream. Musculoskeletal Present- Back Pain, Joint Pain, Joint Swelling, Morning Stiffness and Muscle Weakness. Not Present- Muscle Pain and Spasms. Neurological Not Present- Blackout spells, Difficulty with balance, Dizziness, Paralysis, Tremor and Weakness. Psychiatric Not Present- Insomnia.  Vitals  Weight: 205 lb Height: 65in Body Surface Area: 2 m Body Mass Index: 34.11 kg/m  Pulse: 64 (Regular)  BP: 124/82 (Sitting, Right Arm, Standard)   Physical Exam  General Mental Status -Alert, cooperative and good historian. General Appearance-pleasant, Not in acute distress. Orientation-Oriented X3. Build & Nutrition-Well nourished and Well developed.  Head and Neck Head-normocephalic, atraumatic . Neck Global Assessment - supple, no bruit auscultated on the right, no bruit auscultated on the left.  Eye Pupil - Bilateral-Regular and Round. Motion - Bilateral-EOMI.  Chest and  Lung Exam Auscultation Breath sounds - clear at anterior chest wall and clear at posterior chest wall. Adventitious sounds - No Adventitious sounds.  Cardiovascular Auscultation Rhythm - Regular rate and rhythm. Heart Sounds - S1 WNL and S2 WNL. Murmurs & Other Heart Sounds - Auscultation of the heart reveals - No Murmurs.  Abdomen Palpation/Percussion Tenderness - Abdomen is non-tender to palpation. Rigidity (guarding) - Abdomen is soft. Auscultation Auscultation of the abdomen reveals - Bowel sounds normal.  Female Genitourinary Note: Not done, not pertinent to present illness   Musculoskeletal Note: On exam, well-developed female, alert and oriented, in no apparent distress. Evaluation of her left hip shows normal range of motion with no discomfort. Her left knee shows no effusion. Range is about 5 to 125 with moderate crepitus on range of motion, tenderness medial greater than lateral with no instability noted. Right knee, well-healed anterior incision. She hyperextends about 5 degrees, has a fair amount of varus and valgus laxity in extension. She can flex down about 100 and has a fair amount of AP laxity in flexion.  RADIOGRAPHS AP and lateral of both knees show the prosthesis on the right appears to be well aligned with no definitive periprosthetic abnormalities. On the left, she has got bone-on-bone arthritis, medial and patellofemoral compartments.   Assessment & Plan Primary osteoarthritis of left knee (M17.12)  Note:Surgical Plans: Left Total Knee Replacement  Disposition: Home  PCP: Dr. Lorine Bears  IV TXA  Signed electronically by Ok Edwards, III PA-C

## 2016-08-18 ENCOUNTER — Inpatient Hospital Stay (HOSPITAL_COMMUNITY)
Admission: RE | Admit: 2016-08-18 | Discharge: 2016-08-20 | DRG: 470 | Disposition: A | Discharge status: Home Health | Payer: Medicare HMO | Source: Ambulatory Visit | Attending: Orthopedic Surgery | Admitting: Orthopedic Surgery

## 2016-08-18 ENCOUNTER — Inpatient Hospital Stay (HOSPITAL_COMMUNITY): Payer: Medicare HMO | Admitting: Anesthesiology

## 2016-08-18 ENCOUNTER — Encounter (HOSPITAL_COMMUNITY)
Admission: RE | Disposition: A | Discharge status: Home Health | Payer: Self-pay | Source: Ambulatory Visit | Attending: Orthopedic Surgery

## 2016-08-18 ENCOUNTER — Encounter (HOSPITAL_COMMUNITY): Payer: Self-pay | Admitting: *Deleted

## 2016-08-18 DIAGNOSIS — Z96652 Presence of left artificial knee joint: Secondary | ICD-10-CM | POA: Diagnosis not present

## 2016-08-18 DIAGNOSIS — M4690 Unspecified inflammatory spondylopathy, site unspecified: Secondary | ICD-10-CM | POA: Diagnosis present

## 2016-08-18 DIAGNOSIS — M1710 Unilateral primary osteoarthritis, unspecified knee: Secondary | ICD-10-CM

## 2016-08-18 DIAGNOSIS — Z833 Family history of diabetes mellitus: Secondary | ICD-10-CM

## 2016-08-18 DIAGNOSIS — Z8249 Family history of ischemic heart disease and other diseases of the circulatory system: Secondary | ICD-10-CM

## 2016-08-18 DIAGNOSIS — M25562 Pain in left knee: Secondary | ICD-10-CM | POA: Diagnosis present

## 2016-08-18 DIAGNOSIS — I10 Essential (primary) hypertension: Secondary | ICD-10-CM | POA: Diagnosis not present

## 2016-08-18 DIAGNOSIS — Z809 Family history of malignant neoplasm, unspecified: Secondary | ICD-10-CM

## 2016-08-18 DIAGNOSIS — M179 Osteoarthritis of knee, unspecified: Secondary | ICD-10-CM | POA: Diagnosis present

## 2016-08-18 DIAGNOSIS — M1712 Unilateral primary osteoarthritis, left knee: Secondary | ICD-10-CM | POA: Diagnosis not present

## 2016-08-18 HISTORY — PX: TOTAL KNEE ARTHROPLASTY: SHX125

## 2016-08-18 LAB — TYPE AND SCREEN
ABO/RH(D): O POS
Antibody Screen: NEGATIVE

## 2016-08-18 SURGERY — ARTHROPLASTY, KNEE, TOTAL
Anesthesia: Spinal | Site: Knee | Laterality: Left

## 2016-08-18 MED ORDER — CEFAZOLIN SODIUM-DEXTROSE 2-4 GM/100ML-% IV SOLN
INTRAVENOUS | Status: AC
  Filled 2016-08-18: qty 100

## 2016-08-18 MED ORDER — ACETAMINOPHEN 500 MG PO TABS
1000.0000 mg | ORAL_TABLET | Freq: Four times a day (QID) | ORAL | Status: AC
  Filled 2016-08-18: qty 2

## 2016-08-18 MED ORDER — CEFAZOLIN SODIUM-DEXTROSE 2-4 GM/100ML-% IV SOLN
2.0000 g | Freq: Four times a day (QID) | INTRAVENOUS | Status: AC
  Administered 2016-08-18 – 2016-08-19 (×2): 2 g via INTRAVENOUS
  Filled 2016-08-18 (×2): qty 100

## 2016-08-18 MED ORDER — FENTANYL CITRATE (PF) 100 MCG/2ML IJ SOLN
INTRAMUSCULAR | Status: AC
  Filled 2016-08-18: qty 2

## 2016-08-18 MED ORDER — HYDROMORPHONE HCL 1 MG/ML IJ SOLN
INTRAMUSCULAR | Status: AC
  Administered 2016-08-18: 0.5 mg via INTRAVENOUS
  Filled 2016-08-18: qty 1

## 2016-08-18 MED ORDER — SODIUM CHLORIDE 0.9 % IJ SOLN
INTRAMUSCULAR | Status: AC
  Filled 2016-08-18: qty 50

## 2016-08-18 MED ORDER — GABAPENTIN 300 MG PO CAPS
300.0000 mg | ORAL_CAPSULE | Freq: Every day | ORAL | Status: DC
  Administered 2016-08-18 – 2016-08-19 (×2): 300 mg via ORAL
  Filled 2016-08-18 (×2): qty 1

## 2016-08-18 MED ORDER — LACTATED RINGERS IV SOLN: INTRAVENOUS | Status: DC

## 2016-08-18 MED ORDER — FENTANYL CITRATE (PF) 100 MCG/2ML IJ SOLN
INTRAMUSCULAR | Status: AC
  Filled 2016-08-18: qty 4

## 2016-08-18 MED ORDER — LIDOCAINE 2% (20 MG/ML) 5 ML SYRINGE
INTRAMUSCULAR | Status: AC
  Filled 2016-08-18: qty 5

## 2016-08-18 MED ORDER — BUPIVACAINE LIPOSOME 1.3 % IJ SUSP
INTRAMUSCULAR | Status: DC | PRN
  Administered 2016-08-18: 20 mL

## 2016-08-18 MED ORDER — MENTHOL 3 MG MT LOZG
1.0000 | LOZENGE | OROMUCOSAL | Status: DC | PRN
  Filled 2016-08-18: qty 9

## 2016-08-18 MED ORDER — 0.9 % SODIUM CHLORIDE (POUR BTL) OPTIME
TOPICAL | Status: DC | PRN
Start: 2016-08-18 — End: 2016-08-18
  Administered 2016-08-18: 1000 mL

## 2016-08-18 MED ORDER — ACETAMINOPHEN 650 MG RE SUPP: 650.0000 mg | Freq: Four times a day (QID) | RECTAL | Status: DC | PRN

## 2016-08-18 MED ORDER — STERILE WATER FOR IRRIGATION IR SOLN
Status: DC | PRN
  Administered 2016-08-18: 2000 mL

## 2016-08-18 MED ORDER — SODIUM CHLORIDE 0.9 % IR SOLN
Status: DC | PRN
  Administered 2016-08-18: 1000 mL

## 2016-08-18 MED ORDER — SUGAMMADEX SODIUM 200 MG/2ML IV SOLN
INTRAVENOUS | Status: DC | PRN
  Administered 2016-08-18: 200 mg via INTRAVENOUS

## 2016-08-18 MED ORDER — FENTANYL CITRATE (PF) 100 MCG/2ML IJ SOLN
INTRAMUSCULAR | Status: DC | PRN
  Administered 2016-08-18 (×4): 50 ug via INTRAVENOUS
  Administered 2016-08-18: 100 ug via INTRAVENOUS
  Administered 2016-08-18 (×2): 50 ug via INTRAVENOUS

## 2016-08-18 MED ORDER — DEXAMETHASONE SODIUM PHOSPHATE 10 MG/ML IJ SOLN
10.0000 mg | Freq: Once | INTRAMUSCULAR | Status: AC
  Administered 2016-08-18: 10 mg via INTRAVENOUS

## 2016-08-18 MED ORDER — ACETAMINOPHEN 10 MG/ML IV SOLN
INTRAVENOUS | Status: AC
  Filled 2016-08-18: qty 100

## 2016-08-18 MED ORDER — PROPOFOL 10 MG/ML IV BOLUS
INTRAVENOUS | Status: DC | PRN
  Administered 2016-08-18: 30 mg via INTRAVENOUS
  Administered 2016-08-18: 100 mg via INTRAVENOUS
  Administered 2016-08-18: 30 mg via INTRAVENOUS

## 2016-08-18 MED ORDER — CHLORHEXIDINE GLUCONATE 4 % EX LIQD: 60.0000 mL | Freq: Once | CUTANEOUS | Status: DC

## 2016-08-18 MED ORDER — MIDAZOLAM HCL 2 MG/2ML IJ SOLN
INTRAMUSCULAR | Status: AC
  Filled 2016-08-18: qty 2

## 2016-08-18 MED ORDER — ONDANSETRON HCL 4 MG PO TABS: 4.0000 mg | ORAL_TABLET | Freq: Four times a day (QID) | ORAL | Status: DC | PRN

## 2016-08-18 MED ORDER — DOCUSATE SODIUM 100 MG PO CAPS
100.0000 mg | ORAL_CAPSULE | Freq: Two times a day (BID) | ORAL | Status: DC
  Administered 2016-08-18 – 2016-08-19 (×3): 100 mg via ORAL
  Filled 2016-08-18 (×4): qty 1

## 2016-08-18 MED ORDER — ROCURONIUM BROMIDE 10 MG/ML (PF) SYRINGE
PREFILLED_SYRINGE | INTRAVENOUS | Status: DC | PRN
  Administered 2016-08-18: 20 mg via INTRAVENOUS

## 2016-08-18 MED ORDER — LACTATED RINGERS IV SOLN
INTRAVENOUS | Status: DC
  Administered 2016-08-18 (×2): via INTRAVENOUS

## 2016-08-18 MED ORDER — LABETALOL HCL 5 MG/ML IV SOLN
INTRAVENOUS | Status: AC
  Filled 2016-08-18: qty 4

## 2016-08-18 MED ORDER — DIPHENHYDRAMINE HCL 12.5 MG/5ML PO ELIX: 12.5000 mg | ORAL_SOLUTION | ORAL | Status: DC | PRN

## 2016-08-18 MED ORDER — ONDANSETRON HCL 4 MG/2ML IJ SOLN
INTRAMUSCULAR | Status: AC
  Filled 2016-08-18: qty 2

## 2016-08-18 MED ORDER — RIVAROXABAN 10 MG PO TABS
10.0000 mg | ORAL_TABLET | Freq: Every day | ORAL | Status: DC
  Administered 2016-08-19 – 2016-08-20 (×2): 10 mg via ORAL
  Filled 2016-08-18 (×2): qty 1

## 2016-08-18 MED ORDER — SODIUM CHLORIDE 0.9 % IV SOLN
INTRAVENOUS | Status: DC
  Administered 2016-08-18: 17:00:00 via INTRAVENOUS

## 2016-08-18 MED ORDER — METOCLOPRAMIDE HCL 5 MG/ML IJ SOLN: 5.0000 mg | Freq: Three times a day (TID) | INTRAMUSCULAR | Status: DC | PRN

## 2016-08-18 MED ORDER — ACETAMINOPHEN 325 MG PO TABS: 650.0000 mg | ORAL_TABLET | Freq: Four times a day (QID) | ORAL | Status: DC | PRN

## 2016-08-18 MED ORDER — CEFAZOLIN SODIUM-DEXTROSE 2-4 GM/100ML-% IV SOLN
2.0000 g | INTRAVENOUS | Status: AC
  Administered 2016-08-18: 2 g via INTRAVENOUS
  Filled 2016-08-18: qty 100

## 2016-08-18 MED ORDER — LIDOCAINE 2% (20 MG/ML) 5 ML SYRINGE
INTRAMUSCULAR | Status: DC | PRN
  Administered 2016-08-18: 60 mg via INTRAVENOUS

## 2016-08-18 MED ORDER — MIDAZOLAM HCL 5 MG/5ML IJ SOLN
INTRAMUSCULAR | Status: DC | PRN
  Administered 2016-08-18 (×2): 1 mg via INTRAVENOUS

## 2016-08-18 MED ORDER — METOCLOPRAMIDE HCL 5 MG PO TABS: 5.0000 mg | ORAL_TABLET | Freq: Three times a day (TID) | ORAL | Status: DC | PRN

## 2016-08-18 MED ORDER — HYDROMORPHONE HCL 1 MG/ML IJ SOLN
0.5000 mg | INTRAMUSCULAR | Status: DC | PRN
  Administered 2016-08-18: 0.5 mg via INTRAVENOUS
  Filled 2016-08-18: qty 1

## 2016-08-18 MED ORDER — POLYETHYLENE GLYCOL 3350 17 G PO PACK: 17.0000 g | PACK | Freq: Every day | ORAL | Status: DC | PRN

## 2016-08-18 MED ORDER — SODIUM CHLORIDE 0.9 % IJ SOLN
INTRAMUSCULAR | Status: DC | PRN
  Administered 2016-08-18: 30 mL

## 2016-08-18 MED ORDER — HYDROCHLOROTHIAZIDE 25 MG PO TABS
25.0000 mg | ORAL_TABLET | Freq: Every day | ORAL | Status: DC
Start: 2016-08-19 — End: 2016-08-20
  Administered 2016-08-20: 25 mg via ORAL
  Filled 2016-08-18: qty 1

## 2016-08-18 MED ORDER — ONDANSETRON HCL 4 MG/2ML IJ SOLN: 4.0000 mg | Freq: Four times a day (QID) | INTRAMUSCULAR | Status: DC | PRN

## 2016-08-18 MED ORDER — DEXAMETHASONE SODIUM PHOSPHATE 10 MG/ML IJ SOLN
INTRAMUSCULAR | Status: AC
  Filled 2016-08-18: qty 1

## 2016-08-18 MED ORDER — SUCCINYLCHOLINE CHLORIDE 200 MG/10ML IV SOSY
PREFILLED_SYRINGE | INTRAVENOUS | Status: DC | PRN
  Administered 2016-08-18: 100 mg via INTRAVENOUS

## 2016-08-18 MED ORDER — TRANEXAMIC ACID 1000 MG/10ML IV SOLN
1000.0000 mg | INTRAVENOUS | Status: AC
  Administered 2016-08-18: 1000 mg via INTRAVENOUS
  Filled 2016-08-18: qty 1100

## 2016-08-18 MED ORDER — DEXAMETHASONE SODIUM PHOSPHATE 10 MG/ML IJ SOLN
10.0000 mg | Freq: Once | INTRAMUSCULAR | Status: AC
  Administered 2016-08-19: 10 mg via INTRAVENOUS
  Filled 2016-08-18: qty 1

## 2016-08-18 MED ORDER — FLEET ENEMA 7-19 GM/118ML RE ENEM: 1.0000 | ENEMA | Freq: Once | RECTAL | Status: DC | PRN

## 2016-08-18 MED ORDER — OXYCODONE HCL 5 MG PO TABS
5.0000 mg | ORAL_TABLET | ORAL | Status: DC | PRN
  Administered 2016-08-18: 5 mg via ORAL
  Administered 2016-08-18: 10 mg via ORAL
  Administered 2016-08-18: 5 mg via ORAL
  Administered 2016-08-19 – 2016-08-20 (×8): 10 mg via ORAL
  Filled 2016-08-18: qty 1
  Filled 2016-08-18 (×5): qty 2
  Filled 2016-08-18: qty 1
  Filled 2016-08-18 (×5): qty 2

## 2016-08-18 MED ORDER — ACETAMINOPHEN 10 MG/ML IV SOLN
1000.0000 mg | Freq: Once | INTRAVENOUS | Status: AC
  Administered 2016-08-18: 1000 mg via INTRAVENOUS

## 2016-08-18 MED ORDER — TRAMADOL HCL 50 MG PO TABS: 50.0000 mg | ORAL_TABLET | Freq: Four times a day (QID) | ORAL | Status: DC | PRN

## 2016-08-18 MED ORDER — BUPIVACAINE LIPOSOME 1.3 % IJ SUSP
20.0000 mL | Freq: Once | INTRAMUSCULAR | Status: DC
  Filled 2016-08-18: qty 20

## 2016-08-18 MED ORDER — ROCURONIUM BROMIDE 50 MG/5ML IV SOSY
PREFILLED_SYRINGE | INTRAVENOUS | Status: AC
  Filled 2016-08-18: qty 5

## 2016-08-18 MED ORDER — PROPOFOL 10 MG/ML IV BOLUS
INTRAVENOUS | Status: AC
  Filled 2016-08-18: qty 60

## 2016-08-18 MED ORDER — TRANEXAMIC ACID 1000 MG/10ML IV SOLN
1000.0000 mg | Freq: Once | INTRAVENOUS | Status: AC
  Administered 2016-08-18: 1000 mg via INTRAVENOUS
  Filled 2016-08-18: qty 10

## 2016-08-18 MED ORDER — BUPIVACAINE HCL 0.25 % IJ SOLN
INTRAMUSCULAR | Status: DC | PRN
  Administered 2016-08-18: 20 mL

## 2016-08-18 MED ORDER — CHOLESTYRAMINE 4 G PO PACK
4.0000 g | PACK | Freq: Three times a day (TID) | ORAL | Status: DC
  Filled 2016-08-18 (×6): qty 1

## 2016-08-18 MED ORDER — BUPIVACAINE HCL (PF) 0.25 % IJ SOLN
INTRAMUSCULAR | Status: AC
  Filled 2016-08-18: qty 30

## 2016-08-18 MED ORDER — PHENOL 1.4 % MT LIQD: 1.0000 | OROMUCOSAL | Status: DC | PRN

## 2016-08-18 MED ORDER — PROMETHAZINE HCL 25 MG/ML IJ SOLN: 6.2500 mg | INTRAMUSCULAR | Status: DC | PRN

## 2016-08-18 MED ORDER — LABETALOL HCL 5 MG/ML IV SOLN
INTRAVENOUS | Status: DC | PRN
  Administered 2016-08-18 (×2): 5 mg via INTRAVENOUS

## 2016-08-18 MED ORDER — PROPOFOL 500 MG/50ML IV EMUL
INTRAVENOUS | Status: DC | PRN
  Administered 2016-08-18: 75 ug/kg/min via INTRAVENOUS

## 2016-08-18 MED ORDER — BISACODYL 10 MG RE SUPP: 10.0000 mg | Freq: Every day | RECTAL | Status: DC | PRN

## 2016-08-18 MED ORDER — HYDROMORPHONE HCL 1 MG/ML IJ SOLN
0.2500 mg | INTRAMUSCULAR | Status: DC | PRN
  Administered 2016-08-18 (×4): 0.5 mg via INTRAVENOUS

## 2016-08-18 MED ORDER — MEPERIDINE HCL 50 MG/ML IJ SOLN: 6.2500 mg | INTRAMUSCULAR | Status: DC | PRN

## 2016-08-18 MED ORDER — ONDANSETRON HCL 4 MG/2ML IJ SOLN
INTRAMUSCULAR | Status: DC | PRN
  Administered 2016-08-18: 4 mg via INTRAVENOUS

## 2016-08-18 SURGICAL SUPPLY — 50 items
BAG DECANTER FOR FLEXI CONT (MISCELLANEOUS) ×2 IMPLANT
BAG ZIPLOCK 12X15 (MISCELLANEOUS) ×2 IMPLANT
BANDAGE ACE 6X5 VEL STRL LF (GAUZE/BANDAGES/DRESSINGS) ×2 IMPLANT
BANDAGE ELASTIC 6 VELCRO ST LF (GAUZE/BANDAGES/DRESSINGS) ×2 IMPLANT
BLADE SAG 18X100X1.27 (BLADE) ×2 IMPLANT
BLADE SAW SGTL 11.0X1.19X90.0M (BLADE) ×2 IMPLANT
BOWL SMART MIX CTS (DISPOSABLE) ×2 IMPLANT
CAPT KNEE TOTAL 3 ATTUNE ×2 IMPLANT
CEMENT HV SMART SET (Cement) ×2 IMPLANT
CLOTH BEACON ORANGE TIMEOUT ST (SAFETY) ×2 IMPLANT
CUFF TOURN SGL QUICK 34 (TOURNIQUET CUFF) ×1
CUFF TRNQT CYL 34X4X40X1 (TOURNIQUET CUFF) ×1 IMPLANT
DECANTER SPIKE VIAL GLASS SM (MISCELLANEOUS) ×2 IMPLANT
DRAPE U-SHAPE 47X51 STRL (DRAPES) ×2 IMPLANT
DRSG ADAPTIC 3X8 NADH LF (GAUZE/BANDAGES/DRESSINGS) ×2 IMPLANT
DRSG PAD ABDOMINAL 8X10 ST (GAUZE/BANDAGES/DRESSINGS) ×2 IMPLANT
DURAPREP 26ML APPLICATOR (WOUND CARE) ×2 IMPLANT
ELECT REM PT RETURN 9FT ADLT (ELECTROSURGICAL) ×2
ELECTRODE REM PT RTRN 9FT ADLT (ELECTROSURGICAL) ×1 IMPLANT
EVACUATOR 1/8 PVC DRAIN (DRAIN) ×2 IMPLANT
GAUZE SPONGE 4X4 12PLY STRL (GAUZE/BANDAGES/DRESSINGS) ×2 IMPLANT
GLOVE BIO SURGEON STRL SZ7.5 (GLOVE) IMPLANT
GLOVE BIO SURGEON STRL SZ8 (GLOVE) IMPLANT
GLOVE BIOGEL PI IND STRL 6.5 (GLOVE) ×1 IMPLANT
GLOVE BIOGEL PI IND STRL 8 (GLOVE) ×3 IMPLANT
GLOVE BIOGEL PI INDICATOR 6.5 (GLOVE) ×1
GLOVE BIOGEL PI INDICATOR 8 (GLOVE) ×3
GLOVE SURG SS PI 6.5 STRL IVOR (GLOVE) IMPLANT
GOWN STRL REUS W/TWL LRG LVL3 (GOWN DISPOSABLE) ×2 IMPLANT
GOWN STRL REUS W/TWL XL LVL3 (GOWN DISPOSABLE) IMPLANT
HANDPIECE INTERPULSE COAX TIP (DISPOSABLE) ×1
IMMOBILIZER KNEE 20 (SOFTGOODS) ×4 IMPLANT
IMMOBILIZER KNEE 20 THIGH 36 (SOFTGOODS) ×1 IMPLANT
MANIFOLD NEPTUNE II (INSTRUMENTS) ×2 IMPLANT
PACK TOTAL KNEE CUSTOM (KITS) ×2 IMPLANT
PAD ABD 8X10 STRL (GAUZE/BANDAGES/DRESSINGS) ×2 IMPLANT
PADDING CAST COTTON 6X4 STRL (CAST SUPPLIES) ×2 IMPLANT
POSITIONER SURGICAL ARM (MISCELLANEOUS) ×2 IMPLANT
SET HNDPC FAN SPRY TIP SCT (DISPOSABLE) ×1 IMPLANT
STRIP CLOSURE SKIN 1/2X4 (GAUZE/BANDAGES/DRESSINGS) ×2 IMPLANT
SUT MNCRL AB 4-0 PS2 18 (SUTURE) ×2 IMPLANT
SUT VIC AB 2-0 CT1 27 (SUTURE) ×3
SUT VIC AB 2-0 CT1 TAPERPNT 27 (SUTURE) ×3 IMPLANT
SUT VLOC 180 0 24IN GS25 (SUTURE) ×2 IMPLANT
SYR 50ML LL SCALE MARK (SYRINGE) ×2 IMPLANT
TRAY FOLEY BAG SILVER LF 16FR (CATHETERS) ×2 IMPLANT
TRAY FOLEY W/METER SILVER 16FR (SET/KITS/TRAYS/PACK) IMPLANT
WATER STERILE IRR 1500ML POUR (IV SOLUTION) ×2 IMPLANT
WRAP KNEE MAXI GEL POST OP (GAUZE/BANDAGES/DRESSINGS) ×2 IMPLANT
YANKAUER SUCT BULB TIP 10FT TU (MISCELLANEOUS) ×2 IMPLANT

## 2016-08-18 NOTE — Transfer of Care (Signed)
Immediate Anesthesia Transfer of Care Note  Patient: Jennifer [name]  Procedure(s) Performed: Procedure(s): LEFT TOTAL KNEE ARTHROPLASTY (Left)  Patient Location: PACU  Anesthesia Type:General  Level of Consciousness: awake and patient cooperative  Airway & Oxygen Therapy: Patient Spontanous Breathing and Patient connected to face mask oxygen  Post-op Assessment: Report given to RN and Post -op Vital signs reviewed and stable  Post vital signs: Reviewed and stable  Last Vitals:  Vitals:   08/18/16 0924  BP: 131/82  Pulse: 81  Resp: 18  Temp: 36.7 C    Last Pain:  Vitals:   08/18/16 0924  TempSrc: Oral      Patients Stated Pain Goal: 4 ([id number])  Complications: No apparent anesthesia complications

## 2016-08-18 NOTE — Anesthesia Postprocedure Evaluation (Signed)
Anesthesia Post Note  Patient: Jennifer [name]  Procedure(s) Performed: Procedure(s) (LRB): LEFT TOTAL KNEE ARTHROPLASTY (Left)  Patient location during evaluation: PACU Anesthesia Type: General Level of consciousness: awake and sedated Pain management: pain level controlled Vital Signs Assessment: post-procedure vital signs reviewed and stable Respiratory status: spontaneous breathing Cardiovascular status: stable Postop Assessment: no signs of nausea or vomiting Anesthetic complications: no     Last Vitals:  Vitals:   08/18/16 1430 08/18/16 1445  BP: (!) 155/82 (!) 155/83  Pulse: 70 62  Resp: 16 12  Temp:      Last Pain:  Vitals:   08/18/16 1445  TempSrc:   PainSc: Asleep   Pain Goal: Patients Stated Pain Goal: 4 (08/18/16 0924)               Keino Placencia JR,JOHN Mateo Flow

## 2016-08-18 NOTE — Anesthesia Preprocedure Evaluation (Signed)
Anesthesia Evaluation  Patient identified by MRN, date of birth, ID band Patient awake    Reviewed: Allergy & Precautions, NPO status , Patient's Chart, lab work & pertinent test results  Airway Mallampati: I       Dental no notable dental hx.    Pulmonary neg pulmonary ROS,    Pulmonary exam normal        Cardiovascular hypertension, Pt. on medications Normal cardiovascular exam     Neuro/Psych negative neurological ROS  negative psych ROS   GI/Hepatic negative GI ROS, Neg liver ROS,   Endo/Other  negative endocrine ROS  Renal/GU negative Renal ROS  negative genitourinary   Musculoskeletal   Abdominal (+) + obese,   Peds negative pediatric ROS (+)  Hematology negative hematology ROS (+)   Anesthesia Other Findings   Reproductive/Obstetrics negative OB ROS                             Anesthesia Physical Anesthesia Plan  ASA: II  Anesthesia Plan: Spinal   Post-op Pain Management:    Induction:   Airway Management Planned: Natural Airway  Additional Equipment:   Intra-op Plan:   Post-operative Plan:   Informed Consent: I have reviewed the patients History and Physical, chart, labs and discussed the procedure including the risks, benefits and alternatives for the proposed anesthesia with the patient or authorized representative who has indicated his/her understanding and acceptance.     Plan Discussed with: CRNA and Surgeon  Anesthesia Plan Comments:         Anesthesia Quick Evaluation

## 2016-08-18 NOTE — Op Note (Signed)
OPERATIVE REPORT-TOTAL KNEE ARTHROPLASTY   Pre-operative diagnosis- Osteoarthritis  Left knee(s)  Post-operative diagnosis- Osteoarthritis Left knee(s)  Procedure-  Left  Total Knee Arthroplasty (Depuy Attune)  Surgeon- Jennifer Plover. Yvonnie Schinke, MD  Assistant- Arlee Muslim, PA-C   Anesthesia-  General  EBL-* No blood loss amount entered *   Drains Hemovac  Tourniquet time-  Total Tourniquet Time Documented: Thigh (Left) - 5 minutes Total: Thigh (Left) - 5 minutes     Complications- None  Condition-PACU - hemodynamically stable.   Brief Clinical Note  Jennifer [name] is a 55 y.o. year old female with end stage OA of her left knee with progressively worsening pain and dysfunction. She has constant pain, with activity and at rest and significant functional deficits with difficulties even with ADLs. She has had extensive non-op management including analgesics, injections of cortisone, and home exercise program, but remains in significant pain with significant dysfunction. Radiographs show bone on bone arthritis patellofemoral. She presents now for left Total Knee Arthroplasty.    Procedure in detail---   The patient is brought into the operating room and positioned supine on the operating table. After successful administration of  General,   a tourniquet is placed high on the  Left thigh(s) and the lower extremity is prepped and draped in the usual sterile fashion. Time out is performed by the operating team and then the  Left lower extremity is wrapped in Esmarch, knee flexed and the tourniquet inflated to 300 mmHg.       A midline incision is made with a ten blade through the subcutaneous tissue to the level of the extensor mechanism. A fresh blade is used to make a medial parapatellar arthrotomy. Soft tissue over the proximal medial tibia is subperiosteally elevated to the joint line with a knife and into the semimembranosus bursa with a Cobb elevator. Soft tissue over the proximal  lateral tibia is elevated with attention being paid to avoiding the patellar tendon on the tibial tubercle. The patella is everted, knee flexed 90 degrees and the ACL and PCL are removed. Findings are bone on bone patellofemoral with exposed bone medial.        The drill is used to create a starting hole in the distal femur and the canal is thoroughly irrigated with sterile saline to remove the fatty contents. The 5 degree Left  valgus alignment guide is placed into the femoral canal and the distal femoral cutting block is pinned to remove 10 mm off the distal femur. Resection is made with an oscillating saw. It is noted that the tourniquet is not functioning and is thus released after 5 minutes      The tibia is subluxed forward and the menisci are removed. The extramedullary alignment guide is placed referencing proximally at the medial aspect of the tibial tubercle and distally along the second metatarsal axis and tibial crest. The block is pinned to remove 38mm off the more deficient medial  side. Resection is made with an oscillating saw. Size 4is the most appropriate size for the tibia and the proximal tibia is prepared with the modular drill and keel punch for that size.      The femoral sizing guide is placed and size 5 is most appropriate. Rotation is marked off the epicondylar axis and confirmed by creating a rectangular flexion gap at 90 degrees. The size 5 cutting block is pinned in this rotation and the anterior, posterior and chamfer cuts are made with the oscillating saw. The intercondylar block  is then placed and that cut is made.      Trial size 4 tibial component, trial size 5 narrow posterior stabilized femur and a 6  mm posterior stabilized rotating platform insert trial is placed. Full extension is achieved with excellent varus/valgus and anterior/posterior balance throughout full range of motion. The patella is everted and thickness measured to be 22  mm. Free hand resection is taken to 12  mm, a 35 template is placed, lug holes are drilled, trial patella is placed, and it tracks normally. Osteophytes are removed off the posterior femur with the trial in place. All trials are removed and the cut bone surfaces prepared with pulsatile lavage. Cement is mixed and once ready for implantation, the size 4 tibial implant, size  5 narrow posterior stabilized femoral component, and the size 35 patella are cemented in place and the patella is held with the clamp. The trial insert is placed and the knee held in full extension. The Exparel (20 ml mixed with 30 ml saline) and .25% Bupivicaine, are injected into the extensor mechanism, posterior capsule, medial and lateral gutters and subcutaneous tissues.  All extruded cement is removed and once the cement is hard the permanent 6 mm posterior stabilized rotating platform insert is placed into the tibial tray.      The wound is copiously irrigated with saline solution and the extensor mechanism closed over a hemovac drain with #1 V-loc suture.Flexion against gravity is 140 degrees and the patella tracks normally. Subcutaneous tissue is closed with 2.0 vicryl and subcuticular with running 4.0 Monocryl. The incision is cleaned and dried and steri-strips and a bulky sterile dressing are applied. The limb is placed into a knee immobilizer and the patient is awakened and transported to recovery in stable condition.      Please note that a surgical assistant was a medical necessity for this procedure in order to perform it in a safe and expeditious manner. Surgical assistant was necessary to retract the ligaments and vital neurovascular structures to prevent injury to them and also necessary for proper positioning of the limb to allow for anatomic placement of the prosthesis.   Jennifer Plover Jennifer Kluesner, MD    08/18/2016, 1:42 PM

## 2016-08-18 NOTE — Anesthesia Procedure Notes (Signed)
Procedure Name: Intubation Date/Time: 08/18/2016 12:29 PM Performed by: Dione Booze Pre-anesthesia Checklist: Emergency Drugs available, Suction available, Patient being monitored and Patient identified Patient Re-evaluated:Patient Re-evaluated prior to inductionOxygen Delivery Method: Circle system utilized Preoxygenation: Pre-oxygenation with 100% oxygen Intubation Type: IV induction Laryngoscope Size: Mac and 4 Grade View: Grade I Tube type: Oral Tube size: 7.5 mm Number of attempts: 1 Airway Equipment and Method: Stylet Placement Confirmation: ETT inserted through vocal cords under direct vision,  positive ETCO2 and breath sounds checked- equal and bilateral Secured at: 21 cm Tube secured with: Tape Dental Injury: Teeth and Oropharynx as per pre-operative assessment

## 2016-08-18 NOTE — Interval H&P Note (Signed)
History and Physical Interval Note:  08/18/2016 12:01 PM  Jennifer [name]  has presented today for surgery, with the diagnosis of LEFT KNEE OA  The various methods of treatment have been discussed with the patient and family. After consideration of risks, benefits and other options for treatment, the patient has consented to  Procedure(s): LEFT TOTAL KNEE ARTHROPLASTY (Left) as a surgical intervention .  The patient's history has been reviewed, patient examined, no change in status, stable for surgery.  I have reviewed the patient's chart and labs.  Questions were answered to the patient's satisfaction.     Gearlean Alf

## 2016-08-18 NOTE — H&P (View-Only) (Signed)
Jennifer [name] DOB: 1961-03-03 Married / Language: English / Race: Black or African American Female Date of Admission:  08/18/2016 CC:  Left Knee Pain History of Present Illness The patient is a 55 year old female who comes in  for a preoperative History and Physical. The patient is scheduled for a left total knee arthroplasty to be performed by Dr. Dione Plover. Aluisio, MD at [facility] on 08/18/2016. The patient is a 55 year old female who presented with knee complaints. The patient was seen for a second opinion. The patient reports left knee symptoms including: pain, swelling, instability, giving way, weakness and stiffness which began year(s) ago without any known injury.The patient feels that the symptoms are worsening. Prior to being seen the patient was previously evaluated by [facility]. Past treatment for this problem has included intra-articular injection of corticosteroids. Symptoms are exacerbated by weight bearing, walking, kneeling and squatting. Current treatment includes knee brace (PRN, Tramadol and Gabapentin daily). Currently both knees bother her, but the left is more symptomatic than the right. Left knee is hurting with most all activities. Even hurt some at rest. She does get some night pain. The left knee is limiting what she can and cannot do. She feels as though she would like to be more active, but the left knee is preventing her from doing so. She has had cortisone and viscous supplement injections in the past. She has been treated at [facility]. She has also had her right knee replaced there few years ago. Unfortunately, she is having problems with her right knee giving out. She also has pain associated with that. Did not have any problems initially after the surgery. This developed later on. She said the right is problematic, but the left is currently giving her more trouble. She is ready to get the left knee replaced. They have been treated  conservatively in the past for the above stated problem and despite conservative measures, they continue to have progressive pain and severe functional limitations and dysfunction. They have failed non-operative management including home exercise, medications, and injections. It is felt that they would benefit from undergoing total joint replacement. Risks and benefits of the procedure have been discussed with the patient and they elect to proceed with surgery. There are no active contraindications to surgery such as ongoing infection or rapidly progressive neurological disease.   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)  Chronic Pain  High blood pressure  Osteoarthritis   Allergies  Methocarbamol *CHEMICALS*  LODINE [10/28/2002]: SWELLING IN LIP OxyCODONE HCl *ANALGESICS - OPIOID*  Sick - Patient IS ABLE to take Hydrocodone Darvocet-N 100 *ANALGESICS - OPIOID*   Family History  Cancer  mother and father Diabetes Mellitus  grandfather mothers side and brother Hypertension  father  Social History Children  3 Current drinker  01/31/2016: Currently drinks beer only occasionally per week Current work status  disabled Exercise  Exercises weekly; does running / walking Living situation  live alone Marital status  widowed No history of drug/alcohol rehab  Not under pain contract  Number of flights of stairs before winded  4-5 Tobacco use  01/31/2016: never smoker  Medication History Gabapentin (300MG  Capsule, Oral) Active. TraMADol HCl (50MG  Tablet, Oral) Active. Estradiol (0.5MG  Tablet, Oral) Active. HydroCHLOROthiazide (25MG  Tablet, Oral) Active. Cholestyramine (4GM/DOSE Powder, Oral) Active. (PRN)   Past Surgical History Arthroscopy of Knee  right Gallbladder Surgery  Date: 2006. open Hysterectomy  complete (non-cancerous) Neck Disc Surgery  Date:  2003. Spinal Fusion  lower back Spinal Surgery  Total Knee Replacement  Date: 2012. right  Review of Systems General Not Present- Chills, Fatigue, Fever, Memory Loss, Night Sweats, Weight Gain and Weight Loss. Skin Not Present- Eczema, Hives, Itching, Lesions and Rash. HEENT Not Present- Dentures, Double Vision, Headache, Hearing Loss, Tinnitus and Visual Loss. Respiratory Not Present- Allergies, Chronic Cough, Coughing up blood, Shortness of breath at rest and Shortness of breath with exertion. Cardiovascular Not Present- Chest Pain, Difficulty Breathing Lying Down, Murmur, Palpitations, Racing/skipping heartbeats and Swelling. Gastrointestinal Not Present- Abdominal Pain, Bloody Stool, Constipation, Diarrhea, Difficulty Swallowing, Heartburn, Jaundice, Loss of appetitie, Nausea and Vomiting. Female Genitourinary Not Present- Blood in Urine, Discharge, Flank Pain, Incontinence, Painful Urination, Urgency, Urinary frequency, Urinary Retention, Urinating at Night and Weak urinary stream. Musculoskeletal Present- Back Pain, Joint Pain, Joint Swelling, Morning Stiffness and Muscle Weakness. Not Present- Muscle Pain and Spasms. Neurological Not Present- Blackout spells, Difficulty with balance, Dizziness, Paralysis, Tremor and Weakness. Psychiatric Not Present- Insomnia.  Vitals  Weight: 205 lb Height: 65in Body Surface Area: 2 m Body Mass Index: 34.11 kg/m  Pulse: 64 (Regular)  BP: 124/82 (Sitting, Right Arm, Standard)   Physical Exam  General Mental Status -Alert, cooperative and good historian. General Appearance-pleasant, Not in acute distress. Orientation-Oriented X3. Build & Nutrition-Well nourished and Well developed.  Head and Neck Head-normocephalic, atraumatic . Neck Global Assessment - supple, no bruit auscultated on the right, no bruit auscultated on the left.  Eye Pupil - Bilateral-Regular and Round. Motion - Bilateral-EOMI.  Chest and  Lung Exam Auscultation Breath sounds - clear at anterior chest wall and clear at posterior chest wall. Adventitious sounds - No Adventitious sounds.  Cardiovascular Auscultation Rhythm - Regular rate and rhythm. Heart Sounds - S1 WNL and S2 WNL. Murmurs & Other Heart Sounds - Auscultation of the heart reveals - No Murmurs.  Abdomen Palpation/Percussion Tenderness - Abdomen is non-tender to palpation. Rigidity (guarding) - Abdomen is soft. Auscultation Auscultation of the abdomen reveals - Bowel sounds normal.  Female Genitourinary Note: Not done, not pertinent to present illness   Musculoskeletal Note: On exam, well-developed female, alert and oriented, in no apparent distress. Evaluation of her left hip shows normal range of motion with no discomfort. Her left knee shows no effusion. Range is about 5 to 125 with moderate crepitus on range of motion, tenderness medial greater than lateral with no instability noted. Right knee, well-healed anterior incision. She hyperextends about 5 degrees, has a fair amount of varus and valgus laxity in extension. She can flex down about 100 and has a fair amount of AP laxity in flexion.  RADIOGRAPHS AP and lateral of both knees show the prosthesis on the right appears to be well aligned with no definitive periprosthetic abnormalities. On the left, she has got bone-on-bone arthritis, medial and patellofemoral compartments.   Assessment & Plan Primary osteoarthritis of left knee (M17.12)  Note:Surgical Plans: Left Total Knee Replacement  Disposition: Home  PCP: Dr. Lorine Bears  IV TXA  Signed electronically by Ok Edwards, III PA-C

## 2016-08-19 LAB — BASIC METABOLIC PANEL
ANION GAP: 8 (ref 5–15)
BUN: 13 mg/dL (ref 6–20)
CO2: 24 mmol/L (ref 22–32)
Calcium: 8.1 mg/dL — ABNORMAL LOW (ref 8.9–10.3)
Chloride: 105 mmol/L (ref 101–111)
Creatinine, Ser: 1.06 mg/dL — ABNORMAL HIGH (ref 0.44–1.00)
GFR, EST NON AFRICAN AMERICAN: 58 mL/min — AB (ref >60)
Glucose, Bld: 143 mg/dL — ABNORMAL HIGH (ref 65–99)
POTASSIUM: 4.3 mmol/L (ref 3.5–5.1)
SODIUM: 137 mmol/L (ref 135–145)

## 2016-08-19 LAB — CBC
HEMATOCRIT: 33 % — AB (ref 36.0–46.0)
HEMOGLOBIN: 11.1 g/dL — AB (ref 12.0–15.0)
MCH: 27.6 pg (ref 26.0–34.0)
MCHC: 33.6 g/dL (ref 30.0–36.0)
MCV: 82.1 fL (ref 78.0–100.0)
Platelets: 304 10*3/uL (ref 150–400)
RBC: 4.02 MIL/uL (ref 3.87–5.11)
RDW: 14.7 % (ref 11.5–15.5)
WBC: 19.9 10*3/uL — AB (ref 4.0–10.5)

## 2016-08-19 MED ORDER — CYCLOBENZAPRINE HCL 10 MG PO TABS: 10.0000 mg | ORAL_TABLET | Freq: Three times a day (TID) | ORAL | 0 refills | Status: DC | PRN

## 2016-08-19 MED ORDER — SODIUM CHLORIDE 0.9 % IV BOLUS (SEPSIS)
250.0000 mL | Freq: Once | INTRAVENOUS | Status: AC
  Administered 2016-08-19: 250 mL via INTRAVENOUS

## 2016-08-19 MED ORDER — OXYCODONE HCL 5 MG PO TABS: 5.0000 mg | ORAL_TABLET | ORAL | 0 refills | Status: DC | PRN

## 2016-08-19 MED ORDER — TRAMADOL HCL 50 MG PO TABS: 50.0000 mg | ORAL_TABLET | Freq: Four times a day (QID) | ORAL | 0 refills | Status: DC | PRN

## 2016-08-19 MED ORDER — CYCLOBENZAPRINE HCL 10 MG PO TABS
10.0000 mg | ORAL_TABLET | Freq: Three times a day (TID) | ORAL | Status: DC | PRN
  Administered 2016-08-19: 10 mg via ORAL
  Filled 2016-08-19: qty 1

## 2016-08-19 MED ORDER — RIVAROXABAN 10 MG PO TABS: 10.0000 mg | ORAL_TABLET | Freq: Every day | ORAL | 0 refills | Status: DC

## 2016-08-19 NOTE — Progress Notes (Signed)
Physical Therapy Treatment Patient Details Name: Jennifer [name] MRN: [id number] DOB: [birth year] Today's Date: 08/19/2016    History of Present Illness 55 yo female s/p L TKA 08/18/16. Hx of R TKA    PT Comments    Progressing slowly with mobility. Moderate pain with activity. Will continue to follow and progress activity as tolerated.   Follow Up Recommendations  Home health PT;Supervision - Intermittent     Equipment Recommendations  None recommended by PT    Recommendations for Other Services       Precautions / Restrictions Precautions Precautions: Fall;Knee Required Braces or Orthoses: Knee Immobilizer - Left Knee Immobilizer - Left: Discontinue once straight leg raise with < 10 degree lag Restrictions Weight Bearing Restrictions: No LLE Weight Bearing: Weight bearing as tolerated    Mobility  Bed Mobility Overal bed mobility: Needs Assistance Bed Mobility: Supine to Sit;Sit to Supine     Supine to sit: Min assist;HOB elevated Sit to supine: Min assist;HOB elevated   General bed mobility comments: Assist for L LE. Increased time.   Transfers Overall transfer level: Needs assistance Equipment used: Rolling walker (2 wheeled) Transfers: Sit to/from Stand Sit to Stand: From elevated surface;Mod assist Stand pivot transfers: Min assist       General transfer comment: assist to rise and stabilize.  Cues for UE/LE placement  Ambulation/Gait Ambulation/Gait assistance: Min assist Ambulation Distance (Feet): 50 Feet Assistive device: Rolling walker (2 wheeled) Gait Pattern/deviations: Step-to pattern;Antalgic;Decreased stance time - left     General Gait Details: Assist to stabilize pt intermittently. Pt fatigues easily. Mild lightheadedness however pt stated it was much better than this am   Stairs            Wheelchair Mobility    Modified Rankin (Stroke Patients Only)       Balance                                     Cognition Arousal/Alertness: Awake/alert Behavior During Therapy: WFL for tasks assessed/performed Overall Cognitive Status: Within Functional Limits for tasks assessed                      Exercises      General Comments        Pertinent Vitals/Pain Pain Assessment: 0-10 Pain Score: 8  Pain Location: L knee with activity Pain Descriptors / Indicators: Aching Pain Intervention(s): Limited activity within patient's tolerance;Repositioned;Ice applied    Home Living Family/patient expects to be discharged to:: Private residence Living Arrangements: Alone Available Help at Discharge: Family         Home Equipment: Gilford Rile - 2 wheels;Cane - single point      Prior Function Level of Independence: Independent with assistive device(s)      Comments: using cane for ambulation prior to surgery   PT Goals (current goals can now be found in the care plan section) Acute Rehab PT Goals Patient Stated Goal: less pain Progress towards PT goals: Progressing toward goals (slowly)    Frequency    7X/week      PT Plan Current plan remains appropriate    Co-evaluation             End of Session Equipment Utilized During Treatment: Gait belt;Left knee immobilizer Activity Tolerance: Patient limited by fatigue;Patient limited by pain Patient left: in bed;with call bell/phone within reach;with family/visitor present     Time: [id number]  PT Time Calculation (min) (ACUTE ONLY): 19 min  Charges:  $Gait Training: 8-22 mins                    G Codes:      Weston Anna, MPT Pager: [phone]

## 2016-08-19 NOTE — Evaluation (Signed)
Occupational Therapy Evaluation Patient Details Name: Jennifer [name] MRN: [id number] DOB: 04-13-1961 Today's Date: 08/19/2016    History of Present Illness 55 yo female s/p L TKA 08/18/16. Hx of R TKA   Clinical Impression   Pt was admitted for the above sx. She will benefit from continued OT to increase safety and independence with adls.  Pt currently needs mod A for sit to stand during adls and min/mod A for SPT.  Goals in acute setting are for min A.      Follow Up Recommendations  Supervision/Assistance - 24 hour    Equipment Recommendations  3 in 1 bedside comode    Recommendations for Other Services       Precautions / Restrictions Precautions Precautions: Fall;Knee Required Braces or Orthoses: Knee Immobilizer - Left Knee Immobilizer - Left: Discontinue once straight leg raise with < 10 degree lag Restrictions LLE Weight Bearing: Weight bearing as tolerated      Mobility Bed Mobility         Supine to sit: Min assist;HOB elevated Sit to supine: Mod assist;HOB elevated   General bed mobility comments: assist for LLE out of bed; and for bil LEs back to bed due to pain  Transfers       Sit to Stand: Mod assist;From elevated surface Stand pivot transfers: Min assist       General transfer comment: assist to rise and stabilize.  Cues for UE/LE placement    Balance                                            ADL Overall ADL's : Needs assistance/impaired             Lower Body Bathing: Moderate assistance;Sit to/from stand       Lower Body Dressing: Maximal assistance;Sit to/from stand   Toilet Transfer: Minimal assistance;Moderate assistance;Stand-pivot;BSC;RW (mod A for sit to stand)   Toileting- Water quality scientist and Hygiene: Moderate assistance;Sit to/from stand         General ADL Comments: pt is able to perform UB adls with set up. She is unable to lift LLE at this time for adls.  Educated on AE, but pt's  daughter can also assist her; it would be difficult for her to use AE other than long sponge due to not being able to lift LLE.  Educated on sponge bathing vs tub bench. She feels she will sponge bathe initially.  Pt has difficulty advancing LLE during transfers--tends to move foot in heel/toe alternating movement     Vision     Perception     Praxis      Pertinent Vitals/Pain Pain Score: 8  Pain Location: L knee with movement Pain Descriptors / Indicators: Aching Pain Intervention(s): Limited activity within patient's tolerance;Monitored during session;Premedicated before session;Repositioned;Ice applied     Hand Dominance     Extremity/Trunk Assessment Upper Extremity Assessment Upper Extremity Assessment: Overall WFL for tasks assessed           Communication Communication Communication: No difficulties   Cognition Arousal/Alertness: Awake/alert Behavior During Therapy: WFL for tasks assessed/performed Overall Cognitive Status: Within Functional Limits for tasks assessed                     General Comments       Exercises       Shoulder Instructions  Home Living Family/patient expects to be discharged to:: Private residence Living Arrangements: Alone Available Help at Discharge: Family               Bathroom Shower/Tub: Tub/shower unit Shower/tub characteristics: Curtain Biochemist, clinical: Standard     Home Equipment: Environmental consultant - 2 wheels;Cane - single point          Prior Functioning/Environment Level of Independence: Independent with assistive device(s)        Comments: using cane for ambulation prior to surgery        OT Problem List: Decreased strength;Decreased activity tolerance;Decreased knowledge of use of DME or AE;Pain   OT Treatment/Interventions: Self-care/ADL training;DME and/or AE instruction;Patient/family education    OT Goals(Current goals can be found in the care plan section) Acute Rehab OT Goals Patient  Stated Goal: less pain OT Goal Formulation: With patient Time For Goal Achievement: 08/26/16 Potential to Achieve Goals: Good ADL Goals Pt Will Perform Grooming: with min guard assist;standing Pt Will Transfer to Toilet: with min assist;ambulating;bedside commode Pt Will Perform Toileting - Clothing Manipulation and hygiene: with min assist;sit to/from stand  OT Frequency: Min 2X/week   Barriers to D/C:            Co-evaluation              End of Session    Activity Tolerance: Patient limited by pain Patient left: in bed;with call bell/phone within reach   Time: 1323-1345 OT Time Calculation (min): 22 min Charges:  OT General Charges $OT Visit: 1 Procedure OT Evaluation $OT Eval Low Complexity: 1 Procedure G-Codes:    Vikrant Pryce 09/05/2016, 2:34 PM Lesle Chris, OTR/L [phone] 09-05-16

## 2016-08-19 NOTE — Discharge Summary (Signed)
Physician Discharge Summary   Patient ID: Jennifer [name] MRN: [id number] DOB/AGE: Jul 08, 1961 55 y.o.  Admit date: 08/18/2016 Discharge date: 08-20-2016  Primary Diagnosis:  Osteoarthritis  Left knee(s)  Admission Diagnoses:  Past Medical History:  Diagnosis Date  . Arthritis    arthritis back and knees  . Blood transfusion 05/2011  . Hypertension   . Transfusion history    s/p 2012 RTKA [facility])   Discharge Diagnoses:   Principal Problem:   OA (osteoarthritis) of knee  Estimated body mass index is 35.28 kg/m as calculated from the following:   Height as of this encounter: '5\' 5"'$  (1.651 m).   Weight as of this encounter: 96.2 kg (212 lb).  Procedure:  Procedure(s) (LRB): LEFT TOTAL KNEE ARTHROPLASTY (Left)   Consults: None  HPI: Jennifer [name] is a 55 y.o. year old female with end stage OA of her left knee with progressively worsening pain and dysfunction. She has constant pain, with activity and at rest and significant functional deficits with difficulties even with ADLs. She has had extensive non-op management including analgesics, injections of cortisone, and home exercise program, but remains in significant pain with significant dysfunction. Radiographs show bone on bone arthritis patellofemoral. She presents now for left Total Knee Arthroplasty.    Laboratory Data: Admission on 08/18/2016  Component Date Value Ref Range Status  . Sodium 08/19/2016 137  135 - 145 mmol/L Final  . Potassium 08/19/2016 4.3  3.5 - 5.1 mmol/L Final  . Chloride 08/19/2016 105  101 - 111 mmol/L Final  . CO2 08/19/2016 24  22 - 32 mmol/L Final  . Glucose, Bld 08/19/2016 143* 65 - 99 mg/dL Final  . BUN 08/19/2016 13  6 - 20 mg/dL Final  . Creatinine, Ser 08/19/2016 1.06* 0.44 - 1.00 mg/dL Final  . Calcium 08/19/2016 8.1* 8.9 - 10.3 mg/dL Final  . GFR calc non Af Amer 08/19/2016 58* >60 mL/min Final  . GFR calc Af Amer 08/19/2016 >60  >60 mL/min Final   Comment: (NOTE) The eGFR has been  calculated using the CKD EPI equation. This calculation has not been validated in all clinical situations. eGFR's persistently <60 mL/min signify possible Chronic Kidney Disease.   . Anion gap 08/19/2016 8  5 - 15 Final  . WBC 08/19/2016 19.9* 4.0 - 10.5 K/uL Final  . RBC 08/19/2016 4.02  3.87 - 5.11 MIL/uL Final  . Hemoglobin 08/19/2016 11.1* 12.0 - 15.0 g/dL Final  . HCT 08/19/2016 33.0* 36.0 - 46.0 % Final  . MCV 08/19/2016 82.1  78.0 - 100.0 fL Final  . MCH 08/19/2016 27.6  26.0 - 34.0 pg Final  . MCHC 08/19/2016 33.6  30.0 - 36.0 g/dL Final  . RDW 08/19/2016 14.7  11.5 - 15.5 % Final  . Platelets 08/19/2016 304  150 - 400 K/uL Final  Hospital Outpatient Visit on 08/08/2016  Component Date Value Ref Range Status  . MRSA, PCR 08/08/2016 NEGATIVE  NEGATIVE Final  . Staphylococcus aureus 08/08/2016 NEGATIVE  NEGATIVE Final   Comment:        The Xpert SA Assay (FDA approved for NASAL specimens in patients over 10 years of age), is one component of a comprehensive surveillance program.  Test performance has been validated by [facility] for patients greater than or equal to 66 year old. It is not intended to diagnose infection nor to guide or monitor treatment.   Marland Kitchen aPTT 08/08/2016 39* 24 - 36 seconds Final   Comment:  IF BASELINE aPTT IS ELEVATED, SUGGEST PATIENT RISK ASSESSMENT BE USED TO DETERMINE APPROPRIATE ANTICOAGULANT THERAPY.   . WBC 08/08/2016 8.7  4.0 - 10.5 K/uL Final  . RBC 08/08/2016 5.26* 3.87 - 5.11 MIL/uL Final  . Hemoglobin 08/08/2016 14.4  12.0 - 15.0 g/dL Final  . HCT 08/08/2016 42.4  36.0 - 46.0 % Final  . MCV 08/08/2016 80.6  78.0 - 100.0 fL Final  . MCH 08/08/2016 27.4  26.0 - 34.0 pg Final  . MCHC 08/08/2016 34.0  30.0 - 36.0 g/dL Final  . RDW 08/08/2016 14.8  11.5 - 15.5 % Final  . Platelets 08/08/2016 356  150 - 400 K/uL Final  . Sodium 08/08/2016 141  135 - 145 mmol/L Final  . Potassium 08/08/2016 4.2  3.5 - 5.1 mmol/L Final  . Chloride  08/08/2016 104  101 - 111 mmol/L Final  . CO2 08/08/2016 31  22 - 32 mmol/L Final  . Glucose, Bld 08/08/2016 87  65 - 99 mg/dL Final  . BUN 08/08/2016 12  6 - 20 mg/dL Final  . Creatinine, Ser 08/08/2016 0.97  0.44 - 1.00 mg/dL Final  . Calcium 08/08/2016 9.4  8.9 - 10.3 mg/dL Final  . Total Protein 08/08/2016 7.5  6.5 - 8.1 g/dL Final  . Albumin 08/08/2016 4.1  3.5 - 5.0 g/dL Final  . AST 08/08/2016 26  15 - 41 U/L Final  . ALT 08/08/2016 22  14 - 54 U/L Final  . Alkaline Phosphatase 08/08/2016 106  38 - 126 U/L Final  . Total Bilirubin 08/08/2016 0.4  0.3 - 1.2 mg/dL Final  . GFR calc non Af Amer 08/08/2016 >60  >60 mL/min Final  . GFR calc Af Amer 08/08/2016 >60  >60 mL/min Final   Comment: (NOTE) The eGFR has been calculated using the CKD EPI equation. This calculation has not been validated in all clinical situations. eGFR's persistently <60 mL/min signify possible Chronic Kidney Disease.   . Anion gap 08/08/2016 6  5 - 15 Final  . Prothrombin Time 08/08/2016 13.2  11.4 - 15.2 seconds Final  . INR 08/08/2016 1.00   Final  . ABO/RH(D) 08/18/2016 O POS   Final  . Antibody Screen 08/18/2016 NEG   Final  . Sample Expiration 08/18/2016 08/21/2016   Final  . Extend sample reason 08/18/2016 NO TRANSFUSIONS OR PREGNANCY IN THE PAST 3 MONTHS   Final  . Color, Urine 08/08/2016 YELLOW  YELLOW Final  . APPearance 08/08/2016 CLEAR  CLEAR Final  . Specific Gravity, Urine 08/08/2016 1.012  1.005 - 1.030 Final  . pH 08/08/2016 5.5  5.0 - 8.0 Final  . Glucose, UA 08/08/2016 NEGATIVE  NEGATIVE mg/dL Final  . Hgb urine dipstick 08/08/2016 NEGATIVE  NEGATIVE Final  . Bilirubin Urine 08/08/2016 NEGATIVE  NEGATIVE Final  . Ketones, ur 08/08/2016 NEGATIVE  NEGATIVE mg/dL Final  . Protein, ur 08/08/2016 NEGATIVE  NEGATIVE mg/dL Final  . Nitrite 08/08/2016 NEGATIVE  NEGATIVE Final  . Leukocytes, UA 08/08/2016 NEGATIVE  NEGATIVE Final     X-Rays:No results found.  EKG: Orders placed or  performed during the hospital encounter of 08/08/16  . EKG 12 lead  . EKG 12 lead     Hospital Course: Jennifer [name] is a 55 y.o. who was admitted to [facility]. They were brought to the operating room on 08/18/2016 and underwent Procedure(s): LEFT TOTAL KNEE ARTHROPLASTY.  Patient tolerated the procedure well and was later transferred to the recovery room and then to the orthopaedic  floor for postoperative care.  They were given PO and IV analgesics for pain control following their surgery.  They were given 24 hours of postoperative antibiotics of  Anti-infectives    Start     Dose/Rate Route Frequency Ordered Stop   08/18/16 1830  ceFAZolin (ANCEF) IVPB 2g/100 mL premix     2 g 200 mL/hr over 30 Minutes Intravenous Every 6 hours 08/18/16 1512 08/19/16 0054   08/18/16 0921  ceFAZolin (ANCEF) IVPB 2g/100 mL premix     2 g 200 mL/hr over 30 Minutes Intravenous On call to O.R. 08/18/16 8934 08/18/16 1218     and started on DVT prophylaxis in the form of Xarelto.   PT and OT were ordered for total joint protocol.  Discharge planning consulted to help with postop disposition and equipment needs.  Patient had a decent night on the evening of surgery.  They started to get up OOB with therapy on day one. Hemovac drain was pulled without difficulty.  Continued to work with therapy into day two.  Dressing was changed on day two and the incision was healing well.   Patient was seen in rounds and was ready to go home on day two.  Discharge home with home health Diet - Cardiac diet Follow up - in 2 weeks Activity - WBAT Disposition - Home Condition Upon Discharge - Good D/C Meds - See DC Summary DVT Prophylaxis - Xarelto   Discharge Instructions    Call MD / Call 911    Complete by:  As directed    If you experience chest pain or shortness of breath, CALL 911 and be transported to the hospital emergency room.  If you develope a fever above 101 F, pus (white drainage) or increased  drainage or redness at the wound, or calf pain, call your surgeon's office.   Change dressing    Complete by:  As directed    Change dressing daily with sterile 4 x 4 inch gauze dressing and apply TED hose. Do not submerge the incision under water.   Constipation Prevention    Complete by:  As directed    Drink plenty of fluids.  Prune juice may be helpful.  You may use a stool softener, such as Colace (over the counter) 100 mg twice a day.  Use MiraLax (over the counter) for constipation as needed.   Diet general    Complete by:  As directed    Discharge instructions    Complete by:  As directed    Pick up stool softner and laxative for home use following surgery while on pain medications. Do not submerge incision under water. Please use good hand washing techniques while changing dressing each day. May shower starting three days after surgery. Please use a clean towel to pat the incision dry following showers. Continue to use ice for pain and swelling after surgery. Do not use any lotions or creams on the incision until instructed by your surgeon.   Postoperative Constipation Protocol  Constipation - defined medically as fewer than three stools per week and severe constipation as less than one stool per week.  One of the most common issues patients have following surgery is constipation.  Even if you have a regular bowel pattern at home, your normal regimen is likely to be disrupted due to multiple reasons following surgery.  Combination of anesthesia, postoperative narcotics, change in appetite and fluid intake all can affect your bowels.  In order to avoid complications following surgery, here  are some recommendations in order to help you during your recovery period.  Colace (docusate) - Pick up an over-the-counter form of Colace or another stool softener and take twice a day as long as you are requiring postoperative pain medications.  Take with a full glass of water daily.  If you  experience loose stools or diarrhea, hold the colace until you stool forms back up.  If your symptoms do not get better within 1 week or if they get worse, check with your doctor.  Dulcolax (bisacodyl) - Pick up over-the-counter and take as directed by the product packaging as needed to assist with the movement of your bowels.  Take with a full glass of water.  Use this product as needed if not relieved by Colace only.   MiraLax (polyethylene glycol) - Pick up over-the-counter to have on hand.  MiraLax is a solution that will increase the amount of water in your bowels to assist with bowel movements.  Take as directed and can mix with a glass of water, juice, soda, coffee, or tea.  Take if you go more than two days without a movement. Do not use MiraLax more than once per day. Call your doctor if you are still constipated or irregular after using this medication for 7 days in a row.  If you continue to have problems with postoperative constipation, please contact the office for further assistance and recommendations.  If you experience "the worst abdominal pain ever" or develop nausea or vomiting, please contact the office immediatly for further recommendations for treatment.   Take Xarelto for two and a half more weeks, then discontinue Xarelto. Once the patient has completed the blood thinner regimen, then take a Baby 81 mg Aspirin daily for three more weeks.   Do not put a pillow under the knee. Place it under the heel.    Complete by:  As directed    Do not sit on low chairs, stoools or toilet seats, as it may be difficult to get up from low surfaces    Complete by:  As directed    Driving restrictions    Complete by:  As directed    No driving until released by the physician.   Increase activity slowly as tolerated    Complete by:  As directed    Lifting restrictions    Complete by:  As directed    No lifting until released by the physician.   Patient may shower    Complete by:  As  directed    You may shower without a dressing once there is no drainage.  Do not wash over the wound.  If drainage remains, do not shower until drainage stops.   TED hose    Complete by:  As directed    Use stockings (TED hose) for 3 weeks on both leg(s).  You may remove them at night for sleeping.   Weight bearing as tolerated    Complete by:  As directed        Medication List    STOP taking these medications   estradiol 0.5 MG tablet Commonly known as:  ESTRACE   HYDROcodone-homatropine 5-1.5 MG/5ML syrup Commonly known as:  HYCODAN   levofloxacin 500 MG tablet Commonly known as:  LEVAQUIN   multivitamin with minerals Tabs tablet   VITAMIN C PO   ZINC PO     TAKE these medications   albuterol (2.5 MG/3ML) 0.083% nebulizer solution Commonly known as:  PROVENTIL Take 3 mLs (2.5  mg total) by nebulization every 6 (six) hours as needed for wheezing or shortness of breath.   cholestyramine 4 g packet Commonly known as:  QUESTRAN Take 4 g by mouth 3 (three) times daily with meals.   cholestyramine 4 GM/DOSE powder Commonly known as:  QUESTRAN Take 4 g by mouth daily as needed (stomach pain).   cyclobenzaprine 10 MG tablet Commonly known as:  FLEXERIL Take 1 tablet (10 mg total) by mouth 3 (three) times daily as needed for muscle spasms.   gabapentin 300 MG capsule Commonly known as:  NEURONTIN Take 300 mg by mouth at bedtime.   hydrochlorothiazide 25 MG tablet Commonly known as:  HYDRODIURIL Take 25 mg by mouth daily.   ipratropium 0.03 % nasal spray Commonly known as:  ATROVENT Place 2 sprays into both nostrils 2 (two) times daily.   oxyCODONE 5 MG immediate release tablet Commonly known as:  Oxy IR/ROXICODONE Take 1-2 tablets (5-10 mg total) by mouth every 3 (three) hours as needed for moderate pain or severe pain.   rivaroxaban 10 MG Tabs tablet Commonly known as:  XARELTO Take 1 tablet (10 mg total) by mouth daily with breakfast. Take Xarelto for two  and a half more weeks, then discontinue Xarelto. Once the patient has completed the blood thinner regimen, then take a Baby 81 mg Aspirin daily for three more weeks. Start taking on:  08/20/2016   traMADol 50 MG tablet Commonly known as:  ULTRAM Take 1-2 tablets (50-100 mg total) by mouth every 6 (six) hours as needed (mild pain). What changed:  how much to take  how to take this  when to take this  reasons to take this  additional instructions      Follow-up Information    Gearlean Alf, MD. Schedule an appointment as soon as possible for a visit on 09/02/2016.   Specialty:  Orthopedic Surgery Contact information: [address] [place] [address] [phone]        Sharyne Richters, CRNA .   Contact information: [place] [address] [phone]           Signed: Arlee Muslim, PA-C Orthopaedic Surgery 08/19/2016, 10:05 PM

## 2016-08-19 NOTE — Evaluation (Signed)
Physical Therapy Evaluation Patient Details Name: Jennifer [name] MRN: [id number] DOB: Aug 12, 1961 Today's Date: 08/19/2016   History of Present Illness  55 yo female s/p L TKA 08/18/16. Hx of R TKA  Clinical Impression  On eval, pt required Mod assist-Min assist +2 for safety for mobility. She walked ~30 feet with a RW. Ambulation distance limited by fatigue, some lightheadedness. Pt currently getting a bolus. Will follow and progress activity as tolerated.     Follow Up Recommendations Home health PT;Supervision - Intermittent    Equipment Recommendations  None recommended by PT    Recommendations for Other Services OT consult     Precautions / Restrictions Precautions Precautions: Fall;Knee Required Braces or Orthoses: Knee Immobilizer - Left Knee Immobilizer - Left: Discontinue once straight leg raise with < 10 degree lag Restrictions Weight Bearing Restrictions: No LLE Weight Bearing: Weight bearing as tolerated      Mobility  Bed Mobility Overal bed mobility: Needs Assistance Bed Mobility: Supine to Sit     Supine to sit: Min assist;HOB elevated     General bed mobility comments: Assist for L LE. Increased time.   Transfers Overall transfer level: Needs assistance Equipment used: Rolling walker (2 wheeled) Transfers: Sit to/from [facility] Sit to Stand: Mod assist;From elevated surface Stand pivot transfers: Min assist       General transfer comment: Assist to rise, stabilize, control descent. VCs safety, technique, hand/LE placement. Stand pivot, bed>bsc, with RW  Ambulation/Gait Ambulation/Gait assistance: Min assist;+2 safety/equipment Ambulation Distance (Feet): 30 Feet Assistive device: Rolling walker (2 wheeled) Gait Pattern/deviations: Step-to pattern;Trunk flexed;Antalgic     General Gait Details: Assist to stabilize pt. Followed with recliner for safety. Pt fatigues easily.   Stairs            Wheelchair Mobility     Modified Rankin (Stroke Patients Only)       Balance                                             Pertinent Vitals/Pain Pain Assessment: 0-10 Pain Score: 7  Pain Location: L knee with activity Pain Descriptors / Indicators: Aching;Sore;Grimacing Pain Intervention(s): Monitored during session;Repositioned;Ice applied    Home Living Family/patient expects to be discharged to:: Private residence Living Arrangements: Alone Available Help at Discharge: Family (discharging to daughter's home) Type of Home: Apartment Home Access: Stairs to enter Entrance Stairs-Rails: Right Entrance Stairs-Number of Steps: 1 flight Home Layout: One level Home Equipment: Environmental consultant - 2 wheels;Cane - single point      Prior Function Level of Independence: Independent with assistive device(s)         Comments: using cane for ambulation prior to surgery     Hand Dominance        Extremity/Trunk Assessment   Upper Extremity Assessment: Defer to OT evaluation           Lower Extremity Assessment: Generalized weakness      Cervical / Trunk Assessment: Normal  Communication   Communication: No difficulties  Cognition Arousal/Alertness: Awake/alert Behavior During Therapy: WFL for tasks assessed/performed Overall Cognitive Status: Within Functional Limits for tasks assessed                      General Comments      Exercises Total Joint Exercises Ankle Circles/Pumps: AROM;Both;10 reps;Supine Quad Sets: AROM;Both;10 reps;Supine Heel Slides: AAROM;Left;10  reps;Supine Hip ABduction/ADduction: AAROM;Left;10 reps;Supine Straight Leg Raises: AAROM;Left;10 reps;Supine Goniometric ROM: ~10-60 degrees   Assessment/Plan    PT Assessment Patient needs continued PT services  PT Problem List Decreased strength;Decreased mobility;Decreased range of motion;Decreased activity tolerance;Decreased balance;Decreased knowledge of use of DME;Pain;Decreased knowledge  of precautions          PT Treatment Interventions DME instruction;Therapeutic activities;Gait training;Therapeutic exercise;Patient/family education;Stair training;Balance training;Functional mobility training    PT Goals (Current goals can be found in the Care Plan section)  Acute Rehab PT Goals Patient Stated Goal: less pain PT Goal Formulation: With patient Time For Goal Achievement: 08/26/16 Potential to Achieve Goals: Good    Frequency 7X/week   Barriers to discharge        Co-evaluation               End of Session Equipment Utilized During Treatment: Gait belt;Left knee immobilizer Activity Tolerance: Patient limited by fatigue;Patient limited by pain Patient left: in chair;with call bell/phone within reach           Time: 1005-1031 PT Time Calculation (min) (ACUTE ONLY): 26 min   Charges:   PT Evaluation $PT Eval Low Complexity: 1 Procedure PT Treatments $Gait Training: 8-22 mins   PT G Codes:        Weston Anna, MPT Pager: [phone]

## 2016-08-19 NOTE — Discharge Instructions (Addendum)
° °Dr. Frank Aluisio °Total Joint Specialist °[facility] °[address] °[place], [place] [address] °[phone] ° °TOTAL KNEE REPLACEMENT POSTOPERATIVE DIRECTIONS ° °Knee Rehabilitation, Guidelines Following Surgery  °Results after knee surgery are often greatly improved when you follow the exercise, range of motion and muscle strengthening exercises prescribed by your doctor. Safety measures are also important to protect the knee from further injury. Any time any of these exercises cause you to have increased pain or swelling in your knee joint, decrease the amount until you are comfortable again and slowly increase them. If you have problems or questions, call your caregiver or physical therapist for advice.  ° °HOME CARE INSTRUCTIONS  °Remove items at home which could result in a fall. This includes throw rugs or furniture in walking pathways.  °· ICE to the affected knee every three hours for 30 minutes at a time and then as needed for pain and swelling.  Continue to use ice on the knee for pain and swelling from surgery. You may notice swelling that will progress down to the foot and ankle.  This is normal after surgery.  Elevate the leg when you are not up walking on it.   °· Continue to use the breathing machine which will help keep your temperature down.  It is common for your temperature to cycle up and down following surgery, especially at night when you are not up moving around and exerting yourself.  The breathing machine keeps your lungs expanded and your temperature down. °· Do not place pillow under knee, focus on keeping the knee straight while resting ° °DIET °You may resume your previous home diet once your are discharged from the hospital. ° °DRESSING / WOUND CARE / SHOWERING °You may shower 3 days after surgery, but keep the wounds dry during showering.  You may use an occlusive plastic wrap (Press'n Seal for example), NO SOAKING/SUBMERGING IN THE BATHTUB.  If the  bandage gets wet, change with a clean dry gauze.  If the incision gets wet, pat the wound dry with a clean towel. °You may start showering once you are discharged home but do not submerge the incision under water. Just pat the incision dry and apply a dry gauze dressing on daily. °Change the surgical dressing daily and reapply a dry dressing each time. ° °ACTIVITY °Walk with your walker as instructed. °Use walker as long as suggested by your caregivers. °Avoid periods of inactivity such as sitting longer than an hour when not asleep. This helps prevent blood clots.  °You may resume a sexual relationship in one month or when given the OK by your doctor.  °You may return to work once you are cleared by your doctor.  °Do not drive a car for 6 weeks or until released by you surgeon.  °Do not drive while taking narcotics. ° °WEIGHT BEARING °Weight bearing as tolerated with assist device (walker, cane, etc) as directed, use it as long as suggested by your surgeon or therapist, typically at least 4-6 weeks. ° °POSTOPERATIVE CONSTIPATION PROTOCOL °Constipation - defined medically as fewer than three stools per week and severe constipation as less than one stool per week. ° °One of the most common issues patients have following surgery is constipation.  Even if you have a regular bowel pattern at home, your normal regimen is likely to be disrupted due to multiple reasons following surgery.  Combination of anesthesia, postoperative narcotics, change in appetite and fluid intake all can affect your bowels.    In order to avoid complications following surgery, here are some recommendations in order to help you during your recovery period. ° °Colace (docusate) - Pick up an over-the-counter form of Colace or another stool softener and take twice a day as long as you are requiring postoperative pain medications.  Take with a full glass of water daily.  If you experience loose stools or diarrhea, hold the colace until you stool forms  back up.  If your symptoms do not get better within 1 week or if they get worse, check with your doctor. ° °Dulcolax (bisacodyl) - Pick up over-the-counter and take as directed by the product packaging as needed to assist with the movement of your bowels.  Take with a full glass of water.  Use this product as needed if not relieved by Colace only.  ° °MiraLax (polyethylene glycol) - Pick up over-the-counter to have on hand.  MiraLax is a solution that will increase the amount of water in your bowels to assist with bowel movements.  Take as directed and can mix with a glass of water, juice, soda, coffee, or tea.  Take if you go more than two days without a movement. °Do not use MiraLax more than once per day. Call your doctor if you are still constipated or irregular after using this medication for 7 days in a row. ° °If you continue to have problems with postoperative constipation, please contact the office for further assistance and recommendations.  If you experience "the worst abdominal pain ever" or develop nausea or vomiting, please contact the office immediatly for further recommendations for treatment. ° °ITCHING ° If you experience itching with your medications, try taking only a single pain pill, or even half a pain pill at a time.  You can also use Benadryl over the counter for itching or also to help with sleep.  ° °TED HOSE STOCKINGS °Wear the elastic stockings on both legs for three weeks following surgery during the day but you may remove then at night for sleeping. ° °MEDICATIONS °See your medication summary on the “After Visit Summary” that the nursing staff will review with you prior to discharge.  You may have some home medications which will be placed on hold until you complete the course of blood thinner medication.  It is important for you to complete the blood thinner medication as prescribed by your surgeon.  Continue your approved medications as instructed at time of  discharge. ° °PRECAUTIONS °If you experience chest pain or shortness of breath - call 911 immediately for transfer to the hospital emergency department.  °If you develop a fever greater that 101 F, purulent drainage from wound, increased redness or drainage from wound, foul odor from the wound/dressing, or calf pain - CONTACT YOUR SURGEON.   °                                                °FOLLOW-UP APPOINTMENTS °Make sure you keep all of your appointments after your operation with your surgeon and caregivers. You should call the office at the above phone number and make an appointment for approximately two weeks after the date of your surgery or on the date instructed by your surgeon outlined in the "After Visit Summary". ° ° °RANGE OF MOTION AND STRENGTHENING EXERCISES  °Rehabilitation of the knee is important following a knee injury or   an operation. After just a few days of immobilization, the muscles of the thigh which control the knee become weakened and shrink (atrophy). Knee exercises are designed to build up the tone and strength of the thigh muscles and to improve knee motion. Often times heat used for twenty to thirty minutes before working out will loosen up your tissues and help with improving the range of motion but do not use heat for the first two weeks following surgery. These exercises can be done on a training (exercise) mat, on the floor, on a table or on a bed. Use what ever works the best and is most comfortable for you Knee exercises include:  °Leg Lifts - While your knee is still immobilized in a splint or cast, you can do straight leg raises. Lift the leg to 60 degrees, hold for 3 sec, and slowly lower the leg. Repeat 10-20 times 2-3 times daily. Perform this exercise against resistance later as your knee gets better.  °Quad and Hamstring Sets - Tighten up the muscle on the front of the thigh (Quad) and hold for 5-10 sec. Repeat this 10-20 times hourly. Hamstring sets are done by pushing the  foot backward against an object and holding for 5-10 sec. Repeat as with quad sets.  °· Leg Slides: Lying on your back, slowly slide your foot toward your buttocks, bending your knee up off the floor (only go as far as is comfortable). Then slowly slide your foot back down until your leg is flat on the floor again. °· Angel Wings: Lying on your back spread your legs to the side as far apart as you can without causing discomfort.  °A rehabilitation program following serious knee injuries can speed recovery and prevent re-injury in the future due to weakened muscles. Contact your doctor or a physical therapist for more information on knee rehabilitation.  ° °IF YOU ARE TRANSFERRED TO A SKILLED REHAB FACILITY °If the patient is transferred to a skilled rehab facility following release from the hospital, a list of the current medications will be sent to the facility for the patient to continue.  When discharged from the skilled rehab facility, please have the facility set up the patient's Home Health Physical Therapy prior to being released. Also, the skilled facility will be responsible for providing the patient with their medications at time of release from the facility to include their pain medication, the muscle relaxants, and their blood thinner medication. If the patient is still at the rehab facility at time of the two week follow up appointment, the skilled rehab facility will also need to assist the patient in arranging follow up appointment in our office and any transportation needs. ° °MAKE SURE YOU:  °Understand these instructions.  °Get help right away if you are not doing well or get worse.  ° ° °Pick up stool softner and laxative for home use following surgery while on pain medications. °Do not submerge incision under water. °Please use good hand washing techniques while changing dressing each day. °May shower starting three days after surgery. °Please use a clean towel to pat the incision dry following  showers. °Continue to use ice for pain and swelling after surgery. °Do not use any lotions or creams on the incision until instructed by your surgeon. ° °Take Xarelto for two and a half more weeks, then discontinue Xarelto. °Once the patient has completed the blood thinner regimen, then take a Baby 81 mg Aspirin daily for three more weeks. ° ° °Information   on my medicine - XARELTO® (Rivaroxaban) ° °This medication education was reviewed with me or my healthcare representative as part of my discharge preparation.  The pharmacist that spoke with me during my hospital stay was:  Glogovac,nikola, RPH ° °Why was Xarelto® prescribed for you? °Xarelto® was prescribed for you to reduce the risk of blood clots forming after orthopedic surgery. The medical term for these abnormal blood clots is venous thromboembolism (VTE). ° °What do you need to know about xarelto® ? °Take your Xarelto® ONCE DAILY at the same time every day. °You may take it either with or without food. ° °If you have difficulty swallowing the tablet whole, you may crush it and mix in applesauce just prior to taking your dose. ° °Take Xarelto® exactly as prescribed by your doctor and DO NOT stop taking Xarelto® without talking to the doctor who prescribed the medication.  Stopping without other VTE prevention medication to take the place of Xarelto® may increase your risk of developing a clot. ° °After discharge, you should have regular check-up appointments with your healthcare provider that is prescribing your Xarelto®.   ° °What do you do if you miss a dose? °If you miss a dose, take it as soon as you remember on the same day then continue your regularly scheduled once daily regimen the next day. Do not take two doses of Xarelto® on the same day.  ° °Important Safety Information °A possible side effect of Xarelto® is bleeding. You should call your healthcare provider right away if you experience any of the following: °? Bleeding from an injury or your  nose that does not stop. °? Unusual colored urine (red or dark brown) or unusual colored stools (red or black). °? Unusual bruising for unknown reasons. °? A serious fall or if you hit your head (even if there is no bleeding). ° °Some medicines may interact with Xarelto® and might increase your risk of bleeding while on Xarelto®. To help avoid this, consult your healthcare provider or pharmacist prior to using any new prescription or non-prescription medications, including herbals, vitamins, non-steroidal anti-inflammatory drugs (NSAIDs) and supplements. ° °This website has more information on Xarelto®: www.xarelto.com. ° ° °

## 2016-08-19 NOTE — Progress Notes (Signed)
   Subjective: 1 Day Post-Op Procedure(s) (LRB): LEFT TOTAL KNEE ARTHROPLASTY (Left) Patient reports pain as mild.   Patient seen in rounds for Dr. Wynelle Link. Patient is well, but has had some minor complaints of pain in the knee, requiring pain medications We will start therapy today.  Plan is to go Home after hospital stay.  Objective: Vital signs in last 24 hours: Temp:  [97.5 F (36.4 C)-100.1 F (37.8 C)] 98.9 F (37.2 C) (11/07 0952) Pulse Rate:  [61-92] 72 (11/07 0952) Resp:  [12-18] 18 (11/07 0952) BP: (108-155)/(59-90) 134/64 (11/07 0952) SpO2:  [95 %-100 %] 100 % (11/07 0952) Weight:  [96.2 kg (212 lb)] 96.2 kg (212 lb) (11/06 1515)  Intake/Output from previous day:  Intake/Output Summary (Last 24 hours) at 08/19/16 1025 Last data filed at 08/19/16 0952  Gross per 24 hour  Intake          4198.75 ml  Output             3195 ml  Net          1003.75 ml    Intake/Output this shift: Total I/O In: 240 [P.O.:240] Out: 650 [Urine:650]  Labs:  Recent Labs  08/19/16 0539  HGB 11.1*    Recent Labs  08/19/16 0539  WBC 19.9*  RBC 4.02  HCT 33.0*  PLT 304    Recent Labs  08/19/16 0401  NA 137  K 4.3  CL 105  CO2 24  BUN 13  CREATININE 1.06*  GLUCOSE 143*  CALCIUM 8.1*   No results for input(s): LABPT, INR in the last 72 hours.  EXAM General - Patient is Alert, Appropriate and Oriented Extremity - Neurovascular intact Sensation intact distally Intact pulses distally Dorsiflexion/Plantar flexion intact Dressing - dressing C/D/I Motor Function - intact, moving foot and toes well on exam.  Hemovac pulled without difficulty.  Past Medical History:  Diagnosis Date  . Arthritis    arthritis back and knees  . Blood transfusion 05/2011  . Hypertension   . Transfusion history    s/p 2012 RTKA ([facility])    Assessment/Plan: 1 Day Post-Op Procedure(s) (LRB): LEFT TOTAL KNEE ARTHROPLASTY (Left) Principal Problem:   OA (osteoarthritis) of  knee  Estimated body mass index is 35.28 kg/m as calculated from the following:   Height as of this encounter: 5\' 5"  (1.651 m).   Weight as of this encounter: 96.2 kg (212 lb). Advance diet Up with therapy Plan for discharge tomorrow Discharge home with home health  DVT Prophylaxis - Xarelto Weight-Bearing as tolerated to left leg D/C O2 and Pulse OX and try on Room Air  Arlee Muslim, PA-C Orthopaedic Surgery 08/19/2016, 10:25 AM

## 2016-08-20 LAB — BASIC METABOLIC PANEL
ANION GAP: 7 (ref 5–15)
BUN: 13 mg/dL (ref 6–20)
CHLORIDE: 105 mmol/L (ref 101–111)
CO2: 28 mmol/L (ref 22–32)
Calcium: 8.1 mg/dL — ABNORMAL LOW (ref 8.9–10.3)
Creatinine, Ser: 0.85 mg/dL (ref 0.44–1.00)
GFR calc non Af Amer: >60 mL/min (ref >60)
Glucose, Bld: 119 mg/dL — ABNORMAL HIGH (ref 65–99)
POTASSIUM: 4.2 mmol/L (ref 3.5–5.1)
SODIUM: 140 mmol/L (ref 135–145)

## 2016-08-20 LAB — CBC
HCT: 29.3 % — ABNORMAL LOW (ref 36.0–46.0)
HEMOGLOBIN: 9.8 g/dL — AB (ref 12.0–15.0)
MCH: 27.6 pg (ref 26.0–34.0)
MCHC: 33.4 g/dL (ref 30.0–36.0)
MCV: 82.5 fL (ref 78.0–100.0)
Platelets: 284 10*3/uL (ref 150–400)
RBC: 3.55 MIL/uL — AB (ref 3.87–5.11)
RDW: 15.2 % (ref 11.5–15.5)
WBC: 21.3 10*3/uL — ABNORMAL HIGH (ref 4.0–10.5)

## 2016-08-20 NOTE — Progress Notes (Signed)
   Subjective: 2 Days Post-Op Procedure(s) (LRB): LEFT TOTAL KNEE ARTHROPLASTY (Left) Patient reports pain as mild.   Patient seen in rounds with Dr. Wynelle Link. Patient is well, and has had no acute complaints or problems Patient is ready to go home  Objective: Vital signs in last 24 hours: Temp:  [98.8 F (37.1 C)-100.1 F (37.8 C)] 100.1 F (37.8 C) (11/08 0548) Pulse Rate:  [72-84] 76 (11/08 0548) Resp:  [17-18] 18 (11/08 0548) BP: (124-134)/(56-64) 124/56 (11/08 0548) SpO2:  [98 %-100 %] 100 % (11/08 0548)  Intake/Output from previous day:  Intake/Output Summary (Last 24 hours) at 08/20/16 0705 Last data filed at 08/20/16 0547  Gross per 24 hour  Intake          1398.75 ml  Output             1550 ml  Net          -151.25 ml    Intake/Output this shift: No intake/output data recorded.  Labs:  Recent Labs  08/19/16 0539 08/20/16 0423  HGB 11.1* 9.8*    Recent Labs  08/19/16 0539 08/20/16 0423  WBC 19.9* 21.3*  RBC 4.02 3.55*  HCT 33.0* 29.3*  PLT 304 284    Recent Labs  08/19/16 0401 08/20/16 0423  NA 137 140  K 4.3 4.2  CL 105 105  CO2 24 28  BUN 13 13  CREATININE 1.06* 0.85  GLUCOSE 143* 119*  CALCIUM 8.1* 8.1*   No results for input(s): LABPT, INR in the last 72 hours.  EXAM: General - Patient is Alert, Appropriate and Oriented Extremity - Neurovascular intact Sensation intact distally Dorsiflexion/Plantar flexion intact Incision - clean, dry, no drainage Motor Function - intact, moving foot and toes well on exam.   Assessment/Plan: 2 Days Post-Op Procedure(s) (LRB): LEFT TOTAL KNEE ARTHROPLASTY (Left) Procedure(s) (LRB): LEFT TOTAL KNEE ARTHROPLASTY (Left) Past Medical History:  Diagnosis Date  . Arthritis    arthritis back and knees  . Blood transfusion 05/2011  . Hypertension   . Transfusion history    s/p 2012 RTKA ([facility])   Principal Problem:   OA (osteoarthritis) of knee  Estimated body mass index is 35.28 kg/m as  calculated from the following:   Height as of this encounter: 5\' 5"  (1.651 m).   Weight as of this encounter: 96.2 kg (212 lb). Up with therapy Discharge home with home health Diet - Cardiac diet Follow up - in 2 weeks Activity - WBAT Disposition - Home Condition Upon Discharge - Good D/C Meds - See DC Summary DVT Prophylaxis - Xarelto  Arlee Muslim, PA-C Orthopaedic Surgery 08/20/2016, 7:05 AM

## 2016-08-20 NOTE — Progress Notes (Signed)
Physical Therapy Treatment Patient Details Name: Jennifer [name] MRN: [id number] DOB: May 11, 1961 Today's Date: 08/20/2016    History of Present Illness 54 yo female s/p L TKA 08/18/16. Hx of R TKA    PT Comments    Pt is slowly progressing with mobility. Reviewed/practiced exercises, gait training, and stair training. All education completed.   Follow Up Recommendations  Home health PT;Supervision - Intermittent     Equipment Recommendations  None recommended by PT    Recommendations for Other Services OT consult     Precautions / Restrictions Precautions Precautions: Fall;Knee Required Braces or Orthoses: Knee Immobilizer - Left Knee Immobilizer - Left: Discontinue once straight leg raise with < 10 degree lag Restrictions Weight Bearing Restrictions: No LLE Weight Bearing: Weight bearing as tolerated    Mobility  Bed Mobility               General bed mobility comments: oob in recliner  Transfers Overall transfer level: Needs assistance Equipment used: Rolling walker (2 wheeled) Transfers: Sit to/from Stand   Stand pivot transfers: Min assist       General transfer comment: assist to rise and stabilize.  Cues for UE/LE placement  Ambulation/Gait Ambulation/Gait assistance: Min assist Ambulation Distance (Feet): 50 Feet Assistive device: Rolling walker (2 wheeled) Gait Pattern/deviations: Step-to pattern;Trunk flexed;Antalgic     General Gait Details: Assist to stabilize pt intermittently. Pt fatigues fairly easily.    Stairs Stairs: Yes Stairs assistance: Min assist Stair Management: One rail Right;Forwards;With crutches;Step to pattern Number of Stairs: 4 General stair comments: VCs safety, technique, sequence. Assist to stabilize pt. Pt reports she already has a crutch available at home.  Wheelchair Mobility    Modified Rankin (Stroke Patients Only)       Balance                                    Cognition  Arousal/Alertness: Awake/alert Behavior During Therapy: WFL for tasks assessed/performed Overall Cognitive Status: Within Functional Limits for tasks assessed                      Exercises Total Joint Exercises Ankle Circles/Pumps: AROM;Both;10 reps;Supine Quad Sets: AROM;Both;10 reps;Supine Heel Slides: AAROM;Left;10 reps;Supine Hip ABduction/ADduction: AAROM;Left;10 reps;Supine Straight Leg Raises: AAROM;Left;10 reps;Supine    General Comments        Pertinent Vitals/Pain Pain Assessment: 0-10 Pain Score: 6  Pain Location: L knee Pain Descriptors / Indicators: Aching Pain Intervention(s): Monitored during session;Repositioned    Home Living                      Prior Function            PT Goals (current goals can now be found in the care plan section) Progress towards PT goals: Progressing toward goals    Frequency    7X/week      PT Plan Current plan remains appropriate    Co-evaluation             End of Session Equipment Utilized During Treatment: Gait belt;Left knee immobilizer Activity Tolerance: Patient limited by fatigue;Patient limited by pain Patient left: in chair;with call bell/phone within reach     Time: [id number] PT Time Calculation (min) (ACUTE ONLY): 32 min  Charges:  $Gait Training: 8-22 mins $Therapeutic Exercise: 8-22 mins  G Codes:      Weston Anna, MPT Pager: [phone]

## 2016-08-20 NOTE — Progress Notes (Signed)
Occupational Therapy Treatment Patient Details Name: Jennifer [name] MRN: [id number] DOB: 01-30-1961 Today's Date: 08/20/2016    History of present illness 55 yo female s/p L TKA 08/18/16. Hx of R TKA   OT comments  Goals met this session  Follow Up Recommendations  Supervision/Assistance - 24 hour    Equipment Recommendations  3 in 1 bedside comode    Recommendations for Other Services      Precautions / Restrictions Precautions Precautions: Fall;Knee Required Braces or Orthoses: Knee Immobilizer - Left Knee Immobilizer - Left: Discontinue once straight leg raise with < 10 degree lag Restrictions LLE Weight Bearing: Partial weight bearing       Mobility Bed Mobility                  Transfers                      Balance                                   ADL       Grooming: Oral care;Wash/dry hands;Supervision/safety;Standing                   Toilet Transfer: Minimal assistance;Ambulation;BSC;RW   Toileting- Clothing Manipulation and Hygiene: Minimal assistance;Sit to/from stand                Vision                     Perception     Praxis      Cognition   Behavior During Therapy: [facility] for tasks assessed/performed Overall Cognitive Status: Within Functional Limits for tasks assessed                       Extremity/Trunk Assessment               Exercises     Shoulder Instructions       General Comments      Pertinent Vitals/ Pain       Pain Score: 7  Pain Location: L knee with movement Pain Descriptors / Indicators: Aching Pain Intervention(s): Limited activity within patient's tolerance;Monitored during session;Premedicated before session;Repositioned;Ice applied  Home Living                                          Prior Functioning/Environment              Frequency           Progress Toward Goals  OT Goals(current goals can now be  found in the care plan section)  Progress towards OT goals: Goals met/education completed, patient discharged from [place] of Session     Activity Tolerance Patient tolerated treatment well   Patient Left in chair;with call bell/phone within reach   Nurse Communication          Time: 4854-6270 OT Time Calculation (min): 26 min  Charges: OT General Charges $OT Visit: 1 Procedure OT Treatments $Self Care/Home Management : 23-37 mins  Amarii Amy 08/20/2016, 10:31 AM  Lesle Chris, OTR/L [phone] 08/20/2016

## 2016-08-20 NOTE — Care Management Note (Signed)
Case Management Note  Patient Details  Name: Jennifer [name] MRN: [id number] Date of Birth: 08-05-1961  Subjective/Objective:                  LEFT TOTAL KNEE ARTHROPLASTY (Left) Action/Plan: Discharge planning Expected Discharge Date:  08/20/16               Expected Discharge Plan:  [place]  In-House Referral:     Discharge planning Services  CM Consult  Post Acute Care Choice:  Home Health Choice offered to:  Patient  DME Arranged:  3-N-1 DME Agency:  [place]:  PT [facility] Agency:  [place]  Status of Service:  Completed, signed off  If discussed at [place] of Stay Meetings, dates discussed:    Additional Comments: CM met with pt in room to offer choice of home health agency.  Pt chooses [facility] to render HHPT.  Referral called to [facility] rep, Manuela Schwartz.  CM notified [place] DME rep, to please deliver the 3n1 to room prior to discharge.  Pt states she has a rolling walker at home.  No other CM needs were communicated. Dellie Catholic, RN 08/20/2016, 9:41 AM

## 2016-08-21 DIAGNOSIS — Z96652 Presence of left artificial knee joint: Secondary | ICD-10-CM | POA: Diagnosis not present

## 2016-08-21 DIAGNOSIS — M1991 Primary osteoarthritis, unspecified site: Secondary | ICD-10-CM | POA: Diagnosis not present

## 2016-08-21 DIAGNOSIS — Z471 Aftercare following joint replacement surgery: Secondary | ICD-10-CM | POA: Diagnosis not present

## 2016-08-21 DIAGNOSIS — G8929 Other chronic pain: Secondary | ICD-10-CM | POA: Diagnosis not present

## 2016-08-21 DIAGNOSIS — I10 Essential (primary) hypertension: Secondary | ICD-10-CM | POA: Diagnosis not present

## 2016-08-21 DIAGNOSIS — Z7982 Long term (current) use of aspirin: Secondary | ICD-10-CM | POA: Diagnosis not present

## 2016-08-22 DIAGNOSIS — I10 Essential (primary) hypertension: Secondary | ICD-10-CM | POA: Diagnosis not present

## 2016-08-22 DIAGNOSIS — Z7982 Long term (current) use of aspirin: Secondary | ICD-10-CM | POA: Diagnosis not present

## 2016-08-22 DIAGNOSIS — M1991 Primary osteoarthritis, unspecified site: Secondary | ICD-10-CM | POA: Diagnosis not present

## 2016-08-22 DIAGNOSIS — Z96652 Presence of left artificial knee joint: Secondary | ICD-10-CM | POA: Diagnosis not present

## 2016-08-22 DIAGNOSIS — Z471 Aftercare following joint replacement surgery: Secondary | ICD-10-CM | POA: Diagnosis not present

## 2016-08-22 DIAGNOSIS — G8929 Other chronic pain: Secondary | ICD-10-CM | POA: Diagnosis not present

## 2016-08-25 DIAGNOSIS — G8929 Other chronic pain: Secondary | ICD-10-CM | POA: Diagnosis not present

## 2016-08-25 DIAGNOSIS — I10 Essential (primary) hypertension: Secondary | ICD-10-CM | POA: Diagnosis not present

## 2016-08-25 DIAGNOSIS — Z471 Aftercare following joint replacement surgery: Secondary | ICD-10-CM | POA: Diagnosis not present

## 2016-08-25 DIAGNOSIS — Z7982 Long term (current) use of aspirin: Secondary | ICD-10-CM | POA: Diagnosis not present

## 2016-08-25 DIAGNOSIS — M1991 Primary osteoarthritis, unspecified site: Secondary | ICD-10-CM | POA: Diagnosis not present

## 2016-08-25 DIAGNOSIS — Z96652 Presence of left artificial knee joint: Secondary | ICD-10-CM | POA: Diagnosis not present

## 2016-08-26 DIAGNOSIS — G8929 Other chronic pain: Secondary | ICD-10-CM | POA: Diagnosis not present

## 2016-08-26 DIAGNOSIS — Z96652 Presence of left artificial knee joint: Secondary | ICD-10-CM | POA: Diagnosis not present

## 2016-08-26 DIAGNOSIS — Z7982 Long term (current) use of aspirin: Secondary | ICD-10-CM | POA: Diagnosis not present

## 2016-08-26 DIAGNOSIS — I10 Essential (primary) hypertension: Secondary | ICD-10-CM | POA: Diagnosis not present

## 2016-08-26 DIAGNOSIS — Z471 Aftercare following joint replacement surgery: Secondary | ICD-10-CM | POA: Diagnosis not present

## 2016-08-26 DIAGNOSIS — M1991 Primary osteoarthritis, unspecified site: Secondary | ICD-10-CM | POA: Diagnosis not present

## 2016-08-28 DIAGNOSIS — Z7982 Long term (current) use of aspirin: Secondary | ICD-10-CM | POA: Diagnosis not present

## 2016-08-28 DIAGNOSIS — Z96652 Presence of left artificial knee joint: Secondary | ICD-10-CM | POA: Diagnosis not present

## 2016-08-28 DIAGNOSIS — G8929 Other chronic pain: Secondary | ICD-10-CM | POA: Diagnosis not present

## 2016-08-28 DIAGNOSIS — M1991 Primary osteoarthritis, unspecified site: Secondary | ICD-10-CM | POA: Diagnosis not present

## 2016-08-28 DIAGNOSIS — I10 Essential (primary) hypertension: Secondary | ICD-10-CM | POA: Diagnosis not present

## 2016-08-28 DIAGNOSIS — Z471 Aftercare following joint replacement surgery: Secondary | ICD-10-CM | POA: Diagnosis not present

## 2016-08-29 DIAGNOSIS — M1991 Primary osteoarthritis, unspecified site: Secondary | ICD-10-CM | POA: Diagnosis not present

## 2016-08-29 DIAGNOSIS — Z7982 Long term (current) use of aspirin: Secondary | ICD-10-CM | POA: Diagnosis not present

## 2016-08-29 DIAGNOSIS — Z471 Aftercare following joint replacement surgery: Secondary | ICD-10-CM | POA: Diagnosis not present

## 2016-08-29 DIAGNOSIS — I10 Essential (primary) hypertension: Secondary | ICD-10-CM | POA: Diagnosis not present

## 2016-08-29 DIAGNOSIS — Z96652 Presence of left artificial knee joint: Secondary | ICD-10-CM | POA: Diagnosis not present

## 2016-08-29 DIAGNOSIS — G8929 Other chronic pain: Secondary | ICD-10-CM | POA: Diagnosis not present

## 2016-09-01 DIAGNOSIS — M1991 Primary osteoarthritis, unspecified site: Secondary | ICD-10-CM | POA: Diagnosis not present

## 2016-09-01 DIAGNOSIS — I10 Essential (primary) hypertension: Secondary | ICD-10-CM | POA: Diagnosis not present

## 2016-09-01 DIAGNOSIS — G8929 Other chronic pain: Secondary | ICD-10-CM | POA: Diagnosis not present

## 2016-09-01 DIAGNOSIS — Z7982 Long term (current) use of aspirin: Secondary | ICD-10-CM | POA: Diagnosis not present

## 2016-09-01 DIAGNOSIS — Z96652 Presence of left artificial knee joint: Secondary | ICD-10-CM | POA: Diagnosis not present

## 2016-09-01 DIAGNOSIS — Z471 Aftercare following joint replacement surgery: Secondary | ICD-10-CM | POA: Diagnosis not present

## 2016-09-03 DIAGNOSIS — Z471 Aftercare following joint replacement surgery: Secondary | ICD-10-CM | POA: Diagnosis not present

## 2016-09-08 DIAGNOSIS — M1712 Unilateral primary osteoarthritis, left knee: Secondary | ICD-10-CM | POA: Diagnosis not present

## 2016-09-10 DIAGNOSIS — M1712 Unilateral primary osteoarthritis, left knee: Secondary | ICD-10-CM | POA: Diagnosis not present

## 2016-09-12 DIAGNOSIS — M1712 Unilateral primary osteoarthritis, left knee: Secondary | ICD-10-CM | POA: Diagnosis not present

## 2016-09-16 DIAGNOSIS — M1712 Unilateral primary osteoarthritis, left knee: Secondary | ICD-10-CM | POA: Diagnosis not present

## 2016-09-18 DIAGNOSIS — M1712 Unilateral primary osteoarthritis, left knee: Secondary | ICD-10-CM | POA: Diagnosis not present

## 2016-09-22 DIAGNOSIS — M1712 Unilateral primary osteoarthritis, left knee: Secondary | ICD-10-CM | POA: Diagnosis not present

## 2016-09-25 DIAGNOSIS — M1712 Unilateral primary osteoarthritis, left knee: Secondary | ICD-10-CM | POA: Diagnosis not present

## 2016-09-26 DIAGNOSIS — Z471 Aftercare following joint replacement surgery: Secondary | ICD-10-CM | POA: Diagnosis not present

## 2016-09-26 DIAGNOSIS — Z96652 Presence of left artificial knee joint: Secondary | ICD-10-CM | POA: Diagnosis not present

## 2016-09-29 DIAGNOSIS — M1712 Unilateral primary osteoarthritis, left knee: Secondary | ICD-10-CM | POA: Diagnosis not present

## 2016-10-01 DIAGNOSIS — M1712 Unilateral primary osteoarthritis, left knee: Secondary | ICD-10-CM | POA: Diagnosis not present

## 2016-10-03 DIAGNOSIS — M1712 Unilateral primary osteoarthritis, left knee: Secondary | ICD-10-CM | POA: Diagnosis not present

## 2016-10-08 DIAGNOSIS — M1712 Unilateral primary osteoarthritis, left knee: Secondary | ICD-10-CM | POA: Diagnosis not present

## 2016-10-10 DIAGNOSIS — M1712 Unilateral primary osteoarthritis, left knee: Secondary | ICD-10-CM | POA: Diagnosis not present

## 2016-10-15 DIAGNOSIS — M1712 Unilateral primary osteoarthritis, left knee: Secondary | ICD-10-CM | POA: Diagnosis not present

## 2016-10-17 DIAGNOSIS — M1712 Unilateral primary osteoarthritis, left knee: Secondary | ICD-10-CM | POA: Diagnosis not present

## 2016-10-22 DIAGNOSIS — M1712 Unilateral primary osteoarthritis, left knee: Secondary | ICD-10-CM | POA: Diagnosis not present

## 2016-10-27 DIAGNOSIS — M1712 Unilateral primary osteoarthritis, left knee: Secondary | ICD-10-CM | POA: Diagnosis not present

## 2017-01-15 DIAGNOSIS — Z471 Aftercare following joint replacement surgery: Secondary | ICD-10-CM | POA: Diagnosis not present

## 2017-01-15 DIAGNOSIS — M7652 Patellar tendinitis, left knee: Secondary | ICD-10-CM | POA: Diagnosis not present

## 2017-01-15 DIAGNOSIS — Z96652 Presence of left artificial knee joint: Secondary | ICD-10-CM | POA: Diagnosis not present

## 2017-02-26 DIAGNOSIS — J302 Other seasonal allergic rhinitis: Secondary | ICD-10-CM | POA: Diagnosis not present

## 2017-02-26 DIAGNOSIS — G894 Chronic pain syndrome: Secondary | ICD-10-CM | POA: Diagnosis not present

## 2017-02-26 DIAGNOSIS — I10 Essential (primary) hypertension: Secondary | ICD-10-CM | POA: Diagnosis not present

## 2017-02-26 DIAGNOSIS — Z79899 Other long term (current) drug therapy: Secondary | ICD-10-CM | POA: Diagnosis not present

## 2017-02-26 DIAGNOSIS — R51 Headache: Secondary | ICD-10-CM | POA: Diagnosis not present

## 2017-03-26 DIAGNOSIS — R69 Illness, unspecified: Secondary | ICD-10-CM | POA: Diagnosis not present

## 2017-05-13 DIAGNOSIS — M47816 Spondylosis without myelopathy or radiculopathy, lumbar region: Secondary | ICD-10-CM | POA: Diagnosis not present

## 2017-05-13 DIAGNOSIS — M5106 Intervertebral disc disorders with myelopathy, lumbar region: Secondary | ICD-10-CM | POA: Diagnosis not present

## 2017-05-13 DIAGNOSIS — M542 Cervicalgia: Secondary | ICD-10-CM | POA: Diagnosis not present

## 2017-05-13 DIAGNOSIS — G894 Chronic pain syndrome: Secondary | ICD-10-CM | POA: Diagnosis not present

## 2017-05-13 DIAGNOSIS — M545 Low back pain: Secondary | ICD-10-CM | POA: Diagnosis not present

## 2017-07-09 ENCOUNTER — Other Ambulatory Visit: Payer: Self-pay | Admitting: Internal Medicine

## 2017-07-09 DIAGNOSIS — Z1231 Encounter for screening mammogram for malignant neoplasm of breast: Secondary | ICD-10-CM

## 2017-07-16 DIAGNOSIS — H524 Presbyopia: Secondary | ICD-10-CM | POA: Diagnosis not present

## 2017-07-18 DIAGNOSIS — Z0101 Encounter for examination of eyes and vision with abnormal findings: Secondary | ICD-10-CM | POA: Diagnosis not present

## 2017-07-23 ENCOUNTER — Ambulatory Visit
Admission: RE | Admit: 2017-07-23 | Discharge: 2017-07-23 | Disposition: A | Discharge status: Home / Self Care | Payer: Medicare HMO | Source: Ambulatory Visit | Attending: Internal Medicine | Admitting: Internal Medicine

## 2017-07-23 DIAGNOSIS — I10 Essential (primary) hypertension: Secondary | ICD-10-CM | POA: Diagnosis not present

## 2017-07-23 DIAGNOSIS — Z79899 Other long term (current) drug therapy: Secondary | ICD-10-CM | POA: Diagnosis not present

## 2017-07-23 DIAGNOSIS — Z1231 Encounter for screening mammogram for malignant neoplasm of breast: Secondary | ICD-10-CM | POA: Diagnosis not present

## 2017-07-23 DIAGNOSIS — R5383 Other fatigue: Secondary | ICD-10-CM | POA: Diagnosis not present

## 2017-07-23 DIAGNOSIS — Z1389 Encounter for screening for other disorder: Secondary | ICD-10-CM | POA: Diagnosis not present

## 2017-07-23 DIAGNOSIS — N952 Postmenopausal atrophic vaginitis: Secondary | ICD-10-CM | POA: Diagnosis not present

## 2017-07-23 DIAGNOSIS — Z0000 Encounter for general adult medical examination without abnormal findings: Secondary | ICD-10-CM | POA: Diagnosis not present

## 2017-07-23 DIAGNOSIS — Z1159 Encounter for screening for other viral diseases: Secondary | ICD-10-CM | POA: Diagnosis not present

## 2017-08-05 ENCOUNTER — Ambulatory Visit (INDEPENDENT_AMBULATORY_CARE_PROVIDER_SITE_OTHER): Payer: Medicare HMO | Admitting: Physician Assistant

## 2017-08-05 ENCOUNTER — Encounter: Payer: Self-pay | Admitting: Physician Assistant

## 2017-08-05 VITALS — BP 108/68 | HR 80 | Temp 98.4°F | Resp 16 | Ht 65.0 in | Wt 208.2 lb

## 2017-08-05 DIAGNOSIS — J069 Acute upper respiratory infection, unspecified: Secondary | ICD-10-CM | POA: Diagnosis not present

## 2017-08-05 MED ORDER — HYDROCOD POLST-CPM POLST ER 10-8 MG/5ML PO SUER: 5.0000 mL | Freq: Two times a day (BID) | ORAL | 0 refills | Status: DC | PRN

## 2017-08-05 MED ORDER — BENZONATATE 100 MG PO CAPS: 100.0000 mg | ORAL_CAPSULE | Freq: Three times a day (TID) | ORAL | 0 refills | Status: DC | PRN

## 2017-08-05 NOTE — Patient Instructions (Addendum)
Upper Respiratory Infection, Adult Most upper respiratory infections (URIs) are caused by a virus. A URI affects the nose, throat, and upper air passages. The most common type of URI is often called "the common cold." Follow these instructions at home:  Take medicines only as told by your doctor.  Gargle warm saltwater or take cough drops to comfort your throat as told by your doctor.  Use a warm mist humidifier or inhale steam from a shower to increase air moisture. This may make it easier to breathe.  Drink enough fluid to keep your pee (urine) clear or pale yellow.  Eat soups and other clear broths.  Have a healthy diet.  Rest as needed.  Go back to work when your fever is gone or your doctor says it is okay. ? You may need to stay home longer to avoid giving your URI to others. ? You can also wear a face mask and wash your hands often to prevent spread of the virus.  Use your inhaler more if you have asthma.  Do not use any tobacco products, including cigarettes, chewing tobacco, or electronic cigarettes. If you need help quitting, ask your doctor. Contact a doctor if:  You are getting worse, not better.  Your symptoms are not helped by medicine.  You have chills.  You are getting more short of breath.  You have brown or red mucus.  You have yellow or brown discharge from your nose.  You have pain in your face, especially when you bend forward.  You have a fever.  You have puffy (swollen) neck glands.  You have pain while swallowing.  You have white areas in the back of your throat. Get help right away if:  You have very bad or constant: ? Headache. ? Ear pain. ? Pain in your forehead, behind your eyes, and over your cheekbones (sinus pain). ? Chest pain.  You have long-lasting (chronic) lung disease and any of the following: ? Wheezing. ? Long-lasting cough. ? Coughing up blood. ? A change in your usual mucus.  You have a stiff neck.  You have  changes in your: ? Vision. ? Hearing. ? Thinking. ? Mood. This information is not intended to replace advice given to you by your health care provider. Make sure you discuss any questions you have with your health care provider. Document Released: 03/17/2008 Document Revised: 06/01/2016 Document Reviewed: 01/04/2014 Elsevier Interactive Patient Education  2018 [employer].     IF you received an x-ray today, you will receive an invoice from [facility]. Please contact [facility] at [phone] with questions or concerns regarding your invoice.   IF you received labwork today, you will receive an invoice from [facility]. Please contact [facility] at [phone] with questions or concerns regarding your invoice.   Our billing staff will not be able to assist you with questions regarding bills from these companies.  You will be contacted with the lab results as soon as they are available. The fastest way to get your results is to activate your My Chart account. Instructions are located on the last page of this paperwork. If you have not heard from [place] regarding the results in 2 weeks, please contact this office.

## 2017-08-05 NOTE — Progress Notes (Signed)
PRIMARY CARE AT [facility] [address] [place] [address] [phone]  Date:  08/05/2017   Name:  Jennifer [name]   DOB:  03-27-1961   MRN:  [id number]  PCP:  Ernestene Kiel, MD    History of Present Illness:  Jennifer [name] is a 56 y.o. female patient who presents to PCP with  Chief Complaint  Patient presents with  . Sinus Problem    x5-6 days; sneezing,coughing, and chest congestion     5 days ago, she woke with a scratchy throat.  She then developed coughing, runny nose, and nasal bleeding and progressively worsened symptom.  When she drinks any fluid it feels scratchy.  She has a non-productive cough.  No sob or dyspnea.  No fever.  No facial pain.   No seasonal allergies.   Sick contacts of 56 year old with runny nose.  Patient Active Problem List   Diagnosis Date Noted  . OA (osteoarthritis) of knee 08/18/2016    Past Medical History:  Diagnosis Date  . Arthritis    arthritis back and knees  . Blood transfusion 05/2011  . Hypertension   . Transfusion history    s/p 2012 RTKA [facility])    Past Surgical History:  Procedure Laterality Date  . ABDOMINAL HYSTERECTOMY  1996   partial  . BACK SURGERY  2003/2007   discectomy lower back '03/ '07 rods and artificial disk- Fusion done last time.  . CERVICAL FUSION  2006  . CHOLECYSTECTOMY  2006  . JOINT REPLACEMENT    . TOTAL KNEE ARTHROPLASTY  04/2011   Right  . TOTAL KNEE ARTHROPLASTY Left 08/18/2016   Procedure: LEFT TOTAL KNEE ARTHROPLASTY;  Surgeon: Gaynelle Arabian, MD;  Location: [facility];  Service: Orthopedics;  Laterality: Left;    Social History  Substance Use Topics  . Smoking status: Never Smoker  . Smokeless tobacco: Never Used  . Alcohol use No    Family History  Problem Relation Age of Onset  . Cancer Father   . Diabetes Brother   . Diabetes Brother     Allergies  Allergen Reactions  . Darvocet [Propoxyphene N-Acetaminophen] Swelling  . Latex Hives    "contact only'  . Lodine [Etodolac]  Swelling  . Methocarbamol Swelling    Medication list has been reviewed and updated.  Current Outpatient Prescriptions on File Prior to Visit  Medication Sig Dispense Refill  . cholestyramine (QUESTRAN) 4 GM/DOSE powder Take 4 g by mouth daily as needed (stomach pain).    Marland Kitchen gabapentin (NEURONTIN) 300 MG capsule Take 300 mg by mouth at bedtime.    . hydrochlorothiazide (HYDRODIURIL) 25 MG tablet Take 25 mg by mouth daily.    Marland Kitchen ipratropium (ATROVENT) 0.03 % nasal spray Place 2 sprays into both nostrils 2 (two) times daily. 30 mL 0   No current facility-administered medications on file prior to visit.     ROS ROS otherwise unremarkable unless listed above.  Physical Examination: BP 108/68   Pulse 80   Temp 98.4 F (36.9 C) (Oral)   Resp 16   Ht 5\' 5"  (1.651 m)   Wt 208 lb 3.2 oz (94.4 kg)   SpO2 97%   BMI 34.65 kg/m  Ideal Body Weight: Weight in (lb) to have BMI = 25: 149.9  Physical Exam  Constitutional: She is oriented to person, place, and time. She appears well-developed and well-nourished. No distress.  HENT:  Head: Normocephalic and atraumatic.  Right Ear: Tympanic membrane, external ear and ear canal  normal.  Left Ear: Tympanic membrane, external ear and ear canal normal.  Nose: Mucosal edema and rhinorrhea present. Right sinus exhibits no maxillary sinus tenderness and no frontal sinus tenderness. Left sinus exhibits no maxillary sinus tenderness and no frontal sinus tenderness.  Mouth/Throat: No uvula swelling. No oropharyngeal exudate, posterior oropharyngeal edema or posterior oropharyngeal erythema.  Eyes: Pupils are equal, round, and reactive to light. Conjunctivae and EOM are normal.  Cardiovascular: Normal rate and regular rhythm.  Exam reveals no gallop, no distant heart sounds and no friction rub.   No murmur heard. Pulmonary/Chest: Effort normal. No respiratory distress. She has no decreased breath sounds. She has no wheezes. She has no rhonchi.   Lymphadenopathy:       Head (right side): No submandibular, no tonsillar, no preauricular and no posterior auricular adenopathy present.       Head (left side): No submandibular, no tonsillar, no preauricular and no posterior auricular adenopathy present.  Neurological: She is alert and oriented to person, place, and time.  Skin: She is not diaphoretic.  Psychiatric: She has a normal mood and affect. Her behavior is normal.     Assessment and Plan: Jennifer [name] is a 56 y.o. female who is here today for cc of  Chief Complaint  Patient presents with  . Sinus Problem    x5-6 days; sneezing,coughing, and chest congestion  appears totreat be viral.  Advised to treat supportively at this time.    Acute upper respiratory infection - Plan: benzonatate (TESSALON) 100 MG capsule, chlorpheniramine-HYDROcodone (TUSSIONEX PENNKINETIC ER) 10-8 MG/5ML SUER  Ivar Drape, PA-C [facility] 10/27/20188:59 AM

## 2017-08-12 ENCOUNTER — Telehealth: Payer: Self-pay | Admitting: Physician Assistant

## 2017-08-12 NOTE — Telephone Encounter (Signed)
PATIENT STATES STEPHANIE TREATED HER FOR AN URI ABOUT A WEEK AGO. STEPHANIE WANTED HER TO TRY TO GO WITHOUT TAKING AN ANTIBIOTIC. SHE SAID SHE IS ABOUT THE SAME WITH THE COUGH AND RUNNY NOSE. SHE WOULD LIKE TO GO AHEAD AND HAVE STEPHANIE CALL HER IN AN ANTIBIOTIC PLEASE. BEST PHONE [phone] (CELL) PHARMACY CHOICE IS [facility] ON [address]. [place]

## 2017-08-13 NOTE — Telephone Encounter (Signed)
Pt is checking on status of message 

## 2017-08-14 MED ORDER — AZITHROMYCIN 250 MG PO TABS: ORAL_TABLET | ORAL | 0 refills | Status: DC

## 2017-08-14 NOTE — Telephone Encounter (Signed)
Contact patient. Patient issued azithromycin.  Please take as prescribed

## 2017-08-14 NOTE — Telephone Encounter (Signed)
Pt advised.

## 2017-08-14 NOTE — Telephone Encounter (Signed)
Pleas advise, thank you./ S.Jori Frerichs,CMA

## 2017-08-20 DIAGNOSIS — M7652 Patellar tendinitis, left knee: Secondary | ICD-10-CM | POA: Diagnosis not present

## 2017-08-20 DIAGNOSIS — M1712 Unilateral primary osteoarthritis, left knee: Secondary | ICD-10-CM | POA: Diagnosis not present

## 2017-08-20 DIAGNOSIS — Z471 Aftercare following joint replacement surgery: Secondary | ICD-10-CM | POA: Diagnosis not present

## 2017-08-20 DIAGNOSIS — Z96652 Presence of left artificial knee joint: Secondary | ICD-10-CM | POA: Diagnosis not present

## 2017-09-30 DIAGNOSIS — Z79899 Other long term (current) drug therapy: Secondary | ICD-10-CM | POA: Diagnosis not present

## 2017-09-30 DIAGNOSIS — Z79891 Long term (current) use of opiate analgesic: Secondary | ICD-10-CM | POA: Diagnosis not present

## 2017-09-30 DIAGNOSIS — M545 Low back pain: Secondary | ICD-10-CM | POA: Diagnosis not present

## 2017-09-30 DIAGNOSIS — M5106 Intervertebral disc disorders with myelopathy, lumbar region: Secondary | ICD-10-CM | POA: Diagnosis not present

## 2017-09-30 DIAGNOSIS — G894 Chronic pain syndrome: Secondary | ICD-10-CM | POA: Diagnosis not present

## 2017-10-16 ENCOUNTER — Other Ambulatory Visit: Payer: Self-pay | Admitting: Orthopaedic Surgery

## 2017-10-16 DIAGNOSIS — M545 Low back pain: Secondary | ICD-10-CM

## 2017-10-21 ENCOUNTER — Ambulatory Visit
Admission: RE | Admit: 2017-10-21 | Discharge: 2017-10-21 | Disposition: A | Discharge status: Home / Self Care | Payer: Medicare HMO | Source: Ambulatory Visit | Attending: Orthopaedic Surgery | Admitting: Orthopaedic Surgery

## 2017-10-21 DIAGNOSIS — M48061 Spinal stenosis, lumbar region without neurogenic claudication: Secondary | ICD-10-CM | POA: Diagnosis not present

## 2017-10-21 DIAGNOSIS — M545 Low back pain: Secondary | ICD-10-CM

## 2017-11-12 DIAGNOSIS — M47816 Spondylosis without myelopathy or radiculopathy, lumbar region: Secondary | ICD-10-CM | POA: Diagnosis not present

## 2017-11-12 DIAGNOSIS — M542 Cervicalgia: Secondary | ICD-10-CM | POA: Diagnosis not present

## 2017-11-12 DIAGNOSIS — M545 Low back pain: Secondary | ICD-10-CM | POA: Diagnosis not present

## 2017-12-09 DIAGNOSIS — M47816 Spondylosis without myelopathy or radiculopathy, lumbar region: Secondary | ICD-10-CM | POA: Diagnosis not present

## 2017-12-17 DIAGNOSIS — M1712 Unilateral primary osteoarthritis, left knee: Secondary | ICD-10-CM | POA: Diagnosis not present

## 2017-12-17 DIAGNOSIS — Z96652 Presence of left artificial knee joint: Secondary | ICD-10-CM | POA: Diagnosis not present

## 2017-12-17 DIAGNOSIS — Z471 Aftercare following joint replacement surgery: Secondary | ICD-10-CM | POA: Diagnosis not present

## 2017-12-23 DIAGNOSIS — M542 Cervicalgia: Secondary | ICD-10-CM | POA: Diagnosis not present

## 2017-12-23 DIAGNOSIS — G8929 Other chronic pain: Secondary | ICD-10-CM | POA: Insufficient documentation

## 2017-12-30 DIAGNOSIS — M542 Cervicalgia: Secondary | ICD-10-CM | POA: Diagnosis not present

## 2018-01-06 DIAGNOSIS — M47816 Spondylosis without myelopathy or radiculopathy, lumbar region: Secondary | ICD-10-CM | POA: Diagnosis not present

## 2018-01-07 DIAGNOSIS — M542 Cervicalgia: Secondary | ICD-10-CM | POA: Diagnosis not present

## 2018-01-13 ENCOUNTER — Encounter: Payer: Self-pay | Admitting: Physician Assistant

## 2018-01-14 DIAGNOSIS — M542 Cervicalgia: Secondary | ICD-10-CM | POA: Diagnosis not present

## 2018-01-21 DIAGNOSIS — M542 Cervicalgia: Secondary | ICD-10-CM | POA: Diagnosis not present

## 2018-01-28 DIAGNOSIS — M542 Cervicalgia: Secondary | ICD-10-CM | POA: Diagnosis not present

## 2018-02-04 DIAGNOSIS — M542 Cervicalgia: Secondary | ICD-10-CM | POA: Diagnosis not present

## 2018-02-10 DIAGNOSIS — M542 Cervicalgia: Secondary | ICD-10-CM | POA: Diagnosis not present

## 2018-02-17 DIAGNOSIS — M542 Cervicalgia: Secondary | ICD-10-CM | POA: Diagnosis not present

## 2018-02-18 DIAGNOSIS — K591 Functional diarrhea: Secondary | ICD-10-CM | POA: Diagnosis not present

## 2018-02-18 DIAGNOSIS — Z79899 Other long term (current) drug therapy: Secondary | ICD-10-CM | POA: Diagnosis not present

## 2018-02-18 DIAGNOSIS — I10 Essential (primary) hypertension: Secondary | ICD-10-CM | POA: Diagnosis not present

## 2018-02-24 DIAGNOSIS — M542 Cervicalgia: Secondary | ICD-10-CM | POA: Diagnosis not present

## 2018-02-25 DIAGNOSIS — Z79890 Hormone replacement therapy (postmenopausal): Secondary | ICD-10-CM | POA: Diagnosis not present

## 2018-02-25 DIAGNOSIS — G8929 Other chronic pain: Secondary | ICD-10-CM | POA: Diagnosis not present

## 2018-02-25 DIAGNOSIS — Z8249 Family history of ischemic heart disease and other diseases of the circulatory system: Secondary | ICD-10-CM | POA: Diagnosis not present

## 2018-02-25 DIAGNOSIS — Z79891 Long term (current) use of opiate analgesic: Secondary | ICD-10-CM | POA: Diagnosis not present

## 2018-02-25 DIAGNOSIS — J309 Allergic rhinitis, unspecified: Secondary | ICD-10-CM | POA: Diagnosis not present

## 2018-02-25 DIAGNOSIS — Z6837 Body mass index (BMI) 37.0-37.9, adult: Secondary | ICD-10-CM | POA: Diagnosis not present

## 2018-02-25 DIAGNOSIS — Z833 Family history of diabetes mellitus: Secondary | ICD-10-CM | POA: Diagnosis not present

## 2018-02-25 DIAGNOSIS — Z809 Family history of malignant neoplasm, unspecified: Secondary | ICD-10-CM | POA: Diagnosis not present

## 2018-02-25 DIAGNOSIS — I10 Essential (primary) hypertension: Secondary | ICD-10-CM | POA: Diagnosis not present

## 2018-02-25 DIAGNOSIS — M25562 Pain in left knee: Secondary | ICD-10-CM | POA: Diagnosis not present

## 2018-03-10 ENCOUNTER — Ambulatory Visit: Payer: Medicare HMO | Attending: Orthopedic Surgery | Admitting: Physical Therapy

## 2018-03-10 ENCOUNTER — Other Ambulatory Visit: Payer: Self-pay

## 2018-03-10 DIAGNOSIS — R29898 Other symptoms and signs involving the musculoskeletal system: Secondary | ICD-10-CM | POA: Insufficient documentation

## 2018-03-10 DIAGNOSIS — M25562 Pain in left knee: Secondary | ICD-10-CM | POA: Diagnosis not present

## 2018-03-10 DIAGNOSIS — G8929 Other chronic pain: Secondary | ICD-10-CM | POA: Insufficient documentation

## 2018-03-10 NOTE — Therapy (Signed)
[facility] [address]  [place], [place], [address] Phone: [phone]   Fax:  [phone]  Physical Therapy Evaluation  Patient Details  Name: Jennifer [name] MRN: [id number] Date of Birth: 10-19-1960 Referring Provider: Dr. Gaynelle Arabian   Encounter Date: 03/10/2018  PT End of Session - 03/10/18 0917    Visit Number  1    Number of Visits  8    Date for PT Re-Evaluation  04/07/18    Authorization Type  Aetna Medicare    PT Start Time  [phone]    PT Stop Time  0929    PT Time Calculation (min)  42 min    Activity Tolerance  Patient tolerated treatment well    Behavior During Therapy  [facility] for tasks assessed/performed       Past Medical History:  Diagnosis Date  . Arthritis    arthritis back and knees  . Blood transfusion 05/2011  . Hypertension   . Transfusion history    s/p 2012 RTKA [facility])    Past Surgical History:  Procedure Laterality Date  . ABDOMINAL HYSTERECTOMY  1996   partial  . BACK SURGERY  2003/2007   discectomy lower back '03/ '07 rods and artificial disk- Fusion done last time.  . CERVICAL FUSION  2006  . CHOLECYSTECTOMY  2006  . JOINT REPLACEMENT    . TOTAL KNEE ARTHROPLASTY  04/2011   Right  . TOTAL KNEE ARTHROPLASTY Left 08/18/2016   Procedure: LEFT TOTAL KNEE ARTHROPLASTY;  Surgeon: Gaynelle Arabian, MD;  Location: [facility];  Service: Orthopedics;  Laterality: Left;    There were no vitals filed for this visit.   Subjective Assessment - 03/10/18 0850    Subjective  Patient s/p L knee 08/2016 - since then having pain and tightness of knee. Reports a history of falls - 1 fall in the past 6 months - feels like knee gives out. Pain mostly on lateral side of knee. Feels like she can bend her knee but its not comfortable. Also reports some numbness at lateral knee. Difficulty sleeping at night. Patient reports MD wants to try PT and may be a surgical case if pain does not resolve.     Pertinent  History  B TKA, HTN, multiple back surgeries    How long can you sit comfortably?  30-60 min    How long can you stand comfortably?  30 min    How long can you walk comfortably?  1 hour    Diagnostic tests  xray: negative    Patient Stated Goals  improve pain    Currently in Pain?  Yes    Pain Score  7     Pain Location  Knee    Pain Orientation  Left;Lateral    Pain Descriptors / Indicators  Aching;Dull;Sharp    Pain Type  Chronic pain    Pain Onset  More than a month ago    Pain Frequency  Constant    Aggravating Factors   prolonged positioning    Pain Relieving Factors  nothing         [facility] PT Assessment - 03/10/18 0855      Assessment   Medical Diagnosis  Left knee pain    Referring Provider  Dr. Gaynelle Arabian    Onset Date/Surgical Date  -- 08/2016    Next MD Visit  04/08/18    Prior Therapy  yes      Precautions   Precautions  None      Restrictions   Weight Bearing Restrictions  No      Balance Screen   Has the patient fallen in the past 6 months  Yes    How many times?  1    Has the patient had a decrease in activity level because of a fear of falling?   No    Is the patient reluctant to leave their home because of a fear of falling?   No      Home Environment   Living Environment  Private residence    Type of [place] Access  Level entry    Home Layout  One level    Additional Comments  1 step into kitchen      Prior Function   Level of Independence  Independent    Vocation  On disability      Cognition   Overall Cognitive Status  Within Functional Limits for tasks assessed      Observation/Other Assessments   Focus on Therapeutic Outcomes (FOTO)   Knee: 32 (68% limited, predicted 47% limited)      Sensation   Light Touch  Appears Intact      Coordination   Gross Motor Movements are Fluid and Coordinated  Yes      Posture/Postural Control   Posture/Postural Control  Postural limitations    Postural Limitations  Forward head;Rounded  Shoulders      ROM / Strength   AROM / PROM / Strength  AROM;Strength      AROM   AROM Assessment Site  Knee    Right/Left Knee  Left    Left Knee Extension  -1    Left Knee Flexion  113      Strength   Strength Assessment Site  Knee;Hip    Right/Left Hip  Right;Left    Right Hip Flexion  4+/5    Left Hip Flexion  4/5    Right/Left Knee  Right;Left    Right Knee Flexion  4+/5    Right Knee Extension  4+/5    Left Knee Flexion  4+/5    Left Knee Extension  4/5      Flexibility   Soft Tissue Assessment /Muscle Length  yes    Hamstrings  L WNL    Quadriceps  L moderate tightness + pain provocation    ITB  L moderate tightness      Palpation   Patella mobility  good mobility - all planes    Palpation comment  TTP along ITB band and ITB insertion                Objective measurements completed on examination: See above findings.              PT Education - 03/10/18 0916    Education provided  Yes    Education Details  exam findings, POC, HEP    Person(s) Educated  Patient    Methods  Explanation;Demonstration;Handout    Comprehension  Verbalized understanding;Returned demonstration          PT Long Term Goals - 03/10/18 0924      PT LONG TERM GOAL #1   Title  patient to be independent with advanced HEP    Status  New    Target Date  04/07/18      PT LONG TERM GOAL #2   Title  patient to demonstrate improved tissue pliability as demonstrated by improved flexibility and  less TTP    Status  New    Target Date  04/07/18      PT LONG TERM GOAL #3   Title  patient to report ability to walk/stand for >/= 1 hour without increase in pain    Status  New    Target Date  04/07/18      PT LONG TERM GOAL #4   Title  patient to report pain reduction by >/= 50% for greater than 2 weeks    Status  New    Target Date  04/07/18             Plan - 03/10/18 0932    Clinical Impression Statement  [name] is a pleasant 57 y/o female presenting  to OPPT today regarding primary complaints of L knee pain. Patient s/p L TKA 08/2016. Patient reporting difficulty with prolonged positioning as well as prolonged walking and activity. TTP noted at ITB band and insertion, but iwth good patellar mobility in all planes. Patient today given initial HEP for gentle stretching and strengthening with good tolerance and carryover. Patient to benefit from skilled PT to address pain and functional mobility limitations.     Clinical Presentation  Stable    Clinical Decision Making  Low    Rehab Potential  Good    PT Frequency  2x / week    PT Duration  4 weeks    PT Treatment/Interventions  ADLs/Self Care Home Management;Cryotherapy;Electrical Stimulation;Iontophoresis 4mg /ml Dexamethasone;Moist Heat;Therapeutic activities;Functional mobility training;Stair training;Gait training;Balance training;Neuromuscular re-education;Patient/family education;Manual techniques;Therapeutic exercise;Vasopneumatic Device;Taping;Dry needling;Passive range of motion    Consulted and Agree with Plan of Care  Patient       Patient will benefit from skilled therapeutic intervention in order to improve the following deficits and impairments:  Abnormal gait, Decreased activity tolerance, Decreased range of motion, Decreased mobility, Difficulty walking, Decreased strength, Pain  Visit Diagnosis: Chronic pain of left knee - Plan: PT plan of care cert/re-cert  Other symptoms and signs involving the musculoskeletal system - Plan: PT plan of care cert/re-cert     Problem List Patient Active Problem List   Diagnosis Date Noted  . OA (osteoarthritis) of knee 08/18/2016     Lanney Gins, PT, DPT 03/10/18 9:51 AM   [facility] [address]  [place], [place], [address] Phone: [phone]   Fax:  [phone]  Name: CORLISS COGGESHALL MRN: [id number] Date of Birth: 1961-03-22

## 2018-03-10 NOTE — Patient Instructions (Signed)
Outer Hip Stretch: Reclined IT Band Stretch (Strap)   Strap around opposite foot, pull across only as far as possible with shoulders on mat. Hold for __30__ breaths. Repeat __3__ times each leg.  KNEE: Quadriceps - Prone   Place strap around ankle. Bring ankle toward buttocks. Press hip into surface.  Hold _30__ seconds. _3__ reps per set.   Strengthening: Straight Leg Raise (Phase 1)   Tighten muscles on front of right thigh, then lift leg __~8__ inches from surface, keeping knee locked.  Repeat _15___ times per set. Do _2__ sets per session.   Straight Leg Raise: With External Leg Rotation   Lie on back with right leg straight, opposite leg bent. Rotate straight leg out and lift _~8___ inches. Repeat __15__ times per set. Do __2__ sets per session.   Bridge   Lie back, legs bent. Inhale, pressing hips up. Keeping ribs in, lengthen lower back. Exhale, rolling down along spine from top. Repeat __15__ times. Do __2__ sessions per day.

## 2018-03-18 ENCOUNTER — Ambulatory Visit: Payer: Medicare HMO | Attending: Orthopedic Surgery | Admitting: Physical Therapy

## 2018-03-18 ENCOUNTER — Encounter: Payer: Self-pay | Admitting: Physical Therapy

## 2018-03-18 DIAGNOSIS — M25562 Pain in left knee: Secondary | ICD-10-CM | POA: Diagnosis not present

## 2018-03-18 DIAGNOSIS — G8929 Other chronic pain: Secondary | ICD-10-CM | POA: Insufficient documentation

## 2018-03-18 DIAGNOSIS — R29898 Other symptoms and signs involving the musculoskeletal system: Secondary | ICD-10-CM | POA: Diagnosis not present

## 2018-03-18 NOTE — Therapy (Signed)
[facility] [address]  [place], [place], [address] Phone: [phone]   Fax:  [phone]  Physical Therapy Treatment  Patient Details  Name: Jennifer [name] MRN: [id number] Date of Birth: 06/28/1961 Referring Provider: Dr. Gaynelle Arabian   Encounter Date: 03/18/2018  PT End of Session - 03/18/18 1655    Visit Number  2    Number of Visits  8    Date for PT Re-Evaluation  04/07/18    Authorization Type  Aetna Medicare    PT Start Time  1608    PT Stop Time  1651    PT Time Calculation (min)  43 min    Activity Tolerance  Patient tolerated treatment well    Behavior During Therapy  [facility] for tasks assessed/performed       Past Medical History:  Diagnosis Date  . Arthritis    arthritis back and knees  . Blood transfusion 05/2011  . Hypertension   . Transfusion history    s/p 2012 RTKA [facility])    Past Surgical History:  Procedure Laterality Date  . ABDOMINAL HYSTERECTOMY  1996   partial  . BACK SURGERY  2003/2007   discectomy lower back '03/ '07 rods and artificial disk- Fusion done last time.  . CERVICAL FUSION  2006  . CHOLECYSTECTOMY  2006  . JOINT REPLACEMENT    . TOTAL KNEE ARTHROPLASTY  04/2011   Right  . TOTAL KNEE ARTHROPLASTY Left 08/18/2016   Procedure: LEFT TOTAL KNEE ARTHROPLASTY;  Surgeon: Gaynelle Arabian, MD;  Location: [facility];  Service: Orthopedics;  Laterality: Left;    There were no vitals filed for this visit.  Subjective Assessment - 03/18/18 1610    Subjective  Patient reports that she is having most of her pain at night. Has been doing exercises and that is giving her some relief. Denies falls since last session.    Pertinent History  B TKA, HTN, multiple back surgeries    Diagnostic tests  xray: negative    Patient Stated Goals  improve pain    Currently in Pain?  Yes    Pain Score  3     Pain Location  Knee    Pain Orientation  Left;Lateral    Pain Descriptors / Indicators   Aching;Patsi Sears Adult PT Treatment/Exercise - 03/18/18 0001      Exercises   Exercises  Knee/Hip      Knee/Hip Exercises: Stretches   Quad Stretch  Left;2 reps;20 seconds    Quad Stretch Limitations  prone with strap    ITB Stretch  Left;2 reps;20 seconds with strap      Knee/Hip Exercises: Aerobic   Nustep  L3x47min      Knee/Hip Exercises: Standing   Lateral Step Up  Left;1 set;10 reps;Hand Hold: 0;Step Height: 4" fear of falling and clutching at PT; VCs for foot placement    Forward Step Up  Left;Step Height: 6";10 reps sudden instability when L knee gave out but able to correct      Knee/Hip Exercises: Supine   Bridges  Strengthening;Both;1 set;15 reps    Straight Leg Raises  Left;Strengthening;10 reps;1 set    Straight Leg Raise with External Rotation  Strengthening;Left;1 set;10 reps    Knee Extension  Strengthening;Both;1 set;10 reps B LEs on pball resisted hip flexion with red TB  Knee/Hip Exercises: Sidelying   Clams  10x  VCs to avoid trunk rotation      Manual Therapy   Manual Therapy  Soft tissue mobilization    Soft tissue mobilization  L ITB and TFL soft tissue restriction and TTP                  PT Long Term Goals - 03/10/18 0924      PT LONG TERM GOAL #1   Title  patient to be independent with advanced HEP    Status  New    Target Date  04/07/18      PT LONG TERM GOAL #2   Title  patient to demonstrate improved tissue pliability as demonstrated by improved flexibility and less TTP    Status  New    Target Date  04/07/18      PT LONG TERM GOAL #3   Title  patient to report ability to walk/stand for >/= 1 hour without increase in pain    Status  New    Target Date  04/07/18      PT LONG TERM GOAL #4   Title  patient to report pain reduction by >/= 50% for greater than 2 weeks    Status  New    Target Date  04/07/18            Plan - 03/18/18 1655    Clinical Impression Statement   Patient arrived to appointment with report that L knee is most painful at night but HEP has been giving her some relief. Patient still with report that L ITB insertion is tender- tolerated STM to ITB insertion and TFL to tolerance. Mild report of pain when flexing B knees to get into position for ther-ex. Progressed hip strengthening this date with good carryover and form. Performed anterior and lateral step ups with CGA for balance- patient with intermittent L knee buckling during these activities causing clutching of PT to keep balance but able to self-correct. Offered ice at end of session- patient declined. Reports she has intermittent buckling of alternating knees which is what often causes her to fall at home. Plan to continue quad strengthening and balance training to prevent future falls.    PT Treatment/Interventions  ADLs/Self Care Home Management;Cryotherapy;Electrical Stimulation;Iontophoresis 4mg /ml Dexamethasone;Moist Heat;Therapeutic activities;Functional mobility training;Stair training;Gait training;Balance training;Neuromuscular re-education;Patient/family education;Manual techniques;Therapeutic exercise;Vasopneumatic Device;Taping;Dry needling;Passive range of motion    PT Next Visit Plan  progress LE strengthening/stretching, balance straining with gait belt and CGA    Consulted and Agree with Plan of Care  Patient       Patient will benefit from skilled therapeutic intervention in order to improve the following deficits and impairments:  Abnormal gait, Decreased activity tolerance, Decreased range of motion, Decreased mobility, Difficulty walking, Decreased strength, Pain  Visit Diagnosis: Chronic pain of left knee  Other symptoms and signs involving the musculoskeletal system     Problem List Patient Active Problem List   Diagnosis Date Noted  . OA (osteoarthritis) of knee 08/18/2016    Janene Harvey, PT, DPT 03/18/18 5:57 PM   [facility] [address]  [place], [place], [address] Phone: [phone]   Fax:  [phone]  Name: DERA VANAKEN MRN: [id number] Date of Birth: May 03, 1961

## 2018-03-19 ENCOUNTER — Encounter: Payer: Self-pay | Admitting: Physician Assistant

## 2018-03-19 ENCOUNTER — Other Ambulatory Visit: Payer: Self-pay

## 2018-03-19 ENCOUNTER — Ambulatory Visit (INDEPENDENT_AMBULATORY_CARE_PROVIDER_SITE_OTHER): Payer: Medicare HMO

## 2018-03-19 ENCOUNTER — Ambulatory Visit (INDEPENDENT_AMBULATORY_CARE_PROVIDER_SITE_OTHER): Payer: Medicare HMO | Admitting: Physician Assistant

## 2018-03-19 VITALS — BP 123/84 | HR 78 | Temp 98.9°F | Resp 18

## 2018-03-19 DIAGNOSIS — R059 Cough, unspecified: Secondary | ICD-10-CM

## 2018-03-19 DIAGNOSIS — J069 Acute upper respiratory infection, unspecified: Secondary | ICD-10-CM

## 2018-03-19 DIAGNOSIS — R0789 Other chest pain: Secondary | ICD-10-CM | POA: Diagnosis not present

## 2018-03-19 DIAGNOSIS — R079 Chest pain, unspecified: Secondary | ICD-10-CM

## 2018-03-19 DIAGNOSIS — R0981 Nasal congestion: Secondary | ICD-10-CM | POA: Diagnosis not present

## 2018-03-19 DIAGNOSIS — R05 Cough: Secondary | ICD-10-CM

## 2018-03-19 MED ORDER — HYDROCODONE-HOMATROPINE 5-1.5 MG/5ML PO SYRP: 5.0000 mL | ORAL_SOLUTION | Freq: Three times a day (TID) | ORAL | 0 refills | Status: DC | PRN

## 2018-03-19 MED ORDER — FLUTICASONE PROPIONATE 50 MCG/ACT NA SUSP: 2.0000 | Freq: Every day | NASAL | 12 refills | Status: DC

## 2018-03-19 MED ORDER — DICLOFENAC SODIUM 1 % TD GEL: 2.0000 g | Freq: Four times a day (QID) | TRANSDERMAL | 0 refills | Status: DC

## 2018-03-19 MED ORDER — BENZONATATE 100 MG PO CAPS: 100.0000 mg | ORAL_CAPSULE | Freq: Three times a day (TID) | ORAL | 0 refills | Status: DC | PRN

## 2018-03-19 NOTE — Progress Notes (Signed)
Jennifer [name]  MRN: [id number] DOB: Mar 07, 1961  PCP: Ernestene Kiel, MD  Subjective:  Pt is a pleasant 57 year old female who presents to clinic for chest pain.  Chest pain woke her from her sleep last night around 2 am. She endorses a cough that started yesterday.  Denies fever, chills, diaphoresis, n/v, neck pain, left arm pain, doe.    Review of Systems  Constitutional: Negative for chills, diaphoresis, fatigue and fever.  Respiratory: Positive for cough, chest tightness and shortness of breath. Negative for wheezing.   Cardiovascular: Positive for chest pain. Negative for palpitations and leg swelling.  Gastrointestinal: Negative for diarrhea, nausea and vomiting.  Neurological: Negative for dizziness, weakness, light-headedness and headaches.  Psychiatric/Behavioral: Positive for sleep disturbance.    Patient Active Problem List   Diagnosis Date Noted  . OA (osteoarthritis) of knee 08/18/2016    Current Outpatient Medications on File Prior to Visit  Medication Sig Dispense Refill  . azithromycin (ZITHROMAX) 250 MG tablet Take 2 tabs PO x 1 dose, then 1 tab PO QD x 4 days 6 tablet 0  . benzonatate (TESSALON) 100 MG capsule Take 1-2 capsules (100-200 mg total) by mouth 3 (three) times daily as needed for cough. 40 capsule 0  . chlorpheniramine-HYDROcodone (TUSSIONEX PENNKINETIC ER) 10-8 MG/5ML SUER Take 5 mLs by mouth every 12 (twelve) hours as needed. 50 mL 0  . cholestyramine (QUESTRAN) 4 GM/DOSE powder Take 4 g by mouth daily as needed (stomach pain).    Marland Kitchen gabapentin (NEURONTIN) 300 MG capsule Take 300 mg by mouth at bedtime.    . hydrochlorothiazide (HYDRODIURIL) 25 MG tablet Take 25 mg by mouth daily.    Marland Kitchen ipratropium (ATROVENT) 0.03 % nasal spray Place 2 sprays into both nostrils 2 (two) times daily. 30 mL 0  . traMADol (ULTRAM) 50 MG tablet Take by mouth every 6 (six) hours as needed.     No current facility-administered medications on file prior to visit.      Allergies  Allergen Reactions  . Darvocet [Propoxyphene N-Acetaminophen] Swelling  . Latex Hives    "contact only'  . Lodine [Etodolac] Swelling  . Methocarbamol Swelling     Objective:  BP 123/84   Pulse 78   Temp 98.9 F (37.2 C) (Oral)   Resp 18   SpO2 98%   Physical Exam  Constitutional: She is oriented to person, place, and time. She appears well-developed and well-nourished. No distress.  Cardiovascular: Normal rate, regular rhythm, normal heart sounds, intact distal pulses and normal pulses.  Pulmonary/Chest: Effort normal and breath sounds normal. She has no decreased breath sounds. She has no wheezes. She has no rhonchi. She has no rales. She exhibits tenderness (Moderate TTP left and right mid chest. ). She exhibits no mass, no crepitus and no edema.    Neurological: She is alert and oriented to person, place, and time.  Skin: Skin is warm and dry. She is not diaphoretic.  Psychiatric: Judgment normal.  Vitals reviewed.   Dg Chest 2 View  Result Date: 03/19/2018 CLINICAL DATA:  Chest pain and cough.  Abnormal EKG. EXAM: CHEST - 2 VIEW COMPARISON:  07/25/2014. FINDINGS: Normal sized heart. Clear lungs. Minimal peribronchial thickening. Cholecystectomy clips. Unremarkable bones. IMPRESSION: Normal bronchitic changes. Electronically Signed   By: Claudie Revering M.D.   On: 03/19/2018 16:18   EKG - no ST abnormalities or t-wave inversions. Unchanged compared to prior tracings.  Assessment and Plan :  This case was discussed with Dr. Carlota Raspberry.  1. Chest pain, unspecified type - EKG 12-Lead - DG Chest 2 View; Future - Pt presents c/o chest pain x 1 day. EKG is not concerning for S/T elevations. Chest x-ray shows minimal peribronchial thickening. She is moderately tender to palpation anterior chest wall. Suspect MSK pain causing her chest discomfort. Plan to closely monitor. F/u in 48 hours. Will treat chest wall pain with voltaren gel and cough suppression. She understands  and agrees. F/u with cardiology for atypical chest pain.   2. Chest wall pain - diclofenac sodium (VOLTAREN) 1 % GEL; Apply 2 g topically 4 (four) times daily.  Dispense: 100 g; Refill: 0  3. Acute upper respiratory infection 4. Cough - benzonatate (TESSALON) 100 MG capsule; Take 1-2 capsules (100-200 mg total) by mouth 3 (three) times daily as needed for cough.  Dispense: 40 capsule; Refill: 0 - HYDROcodone-homatropine (HYCODAN) 5-1.5 MG/5ML syrup; Take 5 mLs by mouth every 8 (eight) hours as needed for cough.  Dispense: 120 mL; Refill: 0  5. Atypical chest pain - Ambulatory referral to Cardiology  6. Nasal congestion - fluticasone (FLONASE) 50 MCG/ACT nasal spray; Place 2 sprays into both nostrils daily.  Dispense: 16 g; Refill: 12   Whitney Berenice Oehlert, PA-C  [facility] 03/19/2018 3:55 PM

## 2018-03-19 NOTE — Patient Instructions (Addendum)
Your EKG looks overall unchanged from prior EKG.  Your chest x-ray shows some bronchitic changes.  I am treating you today for chest wall pain due to your cough.   Please go directly to the emergency department if your symptoms worsen and/or if you develop symptoms of shortness of breast, worsening chest pain, sweating, left arm or neck pain.   -Take Tylenol every 4 hours and Voltaren gel for pain.  -Hycodan is a hydrocodone- based cough syrup. Please use this only as directed. -Tessalon (benzonatate) is for cough as needed.    -Come back in 24-48 hours to recheck.  -You will receive a phone call to schedule an appointment with cardiology.  -Please follow-up with your primary care provider.    Thank you for coming in today. I hope you feel we met your needs.  Feel free to call PCP if you have any questions or further requests.  Please consider signing up for MyChart if you do not already have it, as this is a great way to communicate with me.  Best,  [employer], PA-C

## 2018-03-23 ENCOUNTER — Other Ambulatory Visit: Payer: Self-pay

## 2018-03-23 ENCOUNTER — Ambulatory Visit: Payer: Medicare HMO

## 2018-03-23 ENCOUNTER — Ambulatory Visit (INDEPENDENT_AMBULATORY_CARE_PROVIDER_SITE_OTHER): Payer: Medicare HMO | Admitting: Physician Assistant

## 2018-03-23 ENCOUNTER — Encounter: Payer: Self-pay | Admitting: Physician Assistant

## 2018-03-23 VITALS — BP 136/83 | HR 67 | Temp 98.0°F | Resp 18 | Ht 64.57 in | Wt 212.8 lb

## 2018-03-23 DIAGNOSIS — R0789 Other chest pain: Secondary | ICD-10-CM

## 2018-03-23 DIAGNOSIS — R29898 Other symptoms and signs involving the musculoskeletal system: Secondary | ICD-10-CM

## 2018-03-23 DIAGNOSIS — G8929 Other chronic pain: Secondary | ICD-10-CM | POA: Diagnosis not present

## 2018-03-23 DIAGNOSIS — R05 Cough: Secondary | ICD-10-CM

## 2018-03-23 DIAGNOSIS — M25562 Pain in left knee: Secondary | ICD-10-CM | POA: Diagnosis not present

## 2018-03-23 DIAGNOSIS — R059 Cough, unspecified: Secondary | ICD-10-CM

## 2018-03-23 NOTE — Progress Notes (Signed)
ANALIZ TVEDT  MRN: [id number] DOB: 23-Jan-1961  PCP: Ernestene Kiel, MD  Subjective:  Pt is a pleasant 57 year old female who presents to clinic for f/u cough.  She was here 4 days ago c/o chest pain that woke her from sleep. She was treated for MSK pain and cough with voltaren gel and hycodan. Today she reports that she is feeling much better. Chest pain resolved. She has a "nagging dry" cough with occasional clear drainage. She is staying well hydrated and sleeping well.  ROS below.   Review of Systems  Constitutional: Negative for chills, diaphoresis, fatigue and fever.  Respiratory: Positive for cough. Negative for chest tightness, shortness of breath and wheezing.   Cardiovascular: Negative for chest pain and palpitations.    Patient Active Problem List   Diagnosis Date Noted  . OA (osteoarthritis) of knee 08/18/2016    Current Outpatient Medications on File Prior to Visit  Medication Sig Dispense Refill  . benzonatate (TESSALON) 100 MG capsule Take 1-2 capsules (100-200 mg total) by mouth 3 (three) times daily as needed for cough. 40 capsule 0  . cholestyramine (QUESTRAN) 4 GM/DOSE powder Take 4 g by mouth daily as needed (stomach pain).    Marland Kitchen diclofenac sodium (VOLTAREN) 1 % GEL Apply 2 g topically 4 (four) times daily. 100 g 0  . fluticasone (FLONASE) 50 MCG/ACT nasal spray Place 2 sprays into both nostrils daily. 16 g 12  . hydrochlorothiazide (HYDRODIURIL) 25 MG tablet Take 25 mg by mouth daily.    Marland Kitchen HYDROcodone-homatropine (HYCODAN) 5-1.5 MG/5ML syrup Take 5 mLs by mouth every 8 (eight) hours as needed for cough. 120 mL 0  . traMADol (ULTRAM) 50 MG tablet Take by mouth every 6 (six) hours as needed.    Marland Kitchen azithromycin (ZITHROMAX) 250 MG tablet Take 2 tabs PO x 1 dose, then 1 tab PO QD x 4 days (Patient not taking: Reported on 03/23/2018) 6 tablet 0  . benzonatate (TESSALON) 100 MG capsule Take 1-2 capsules (100-200 mg total) by mouth 3 (three) times daily as needed for  cough. (Patient not taking: Reported on 03/19/2018) 40 capsule 0  . chlorpheniramine-HYDROcodone (TUSSIONEX PENNKINETIC ER) 10-8 MG/5ML SUER Take 5 mLs by mouth every 12 (twelve) hours as needed. (Patient not taking: Reported on 03/19/2018) 50 mL 0  . gabapentin (NEURONTIN) 300 MG capsule Take 300 mg by mouth at bedtime.    Marland Kitchen ipratropium (ATROVENT) 0.03 % nasal spray Place 2 sprays into both nostrils 2 (two) times daily. (Patient not taking: Reported on 03/23/2018) 30 mL 0   No current facility-administered medications on file prior to visit.     Allergies  Allergen Reactions  . Darvocet [Propoxyphene N-Acetaminophen] Swelling  . Latex Hives    "contact only'  . Lodine [Etodolac] Swelling  . Methocarbamol Swelling     Objective:  BP 136/83 (BP Location: Right Arm, Patient Position: Sitting, Cuff Size: Large)   Pulse 67   Temp 98 F (36.7 C) (Oral)   Resp 18   Ht 5' 4.57" (1.64 m)   Wt 212 lb 12.8 oz (96.5 kg)   SpO2 98%   BMI 35.89 kg/m   Physical Exam  Constitutional: She is oriented to person, place, and time. No distress.  Cardiovascular: Normal rate, regular rhythm and normal heart sounds.  Pulmonary/Chest: Effort normal and breath sounds normal. No respiratory distress. She has no wheezes. She has no rales. She exhibits no tenderness.  Neurological: She is alert and oriented to person, place,  and time.  Skin: Skin is warm and dry.  Psychiatric: Judgment normal.  Vitals reviewed.   Assessment and Plan :  1. Cough 2. Chest wall pain - Pt presents for f/u chest pain. She is feeling much better today, endorses resolution of chest wall pain. She c/o lingering cough, however this is also improving. Advised pt con't hydration and cough suppression as needed. RTC PRN. Spent 15 mins with the patient - greater than 50% of the visit was face to face counseling patient regarding medication management and symptom prevention.  Mercer Pod, PA-C  [facility] 03/23/2018 2:50 PM

## 2018-03-23 NOTE — Therapy (Signed)
[facility] [address]  [place], [place], [address] Phone: [phone]   Fax:  [phone]  Physical Therapy Treatment  Patient Details  Name: Jennifer [name] MRN: [id number] Date of Birth: [birth year] Referring Provider: Dr. Gaynelle Arabian   Encounter Date: 03/23/2018  PT End of Session - 03/23/18 0852    Visit Number  3    Number of Visits  8    Date for PT Re-Evaluation  04/07/18    Authorization Type  [employer]na Medicare    PT Start Time  0848    PT Stop Time  0930    PT Time Calculation (min)  42 min    Activity Tolerance  Patient tolerated treatment well    Behavior During Therapy  [facility] for tasks assessed/performed       Past Medical History:  Diagnosis Date  . Arthritis    arthritis back and knees  . Blood transfusion 05/2011  . Hypertension   . Transfusion history    s/p 2012 RTKA [facility])    Past Surgical History:  Procedure Laterality Date  . ABDOMINAL HYSTERECTOMY  1996   partial  . BACK SURGERY  2003/2007   discectomy lower back '03/ '07 rods and artificial disk- Fusion done last time.  . CERVICAL FUSION  2006  . CHOLECYSTECTOMY  2006  . JOINT REPLACEMENT    . TOTAL KNEE ARTHROPLASTY  04/2011   Right  . TOTAL KNEE ARTHROPLASTY Left 08/18/2016   Procedure: LEFT TOTAL KNEE ARTHROPLASTY;  Surgeon: Gaynelle Arabian, MD;  Location: [facility];  Service: Orthopedics;  Laterality: Left;    There were no vitals filed for this visit.  Subjective Assessment - 03/23/18 0852    Subjective  Pt. noting L knee "buckled", however did not result in a fall yesterday while walking.      Pertinent History  B TKA, HTN, multiple back surgeries    Diagnostic tests  xray: negative    Patient Stated Goals  improve pain    Currently in Pain?  Yes    Pain Score  3     Pain Location  Knee    Pain Orientation  Left;Lateral    Pain Descriptors / Indicators  Aching;Sharp    Pain Type  Chronic pain    Pain Onset  More than a  month ago    Pain Frequency  Constant    Aggravating Factors   prolonged standing, prolonged walking, prolonged sitting    Multiple Pain Sites  No                       [facility] Adult PT Treatment/Exercise - 03/23/18 0904      Knee/Hip Exercises: Stretches   Quad Stretch  Left;2 reps;20 seconds    Quad Stretch Limitations  prone with strap    ITB Stretch  Left;2 reps;20 seconds    ITB Stretch Limitations  strap    Piriformis Stretch  Left;2 reps;30 seconds    Piriformis Stretch Limitations  supine with strap figure-4      Knee/Hip Exercises: Aerobic   Nustep  L3x32min      Knee/Hip Exercises: Standing   Functional Squat  10 reps;3 seconds    Functional Squat Limitations  chair behind at sink - shallow       Knee/Hip Exercises: Seated   Sit to Sand  1 set;10 reps;with UE support      Knee/Hip Exercises: Supine   Bridges with [employer]  Squeeze  15 reps;Both;Strengthening    Bridges with Clamshell  15 reps with hip abd/ER isometrics into red looped TB at knees     Straight Leg Raises  Left;Strengthening;10 reps;1 set    Straight Leg Raises Limitations  2#       Manual Therapy   Manual Therapy  Soft tissue mobilization    Manual therapy comments  sidelying with LE elevated on bolster     Soft tissue mobilization  L ITB and TFL soft tissue restriction and TTP             PT Education - 03/23/18 1220    Education provided  Yes    Education Details  HEP update     Person(s) Educated  Patient    Methods  Explanation;Demonstration;Verbal cues;Handout    Comprehension  Verbalized understanding;Returned demonstration;Verbal cues required;Need further instruction          PT Long Term Goals - 03/23/18 0904      PT LONG TERM GOAL #1   Title  patient to be independent with advanced HEP    Status  On-going      PT LONG TERM GOAL #2   Title  patient to demonstrate improved tissue pliability as demonstrated by improved flexibility and less TTP    Status   On-going      PT LONG TERM GOAL #3   Title  patient to report ability to walk/stand for >/= 1 hour without increase in pain    Status  On-going      PT LONG TERM GOAL #4   Title  patient to report pain reduction by >/= 50% for greater than 2 weeks    Status  On-going            Plan - 03/23/18 0904    Clinical Impression Statement  Khylei reporting she has had a few "knee buckling", episodes at home since last visit however able to, "catch" herself before falling.  Tolerated all VMO/quad strengthening and L lateral hip flexibility activities well today.  Added counter squat without issue today however closed supervision provided and gait belt utilized for safety today.  Pt. remains ttp in L lateral hip musculature and demonstrating "tightness" in ITB stretch position and in lateral hip musculature with palpation today.  Tenderness/tightness addressed with STM stretching today with good tolerance.  HEP updated.  Will monitor response and progress per pt. in coming visits.    PT Treatment/Interventions  ADLs/Self Care Home Management;Cryotherapy;Electrical Stimulation;Iontophoresis 4mg /ml Dexamethasone;Moist Heat;Therapeutic activities;Functional mobility training;Stair training;Gait training;Balance training;Neuromuscular re-education;Patient/family education;Manual techniques;Therapeutic exercise;Vasopneumatic Device;Taping;Dry needling;Passive range of motion    PT Next Visit Plan  progress LE strengthening/stretching, balance straining with gait belt and CGA    Consulted and Agree with Plan of Care  Patient       Patient will benefit from skilled therapeutic intervention in order to improve the following deficits and impairments:  Abnormal gait, Decreased activity tolerance, Decreased range of motion, Decreased mobility, Difficulty walking, Decreased strength, Pain  Visit Diagnosis: Chronic pain of left knee  Other symptoms and signs involving the musculoskeletal  system     Problem List Patient Active Problem List   Diagnosis Date Noted  . OA (osteoarthritis) of knee 08/18/2016    Bess Harvest, PTA 03/23/18 12:26 PM  [facility] [address]  [place], [place], [address] Phone: [phone]   Fax:  [phone]  Name: LONNETTE SHRODE MRN: [id number] Date of Birth: 06-22-1961

## 2018-03-23 NOTE — Patient Instructions (Signed)
     IF you received an x-ray today, you will receive an invoice from [facility]. Please contact [facility] at [phone] with questions or concerns regarding your invoice.   IF you received labwork today, you will receive an invoice from [facility]. Please contact [facility] at [phone] with questions or concerns regarding your invoice.   Our billing staff will not be able to assist you with questions regarding bills from these companies.  You will be contacted with the lab results as soon as they are available. The fastest way to get your results is to activate your My Chart account. Instructions are located on the last page of this paperwork. If you have not heard from us regarding the results in 2 weeks, please contact this office.     

## 2018-03-26 ENCOUNTER — Encounter: Payer: Self-pay | Admitting: Physical Therapy

## 2018-03-26 ENCOUNTER — Ambulatory Visit: Payer: Medicare HMO | Admitting: Physical Therapy

## 2018-03-26 DIAGNOSIS — G8929 Other chronic pain: Secondary | ICD-10-CM | POA: Diagnosis not present

## 2018-03-26 DIAGNOSIS — R29898 Other symptoms and signs involving the musculoskeletal system: Secondary | ICD-10-CM | POA: Diagnosis not present

## 2018-03-26 DIAGNOSIS — M25562 Pain in left knee: Secondary | ICD-10-CM | POA: Diagnosis not present

## 2018-03-26 NOTE — Therapy (Signed)
[facility] [address]  [place], [place], [address] Phone: [phone]   Fax:  [phone]  Physical Therapy Treatment  Patient Details  Name: Jennifer [name] MRN: [id number] Date of Birth: [birth year] Referring Provider: Dr. Gaynelle Arabian   Encounter Date: 03/26/2018  PT End of Session - 03/26/18 1047    Visit Number  4    Number of Visits  8    Date for PT Re-Evaluation  04/07/18    Authorization Type  Aetna Medicare    PT Start Time  0845    PT Stop Time  0928    PT Time Calculation (min)  43 min    Equipment Utilized During Treatment  Gait belt    Activity Tolerance  Patient tolerated treatment well    Behavior During Therapy  [facility] for tasks assessed/performed       Past Medical History:  Diagnosis Date  . Arthritis    arthritis back and knees  . Blood transfusion 05/2011  . Hypertension   . Transfusion history    s/p 2012 RTKA [facility])    Past Surgical History:  Procedure Laterality Date  . ABDOMINAL HYSTERECTOMY  1996   partial  . BACK SURGERY  2003/2007   discectomy lower back '03/ '07 rods and artificial disk- Fusion done last time.  . CERVICAL FUSION  2006  . CHOLECYSTECTOMY  2006  . JOINT REPLACEMENT    . TOTAL KNEE ARTHROPLASTY  04/2011   Right  . TOTAL KNEE ARTHROPLASTY Left 08/18/2016   Procedure: LEFT TOTAL KNEE ARTHROPLASTY;  Surgeon: Gaynelle Arabian, MD;  Location: [facility];  Service: Orthopedics;  Laterality: Left;    There were no vitals filed for this visit.  Subjective Assessment - 03/26/18 0847    Subjective  Patient denies falls or kness buckling.     Pertinent History  B TKA, HTN, multiple back surgeries    Diagnostic tests  xray: negative    Patient Stated Goals  improve pain    Currently in Pain?  Yes    Pain Score  4     Pain Location  Knee    Pain Orientation  Left;Lateral    Pain Descriptors / Indicators  Aching;Sharp    Pain Type  Chronic pain                        [facility] Adult PT Treatment/Exercise - 03/26/18 0001      Knee/Hip Exercises: Stretches   Control and instrumentation engineer reps;20 seconds    Quad Stretch Limitations  prone with strap    ITB Stretch  Left;2 reps;20 seconds    ITB Stretch Limitations  strap      Knee/Hip Exercises: Aerobic   Nustep  L4x73min      Knee/Hip Exercises: Standing   Terminal Knee Extension  Strengthening;Left;1 set;15 reps with chair for balance; green TB      Knee/Hip Exercises: Seated   Sit to Sand  1 set;10 reps;without UE support on foam; VCs to avoid excessive ant trunk lean      Knee/Hip Exercises: Supine   Bridges with Clamshell  15 reps red TB around knees ER; VCs to contract glutes    Knee Extension  Strengthening;Both;1 set;20 reps      Knee/Hip Exercises: Sidelying   Clams  10x  red TB      Knee/Hip Exercises: Prone   Hip Extension  Both;1 set;10 reps prone donkey kicks; advised  reduce knee flex to avoid cramp      Manual Therapy   Manual Therapy  Soft tissue mobilization;Passive ROM    Manual therapy comments  sidelying with LE elevated on bolster     Soft tissue mobilization  L ITB and TFL soft tissue restriction and TTP; L lateral gastroc    Passive ROM  L passive ober stretch with PT OP; 2x20 sec                  PT Long Term Goals - 03/23/18 0904      PT LONG TERM GOAL #1   Title  patient to be independent with advanced HEP    Status  On-going      PT LONG TERM GOAL #2   Title  patient to demonstrate improved tissue pliability as demonstrated by improved flexibility and less TTP    Status  On-going      PT LONG TERM GOAL #3   Title  patient to report ability to walk/stand for >/= 1 hour without increase in pain    Status  On-going      PT LONG TERM GOAL #4   Title  patient to report pain reduction by >/= 50% for greater than 2 weeks    Status  On-going            Plan - 03/26/18 1048    Clinical Impression Statement  Patient  arrived to session with no new complaints and no recent falls or knee buckling. Patient still with soft tissue restriction in L ITB and TFL with tenderness to palpation- tolerated STM to these areas. Able to tolerate hip strengthening this date with no c/o pain. Performed STS on foam with VCs to decreased anterior trunk lean; mild instability on foam which improved with increased reps. Educated patient on benefits of using SPC when ambulating in crowded places and uneven surfaces in order to decrease anxiety and risk of falls. Patient agreeable. Encouraged to bring her cane in at next session to work on gait training.     PT Treatment/Interventions  ADLs/Self Care Home Management;Cryotherapy;Electrical Stimulation;Iontophoresis 4mg /ml Dexamethasone;Moist Heat;Therapeutic activities;Functional mobility training;Stair training;Gait training;Balance training;Neuromuscular re-education;Patient/family education;Manual techniques;Therapeutic exercise;Vasopneumatic Device;Taping;Dry needling;Passive range of motion    PT Next Visit Plan  gait training with SPC    Consulted and Agree with Plan of Care  Patient       Patient will benefit from skilled therapeutic intervention in order to improve the following deficits and impairments:  Abnormal gait, Decreased activity tolerance, Decreased range of motion, Decreased mobility, Difficulty walking, Decreased strength, Pain  Visit Diagnosis: Chronic pain of left knee  Other symptoms and signs involving the musculoskeletal system     Problem List Patient Active Problem List   Diagnosis Date Noted  . OA (osteoarthritis) of knee 08/18/2016    Janene Harvey, PT, DPT 03/26/18 10:49 AM   [facility] [address]  [place], [place], [address] Phone: [phone]   Fax:  [phone]  Name: Jennifer [name] MRN: [id number] Date of Birth: [birth year]

## 2018-03-31 ENCOUNTER — Ambulatory Visit: Payer: Medicare HMO

## 2018-03-31 DIAGNOSIS — M25562 Pain in left knee: Principal | ICD-10-CM

## 2018-03-31 DIAGNOSIS — M7918 Myalgia, other site: Secondary | ICD-10-CM | POA: Diagnosis not present

## 2018-03-31 DIAGNOSIS — R29898 Other symptoms and signs involving the musculoskeletal system: Secondary | ICD-10-CM | POA: Diagnosis not present

## 2018-03-31 DIAGNOSIS — G8929 Other chronic pain: Secondary | ICD-10-CM

## 2018-03-31 DIAGNOSIS — M47816 Spondylosis without myelopathy or radiculopathy, lumbar region: Secondary | ICD-10-CM | POA: Diagnosis not present

## 2018-03-31 NOTE — Therapy (Signed)
[facility] [address]  [place], [place], [address] Phone: [phone]   Fax:  [phone]  Physical Therapy Treatment  Patient Details  Name: Jennifer [name] MRN: [id number] Date of Birth: 21-Aug-1961 Referring Provider: Dr. Gaynelle Arabian   Encounter Date: 03/31/2018  PT End of Session - 03/31/18 0908    Visit Number  5    Number of Visits  17    Date for PT Re-Evaluation  05/12/18    Authorization Type  [employer] Medicare    PT Start Time  [phone]    PT Stop Time  0928    PT Time Calculation (min)  42 min    Equipment Utilized During Treatment  Gait belt    Activity Tolerance  Patient tolerated treatment well    Behavior During Therapy  [facility] for tasks assessed/performed       Past Medical History:  Diagnosis Date  . Arthritis    arthritis back and knees  . Blood transfusion 05/2011  . Hypertension   . Transfusion history    s/p 2012 RTKA [facility])    Past Surgical History:  Procedure Laterality Date  . ABDOMINAL HYSTERECTOMY  1996   partial  . BACK SURGERY  2003/2007   discectomy lower back '03/ '07 rods and artificial disk- Fusion done last time.  . CERVICAL FUSION  2006  . CHOLECYSTECTOMY  2006  . JOINT REPLACEMENT    . TOTAL KNEE ARTHROPLASTY  04/2011   Right  . TOTAL KNEE ARTHROPLASTY Left 08/18/2016   Procedure: LEFT TOTAL KNEE ARTHROPLASTY;  Surgeon: Gaynelle Arabian, MD;  Location: [facility];  Service: Orthopedics;  Laterality: Left;    There were no vitals filed for this visit.  Subjective Assessment - 03/31/18 0850    Subjective  Denies falls however reports some L knee buckling daily still at this point 3-4/day without resulting in falls.      Pertinent History  B TKA, HTN, multiple back surgeries    How long can you sit comfortably?  30 min     How long can you stand comfortably?  30 min     How long can you walk comfortably?  1 hour    Diagnostic tests  xray: negative    Patient Stated Goals   improve pain    Currently in Pain?  Yes    Pain Score  2     Pain Location  Knee    Pain Orientation  Left;Lateral    Pain Descriptors / Indicators  Aching;Sharp    Pain Type  Chronic pain    Pain Onset  More than a month ago    Pain Frequency  Constant    Aggravating Factors   Prolonged standing, prolonged walking     Pain Relieving Factors  voltaren gel    Multiple Pain Sites  No         [facility] PT Assessment - 03/31/18 0856      AROM   AROM Assessment Site  Knee    Right/Left Knee  Left    Left Knee Extension  -1    Left Knee Flexion  116      Strength   Strength Assessment Site  Knee;Hip    Right/Left Hip  Right;Left    Right Hip Flexion  4+/5    Left Hip Flexion  4+/5    Right/Left Knee  Right;Left    Right Knee Flexion  4+/5    Right Knee Extension  4+/5    Left Knee Flexion  4+/5    Left Knee Extension  4+/5                   [facility] Adult PT Treatment/Exercise - 03/31/18 0909      Ambulation/Gait   Ambulation/Gait  Yes    Ambulation/Gait Assistance  5: Supervision    Ambulation/Gait Assistance Details  Cues for proper sequencing and increased step length     Ambulation Distance (Feet)  200 Feet    Stairs  Yes    Stairs Assistance  4: Min guard gait belt    Stair Management Technique  One rail Right;Alternating pattern;Step to pattern    Number of Stairs  14    Height of Stairs  8    Gait Comments  Pt. able to ascend/descend stairs x 14 with alternating and step-to pattern with 1 rail use however did have B knee pain with this and poor eccentric control B; required cueing to slow pacing       Knee/Hip Exercises: Aerobic   Nustep  L4x61mn      Knee/Hip Exercises: Supine   Bridges  10 reps;Strengthening;Both with toes off table     Bridges with BDiona FoleySqueeze  15 reps;Both;Strengthening 3" hold     Straight Leg Raises  Left;Strengthening;1 set;15 reps    Straight Leg Raises Limitations  2#     Straight Leg Raise with External Rotation   Left;Strengthening;10 reps    Straight Leg Raise with External Rotation Limitations  2#             PT Education - 03/31/18 1114    Education provided  Yes    Education Details  HEP update     Person(s) Educated  Patient    Methods  Explanation;Demonstration;Verbal cues;Handout    Comprehension  Verbalized understanding;Returned demonstration;Verbal cues required;Need further instruction          PT Long Term Goals - 03/31/18 1236      PT LONG TERM GOAL #1   Title  patient to be independent with advanced HEP    Status  Partially Met met for current HEP     Target Date  05/12/18      PT LONG TERM GOAL #2   Title  patient to demonstrate improved tissue pliability as demonstrated by improved flexibility and less TTP    Status  On-going    Target Date  05/12/18      PT LONG TERM GOAL #3   Title  patient to report ability to walk/stand for >/= 1 hour without increase in pain    Status  Partially Met Pt. able to stand for 30 min and walk for 1 hour before increase in pain     Target Date  05/12/18      PT LONG TERM GOAL #4   Title  patient to report pain reduction by >/= 50% for greater than 2 weeks    Status  Achieved Pt. noting 50% improvement for 2 weeks       PT LONG TERM GOAL #5   Title  Patient to demonstrate step-through stair climbing pattern with 1 handrail with good eccentric control and no evidence of knee buckling.    Time  6    Period  Weeks    Status  New    Target Date  05/12/18      Additional Long Term Goals   Additional Long Term Goals  Yes  PT LONG TERM GOAL #6   Title  Patient to demonstrate bending forward to pick up object from ground with <=1/10 pain in B knees and safe body mechanics.     Time  6    Period  Weeks    Status  New    Target Date  05/12/18            Plan - 03/31/18 1115    Clinical Impression Statement  Maryfrances has made good progress with therapy.  Notes ~ 50% improvement in pain since starting therapy.  Able to  demo good improvement in L knee ROM with -1-116 dg today.  Able to demo improvement in LE strength today now grossly 4+/5.  Feels she is improving LE strength since starting therapy however does still note occasional "buckling" at R LE 3-4x/day and L knee pain with climbing stairs.  Pt. verbalized today she wishes to improve her comfort with stair navigation and bending down to pick up objects from floor as she has some difficulty and pain with these tasks.  Pt. with difficulty and B knee pain navigating stairs today with step-through pattern.  Requiring supervision with bending down to floor today and unable to perform without kneeling on B knees on pad.  Pt. strongly encouraged to avoid kneeling on B knees today as she has had previous TKA and reports she had been done this at home previously.  Supervising PT aware of pt. goal status and with plans to extend pt. plan of care following today's visit.      PT Treatment/Interventions  ADLs/Self Care Home Management;Cryotherapy;Electrical Stimulation;Iontophoresis '4mg'$ /ml Dexamethasone;Moist Heat;Therapeutic activities;Functional mobility training;Stair training;Gait training;Balance training;Neuromuscular re-education;Patient/family education;Manual techniques;Therapeutic exercise;Vasopneumatic Device;Taping;Dry needling;Passive range of motion    PT Next Visit Plan  Continue gait training with SPC    Consulted and Agree with Plan of Care  Patient       Patient will benefit from skilled therapeutic intervention in order to improve the following deficits and impairments:  Abnormal gait, Decreased activity tolerance, Decreased range of motion, Decreased mobility, Difficulty walking, Decreased strength, Pain  Visit Diagnosis: Chronic pain of left knee  Other symptoms and signs involving the musculoskeletal system     Problem List Patient Active Problem List   Diagnosis Date Noted  . OA (osteoarthritis) of knee 08/18/2016    Bess Harvest,  PTA 03/31/18 12:41 PM  [facility] [address]  [place], [place], [address] Phone: [phone]   Fax:  [phone]  Name: XOEY WARMOTH MRN: [id number] Date of Birth: Jul 03, 1961  Additional goal added to address patient's difficulty with stair climbing and bending over to pick up object from ground. Patient has been progressing well with PT thus far, but still with concerns about intermittent knee buckling. Would benefit from continued skilled PT services 2x/week for 6 weeks to address remaining and additional goals.  Janene Harvey, PT, DPT 03/31/18 12:43 PM

## 2018-04-01 ENCOUNTER — Ambulatory Visit: Payer: Medicare HMO

## 2018-04-07 ENCOUNTER — Ambulatory Visit: Payer: Medicare HMO | Admitting: Physical Therapy

## 2018-04-07 DIAGNOSIS — G8929 Other chronic pain: Secondary | ICD-10-CM | POA: Diagnosis not present

## 2018-04-07 DIAGNOSIS — M25562 Pain in left knee: Secondary | ICD-10-CM | POA: Diagnosis not present

## 2018-04-07 DIAGNOSIS — R29898 Other symptoms and signs involving the musculoskeletal system: Secondary | ICD-10-CM

## 2018-04-07 NOTE — Therapy (Signed)
[facility] [address]  [place], [place], [address] Phone: [phone]   Fax:  [phone]  Physical Therapy Treatment  Patient Details  Name: Jennifer [name] MRN: [id number] Date of Birth: 23-May-1961 Referring Provider: Dr. Gaynelle Arabian   Encounter Date: 04/07/2018  PT End of Session - 04/07/18 1443    Visit Number  6    Number of Visits  17    Date for PT Re-Evaluation  05/12/18    Authorization Type  A[employer]na Medicare    PT Start Time  1749    PT Stop Time  1439    PT Time Calculation (min)  51 min    Equipment Utilized During Treatment  Gait belt    Activity Tolerance  Patient tolerated treatment well    Behavior During Therapy  [facility] for tasks assessed/performed       Past Medical History:  Diagnosis Date  . Arthritis    arthritis back and knees  . Blood transfusion 05/2011  . Hypertension   . Transfusion history    s/p 2012 RTKA [facility])    Past Surgical History:  Procedure Laterality Date  . ABDOMINAL HYSTERECTOMY  1996   partial  . BACK SURGERY  2003/2007   discectomy lower back '03/ '07 rods and artificial disk- Fusion done last time.  . CERVICAL FUSION  2006  . CHOLECYSTECTOMY  2006  . JOINT REPLACEMENT    . TOTAL KNEE ARTHROPLASTY  04/2011   Right  . TOTAL KNEE ARTHROPLASTY Left 08/18/2016   Procedure: LEFT TOTAL KNEE ARTHROPLASTY;  Surgeon: Gaynelle Arabian, MD;  Location: [facility];  Service: Orthopedics;  Laterality: Left;    There were no vitals filed for this visit.  Subjective Assessment - 04/07/18 1351    Subjective  Reports she has been compliant with exercises. Reports 50% improvement in knee pain. Today is the first day that she hasn't had any pain. Reports she has a quad cane-PTA asked her to get a [facility] and she is working on that. Denies recent falls and reports knee buckling has become less frequent.    Pertinent History  B TKA, HTN, multiple back surgeries    Diagnostic tests  xray:  negative    Patient Stated Goals  improve pain    Currently in Pain?  No/denies                       [facility] Adult PT Treatment/Exercise - 04/07/18 0001      Ambulation/Gait   Ambulation/Gait  Yes    Ambulation/Gait Assistance  4: Min guard    Ambulation Distance (Feet)  200 Feet    Assistive device  Straight cane    Gait Pattern  Step-through pattern;Decreased stance time - left;Lateral trunk lean to right    Ambulation Surface  Level;Indoor    Gait velocity  decreased    Stairs  Yes    Stairs Assistance  4: Min guard    Stair Management Technique  One rail Right;Step to pattern with SPC    Number of Stairs  13    Height of Stairs  8    Door Management  6: Modified independent (Device/Increase time)    Gait Comments  Able to perform stair climbing up/down stairs with SPC and step-to pattern; educated patient on "up with good, down with bad" climbing pattern with future goal to perform step-through climbing. Patient reporting no pain with stairs this date  Therapeutic Activites    Therapeutic Activities  Lifting    Lifting  Hip hinge, wide squat, staggered squat techniques to lift bean bag and 5# dumbell off ground x 8 minutes patient without c/o pain      Knee/Hip Exercises: Stretches   Passive Hamstring Stretch  Left;2 reps;20 seconds;Limitations    Passive Hamstring Stretch Limitations  strap    ITB Stretch  Left;2 reps;20 seconds    ITB Stretch Limitations  strap; TCs to push into a stretch      Knee/Hip Exercises: Aerobic   Nustep  L4x6 min      Knee/Hip Exercises: Seated   Sit to Sand  1 set;10 reps;without UE support on foam; VCs to increase ant trunk lean and foot position      Knee/Hip Exercises: Supine   Bridges with Clamshell  Strengthening;Both;1 set;15 reps;Limitations red TB around knees    Other Supine Knee/Hip Exercises  resited hip flexion with red TB around ankles and B LEs on pball; 10x each      Knee/Hip Exercises: Sidelying   Clams   10x each side red TB      Manual Therapy   Manual Therapy  Soft tissue mobilization    Manual therapy comments  SL R    Soft tissue mobilization  L ITB and TFL soft tissue restriction and TTP             PT Education - 04/07/18 1443    Education provided  Yes    Education Details  HEP update    Person(s) Educated  Patient    Methods  Explanation;Demonstration;Tactile cues;Verbal cues;Handout    Comprehension  Returned demonstration;Verbalized understanding          PT Long Term Goals - 04/07/18 1354      PT LONG TERM GOAL #1   Title  patient to be independent with advanced HEP    Status  Partially Met met for current HEP       PT LONG TERM GOAL #2   Title  patient to demonstrate improved tissue pliability as demonstrated by improved flexibility and less TTP    Status  Partially Met decreased soft tissue restriction in L TFL and ITB, remaining restriction in L glute med      PT LONG TERM GOAL #3   Title  patient to report ability to walk/stand for >/= 1 hour without increase in pain    Status  Achieved Pt. able to stand and walk for 1 hour before increase in pain       PT LONG TERM GOAL #4   Title  patient to report pain reduction by >/= 50% for greater than 2 weeks    Status  Achieved Pt. noting 50% improvement for 2 weeks       PT LONG TERM GOAL #5   Title  Patient to demonstrate step-through stair climbing pattern with 1 handrail with good eccentric control and no evidence of knee buckling.    Time  6    Period  Weeks    Status  Partially Met Patient performed step-to stair climbing with 1 handrail and SPC at this time without c/o pain or evidence of buckling      PT LONG TERM GOAL #6   Title  Patient to demonstrate bending forward to pick up object from ground with <=1/10 pain in B knees and safe body mechanics.     Time  6    Period  Weeks    Status  Achieved no c/o pain after showing safe body mechanics for lifting            Plan - 04/07/18 1444     Clinical Impression Statement  Patient arrived to session with report of 50% improvement in knee pain since initial eval. Reports stairs are still a problem for her but today was the first day that she didn't have knee pain. Denies recent falls and reports knee buckling has become less frequent. Updated goals this date- patient reports consistency with HEP, decrease in palpable soft tissue restriction in L TFL and ITB- still remaining glute med soft tissue restriction, met walking/standing tolerance goals this date, and able to perform lifting 5# object from floor this date with good body mechanics and 0/10 pain. Patient with good response to STM this date. Performed gait training with [facility] and educated patient on [facility] sequencing and cane management- good carryover. Able to perform stair climbing up/down stairs with SPC and step-to pattern; educated patient on "up with good, down with bad" climbing pattern with future goal to perform step-through climbing. Patient reporting no pain with stairs this date. Educated patient on several lifting techniques with good body mechanics to lift bean bag and 5# weight- patient with good mechanics and 0/10 pain. Update HEP with hamstring stretch this date- patient reported understanding and received handout.     PT Treatment/Interventions  ADLs/Self Care Home Management;Cryotherapy;Electrical Stimulation;Iontophoresis '4mg'$ /ml Dexamethasone;Moist Heat;Therapeutic activities;Functional mobility training;Stair training;Gait training;Balance training;Neuromuscular re-education;Patient/family education;Manual techniques;Therapeutic exercise;Vasopneumatic Device;Taping;Dry needling;Passive range of motion    PT Next Visit Plan  Continue gait training with SPC    Consulted and Agree with Plan of Care  Patient       Patient will benefit from skilled therapeutic intervention in order to improve the following deficits and impairments:  Abnormal gait, Decreased activity tolerance,  Decreased range of motion, Decreased mobility, Difficulty walking, Decreased strength, Pain  Visit Diagnosis: Chronic pain of left knee  Other symptoms and signs involving the musculoskeletal system     Problem List Patient Active Problem List   Diagnosis Date Noted  . OA (osteoarthritis) of knee 08/18/2016     Janene Harvey, PT, DPT 04/07/18 3:47 PM   [facility] [address]  [place], [place], [address] Phone: [phone]   Fax:  [phone]  Name: Jennifer [name] MRN: [id number] Date of Birth: 08/20/1961

## 2018-04-08 ENCOUNTER — Ambulatory Visit: Payer: Medicare HMO | Admitting: Physical Therapy

## 2018-04-08 DIAGNOSIS — M25562 Pain in left knee: Secondary | ICD-10-CM | POA: Insufficient documentation

## 2018-04-08 DIAGNOSIS — R0789 Other chest pain: Secondary | ICD-10-CM | POA: Diagnosis not present

## 2018-04-08 DIAGNOSIS — Z0189 Encounter for other specified special examinations: Secondary | ICD-10-CM | POA: Diagnosis not present

## 2018-04-08 DIAGNOSIS — Z96652 Presence of left artificial knee joint: Secondary | ICD-10-CM | POA: Diagnosis not present

## 2018-04-08 DIAGNOSIS — I10 Essential (primary) hypertension: Secondary | ICD-10-CM | POA: Diagnosis not present

## 2018-04-20 ENCOUNTER — Encounter: Payer: Medicare HMO | Admitting: Physical Therapy

## 2018-04-21 ENCOUNTER — Ambulatory Visit: Payer: Medicare HMO | Attending: Orthopedic Surgery

## 2018-04-21 DIAGNOSIS — G8929 Other chronic pain: Secondary | ICD-10-CM

## 2018-04-21 DIAGNOSIS — M25562 Pain in left knee: Secondary | ICD-10-CM | POA: Insufficient documentation

## 2018-04-21 DIAGNOSIS — R29898 Other symptoms and signs involving the musculoskeletal system: Secondary | ICD-10-CM | POA: Insufficient documentation

## 2018-04-21 NOTE — Therapy (Signed)
[facility] [address]  [place], [place], [address] Phone: [phone]   Fax:  [phone]  Physical Therapy Treatment  Patient Details  Name: Jennifer [name] MRN: [id number] Date of Birth: 1961-04-24 Referring Provider: Dr. Gaynelle Arabian   Encounter Date: 04/21/2018  PT End of Session - 04/21/18 0801    Visit Number  7    Number of Visits  17    Date for PT Re-Evaluation  05/12/18    Authorization Type  [employer]etna Medicare    PT Start Time  0758    PT Stop Time  0847    PT Time Calculation (min)  49 min    Equipment Utilized During Treatment  --    Activity Tolerance  Patient tolerated treatment well    Behavior During Therapy  [facility] for tasks assessed/performed       Past Medical History:  Diagnosis Date  . Arthritis    arthritis back and knees  . Blood transfusion 05/2011  . Hypertension   . Transfusion history    s/p 2012 RTKA [facility])    Past Surgical History:  Procedure Laterality Date  . ABDOMINAL HYSTERECTOMY  1996   partial  . BACK SURGERY  2003/2007   discectomy lower back '03/ '07 rods and artificial disk- Fusion done last time.  . CERVICAL FUSION  2006  . CHOLECYSTECTOMY  2006  . JOINT REPLACEMENT    . TOTAL KNEE ARTHROPLASTY  04/2011   Right  . TOTAL KNEE ARTHROPLASTY Left 08/18/2016   Procedure: LEFT TOTAL KNEE ARTHROPLASTY;  Surgeon: Gaynelle Arabian, MD;  Location: [facility];  Service: Orthopedics;  Laterality: Left;    There were no vitals filed for this visit.  Subjective Assessment - 04/21/18 0800    Subjective  Reports she has not been walking without cane and feeling well with no knee buckling.    Pertinent History  B TKA, HTN, multiple back surgeries    How long can you sit comfortably?  1 hrs     How long can you stand comfortably?  1 hrs     How long can you walk comfortably?  1 hour    Diagnostic tests  xray: negative    Patient Stated Goals  improve pain    Currently in Pain?   No/denies    Pain Score  0-No pain    Multiple Pain Sites  No                       [facility] Adult PT Treatment/Exercise - 04/21/18 0815      Self-Care   Self-Care  Other Self-Care Comments    Other Self-Care Comments   Reviewed comprehensive home program with update to check for understanding       Knee/Hip Exercises: Stretches   Passive Hamstring Stretch  Left;Limitations;30 seconds;1 rep Reviewed for HEP due to report of cramping with this stretch    Passive Hamstring Stretch Limitations  strap    Quad Stretch  Left;1 rep;30 seconds Reviewed for HEP due to report of cramping with this stretch    Quad Stretch Limitations  prone with strap      Knee/Hip Exercises: Aerobic   Recumbent Bike  Lvl 1, 7 min       Knee/Hip Exercises: Machines for Strengthening   Cybex Leg Press  B LE's: 20# x 15 reps       Knee/Hip Exercises: Standing   Heel Raises  Both;15 reps  Heel Raises Limitations  counter     Hip Flexion  Right;Left;10 reps;Knee straight    Hip Flexion Limitations  yellow looped TB at ankles     Terminal Knee Extension  Left;1 set;15 reps;Strengthening    Theraband Level (Terminal Knee Extension)  Level 4 (Blue)    Hip Abduction  Right;Left;10 reps;Knee straight    Abduction Limitations  yellow TB at ankles     Hip Extension  Right;Left;10 reps;Knee straight    Extension Limitations  yellow TB at ankles     Functional Squat  15 reps;3 seconds minor cueing required for proper wt. shift     Functional Squat Limitations  TRX - to chair behind pt.     Wall Squat  15 reps;3 seconds    Wall Squat Limitations  ball squeeze at knees              PT Education - 04/21/18 0851    Education provided  Yes    Education Details  HEP update for post-therapy ongoing program: pt. instructed with handout to perform prone quad stretch, hamstring stretch with strap, wall sit, bridge adduction, 3-way hip kicker with yellow looped TB at ankles    Person(s) Educated   Patient    Methods  Explanation;Demonstration;Verbal cues;Handout    Comprehension  Verbalized understanding;Returned demonstration;Verbal cues required;Need further instruction          PT Long Term Goals - 04/21/18 0836      PT LONG TERM GOAL #1   Title  patient to be independent with advanced HEP    Status  Partially Met met for current HEP       PT LONG TERM GOAL #2   Title  patient to demonstrate improved tissue pliability as demonstrated by improved flexibility and less TTP    Status  Partially Met decreased soft tissue restriction in L TFL and ITB, remaining restriction in L glute med      PT LONG TERM GOAL #3   Title  patient to report ability to walk/stand for >/= 1 hour without increase in pain    Status  Achieved Pt. able to stand and walk for 1 hour before increase in pain       PT LONG TERM GOAL #4   Title  patient to report pain reduction by >/= 50% for greater than 2 weeks    Status  Achieved Pt. noting 50% improvement for 2 weeks       PT LONG TERM GOAL #5   Title  Patient to demonstrate step-through stair climbing pattern with 1 handrail with good eccentric control and no evidence of knee buckling.    Time  6    Period  Weeks    Status  Achieved      PT LONG TERM GOAL #6   Title  Patient to demonstrate bending forward to pick up object from ground with <=1/10 pain in B knees and safe body mechanics.     Time  6    Period  Weeks    Status  Achieved            Plan - 04/21/18 0801    Clinical Impression Statement  Mertice doing well.  Notes no recent buckling at L knee and feeling less need for cane.  Able to demo reciprocal stair navigation today with one rail and good stability without AD meeting this goal.  HEP updated with additional strengthening activities.  Pt. noting she feels she is feeling more comfortable  regarding transition to home program.  Supervising PT made aware of pt. status and pt. verbalizing comfort transitioning to home program  following next visit and with plans to start attending [employer] for water aerobics again.  Will plan to check tolerance for updated HEP and perform final goals testing at upcoming visit and transition to home program per pt. tolerance.    PT Treatment/Interventions  ADLs/Self Care Home Management;Cryotherapy;Electrical Stimulation;Iontophoresis '4mg'$ /ml Dexamethasone;Moist Heat;Therapeutic activities;Functional mobility training;Stair training;Gait training;Balance training;Neuromuscular re-education;Patient/family education;Manual techniques;Therapeutic exercise;Vasopneumatic Device;Taping;Dry needling;Passive range of motion    PT Next Visit Plan  Final HEP review and goals testing     Consulted and Agree with Plan of Care  Patient       Patient will benefit from skilled therapeutic intervention in order to improve the following deficits and impairments:  Abnormal gait, Decreased activity tolerance, Decreased range of motion, Decreased mobility, Difficulty walking, Decreased strength, Pain  Visit Diagnosis: Chronic pain of left knee  Other symptoms and signs involving the musculoskeletal system     Problem List Patient Active Problem List   Diagnosis Date Noted  . OA (osteoarthritis) of knee 08/18/2016    Bess Harvest, PTA 04/21/18 9:07 AM  [facility] [address]  [place], [place], [address] Phone: [phone]   Fax:  [phone]  Name: HAELEY FORDHAM MRN: [id number] Date of Birth: [birth year]

## 2018-04-23 ENCOUNTER — Encounter: Payer: Medicare HMO | Admitting: Physical Therapy

## 2018-04-28 ENCOUNTER — Ambulatory Visit: Payer: Medicare HMO

## 2018-04-28 DIAGNOSIS — R29898 Other symptoms and signs involving the musculoskeletal system: Secondary | ICD-10-CM | POA: Diagnosis not present

## 2018-04-28 DIAGNOSIS — G8929 Other chronic pain: Secondary | ICD-10-CM | POA: Diagnosis not present

## 2018-04-28 DIAGNOSIS — M25562 Pain in left knee: Secondary | ICD-10-CM | POA: Diagnosis not present

## 2018-04-28 NOTE — Therapy (Addendum)
[facility] [address]  [place], [place], [address] Phone: [phone]   Fax:  [phone]  Physical Therapy Treatment  Patient Details  Name: Jennifer [name] MRN: [id number] Date of Birth: 1961/02/06 Referring Provider: Dr. Gaynelle Arabian   Encounter Date: 04/28/2018  PT End of Session - 04/28/18 0844    Visit Number  8    Number of Visits  17    Date for PT Re-Evaluation  05/12/18    Authorization Type  [employer]    PT Start Time  4827    PT Stop Time  0923    PT Time Calculation (min)  39 min    Activity Tolerance  Patient tolerated treatment well    Behavior During Therapy  [facility] for tasks assessed/performed       Past Medical History:  Diagnosis Date  . Arthritis    arthritis back and knees  . Blood transfusion 05/2011  . Hypertension   . Transfusion history    s/p 2012 [facility])    Past Surgical History:  Procedure Laterality Date  . ABDOMINAL HYSTERECTOMY  1996   partial  . BACK SURGERY  2003/2007   discectomy lower back '03/ '07 rods and artificial disk- Fusion done last time.  . CERVICAL FUSION  2006  . CHOLECYSTECTOMY  2006  . JOINT REPLACEMENT    . TOTAL KNEE ARTHROPLASTY  04/2011   Right  . TOTAL KNEE ARTHROPLASTY Left 08/18/2016   Procedure: LEFT TOTAL KNEE ARTHROPLASTY;  Surgeon: Gaynelle Arabian, MD;  Location: [facility];  Service: Orthopedics;  Laterality: Left;    There were no vitals filed for this visit.  Subjective Assessment - 04/28/18 0844    Subjective  Pt. doing well today and notes she would feel most comfortable transitioning to home program with 30-day hold from therapy after today's visit.  Walking regularly with friends a couple times weekly.      Pertinent History  B TKA, HTN, multiple back surgeries    Diagnostic tests  xray: negative    Patient Stated Goals  improve pain    Currently in Pain?  No/denies    Pain Score  0-No pain    Multiple Pain Sites  No          [facility] PT Assessment - 04/28/18 0001      Observation/Other Assessments   Focus on Therapeutic Outcomes (FOTO)   73% (27% limitation)                   [facility] Adult PT Treatment/Exercise - 04/28/18 0850      Self-Care   Self-Care  Other Self-Care Comments    Other Self-Care Comments   Final review of home program and discussion of water aerobics and walking with friend for ongoing fitness       Knee/Hip Exercises: Aerobic   Recumbent Bike  Lvl 1, 7 min       Knee/Hip Exercises: Machines for Strengthening   Cybex Knee Extension  B LE's 20# x 10 reps     Cybex Knee Flexion  B LE's 25# x 15 reps       Knee/Hip Exercises: Standing   Hip Flexion  Right;Left;Knee straight x 12 reps     Hip Flexion Limitations  red TB at ankles     Hip ADduction  Right;Left;Strengthening x 12 reps     Hip ADduction Limitations  red TB at ankles     Hip Abduction  Right;Left;Knee straight 12 reps     Abduction Limitations  red TB at ankles     Hip Extension  Right;Left;Knee straight x 12 reps     Extension Limitations  red TB at ankles      Knee/Hip Exercises: Supine   Other Supine Knee/Hip Exercises  Straight leg bridge 5" x 10 reps                   PT Long Term Goals - 04/28/18 0910      PT LONG TERM GOAL #1   Title  patient to be independent with advanced HEP    Status  Achieved      PT LONG TERM GOAL #2   Title  patient to demonstrate improved tissue pliability as demonstrated by improved flexibility and less TTP    Status  Achieved      PT LONG TERM GOAL #3   Title  patient to report ability to walk/stand for >/= 1 hour without increase in pain    Status  Achieved Pt. able to stand and walk for 1 hour before increase in pain       PT LONG TERM GOAL #4   Title  patient to report pain reduction by >/= 50% for greater than 2 weeks    Status  Achieved Pt. noting 100% improvement in knee pain for 2 weeks       PT LONG TERM GOAL #5   Title  Patient to  demonstrate step-through stair climbing pattern with 1 handrail with good eccentric control and no evidence of knee buckling.    Time  6    Period  Weeks    Status  Achieved      PT LONG TERM GOAL #6   Title  Patient to demonstrate bending forward to pick up object from ground with <=1/10 pain in B knees and safe body mechanics.     Time  6    Period  Weeks    Status  Achieved            Plan - 04/28/18 0844    Clinical Impression Statement  Pt. doing well today.  Tolerated all proximal hip and LE strengthening activities well today without pain.  Has not had any recent buckling at L knee.  Able to achieve all therapy goals at this point.  Wishes to go on 30-day hold from therapy with supervising PT approving this plan.  Performed final review of home program today with pt. verbalizing understanding of this.  Pt. now on 30-day hold from therapy.      PT Treatment/Interventions  ADLs/Self Care Home Management;Cryotherapy;Electrical Stimulation;Iontophoresis 44m/ml Dexamethasone;Moist Heat;Therapeutic activities;Functional mobility training;Stair training;Gait training;Balance training;Neuromuscular re-education;Patient/family education;Manual techniques;Therapeutic exercise;Vasopneumatic Device;Taping;Dry needling;Passive range of motion    PT Next Visit Plan  30-day hold     Consulted and Agree with Plan of Care  Patient       Patient will benefit from skilled therapeutic intervention in order to improve the following deficits and impairments:  Abnormal gait, Decreased activity tolerance, Decreased range of motion, Decreased mobility, Difficulty walking, Decreased strength, Pain  Visit Diagnosis: Chronic pain of left knee  Other symptoms and signs involving the musculoskeletal system     Problem List Patient Active Problem List   Diagnosis Date Noted  . OA (osteoarthritis) of knee 08/18/2016    MBess Harvest PTA 04/28/18 10:05 AM   [facility] [address] [place]  [place], [place], [address] Phone: [phone]   Fax:  [phone]  Name: JULIEN BERRYMAN MRN: [id number] Date of Birth: 1961/10/08  PHYSICAL THERAPY DISCHARGE SUMMARY  Visits from Start of Care: 8  Current functional level related to goals / functional outcomes: See above; patient did not return after 30 day hold d/t being pleased with progress    Remaining deficits: See above   Education / Equipment: HEP  Plan: Patient agrees to discharge.  Patient goals were met. Patient is being discharged due to being pleased with the current functional level.  ?????     Janene Harvey, PT, DPT  06/01/18 9:05 AM

## 2018-05-07 ENCOUNTER — Encounter: Payer: Medicare HMO | Admitting: Physical Therapy

## 2018-05-11 DIAGNOSIS — M47816 Spondylosis without myelopathy or radiculopathy, lumbar region: Secondary | ICD-10-CM | POA: Diagnosis not present

## 2018-05-14 ENCOUNTER — Encounter: Payer: Medicare HMO | Admitting: Physical Therapy

## 2018-05-21 ENCOUNTER — Encounter: Payer: Medicare HMO | Admitting: Physical Therapy

## 2018-05-28 ENCOUNTER — Encounter: Payer: Medicare HMO | Admitting: Physical Therapy

## 2018-06-04 ENCOUNTER — Encounter: Payer: Medicare HMO | Admitting: Physical Therapy

## 2018-06-08 DIAGNOSIS — M5106 Intervertebral disc disorders with myelopathy, lumbar region: Secondary | ICD-10-CM | POA: Diagnosis not present

## 2018-06-08 DIAGNOSIS — M7918 Myalgia, other site: Secondary | ICD-10-CM | POA: Diagnosis not present

## 2018-06-08 DIAGNOSIS — M47816 Spondylosis without myelopathy or radiculopathy, lumbar region: Secondary | ICD-10-CM | POA: Diagnosis not present

## 2018-06-11 ENCOUNTER — Encounter: Payer: Medicare HMO | Admitting: Physical Therapy

## 2018-07-21 ENCOUNTER — Other Ambulatory Visit: Payer: Self-pay | Admitting: Internal Medicine

## 2018-07-21 DIAGNOSIS — Z1231 Encounter for screening mammogram for malignant neoplasm of breast: Secondary | ICD-10-CM

## 2018-07-22 DIAGNOSIS — H524 Presbyopia: Secondary | ICD-10-CM | POA: Diagnosis not present

## 2018-07-29 DIAGNOSIS — R110 Nausea: Secondary | ICD-10-CM | POA: Diagnosis not present

## 2018-07-29 DIAGNOSIS — Z1331 Encounter for screening for depression: Secondary | ICD-10-CM | POA: Diagnosis not present

## 2018-07-29 DIAGNOSIS — Z79899 Other long term (current) drug therapy: Secondary | ICD-10-CM | POA: Diagnosis not present

## 2018-07-29 DIAGNOSIS — K591 Functional diarrhea: Secondary | ICD-10-CM | POA: Diagnosis not present

## 2018-07-29 DIAGNOSIS — E041 Nontoxic single thyroid nodule: Secondary | ICD-10-CM | POA: Diagnosis not present

## 2018-07-29 DIAGNOSIS — Z7189 Other specified counseling: Secondary | ICD-10-CM | POA: Diagnosis not present

## 2018-07-29 DIAGNOSIS — Z0001 Encounter for general adult medical examination with abnormal findings: Secondary | ICD-10-CM | POA: Diagnosis not present

## 2018-07-29 DIAGNOSIS — R1084 Generalized abdominal pain: Secondary | ICD-10-CM | POA: Diagnosis not present

## 2018-07-29 DIAGNOSIS — Z6837 Body mass index (BMI) 37.0-37.9, adult: Secondary | ICD-10-CM | POA: Diagnosis not present

## 2018-07-29 DIAGNOSIS — E669 Obesity, unspecified: Secondary | ICD-10-CM | POA: Diagnosis not present

## 2018-07-29 DIAGNOSIS — Z1339 Encounter for screening examination for other mental health and behavioral disorders: Secondary | ICD-10-CM | POA: Diagnosis not present

## 2018-08-05 ENCOUNTER — Other Ambulatory Visit: Payer: Self-pay | Admitting: Internal Medicine

## 2018-08-05 DIAGNOSIS — E041 Nontoxic single thyroid nodule: Secondary | ICD-10-CM

## 2018-08-09 ENCOUNTER — Other Ambulatory Visit: Payer: Medicare HMO

## 2018-08-12 ENCOUNTER — Ambulatory Visit
Admission: RE | Admit: 2018-08-12 | Discharge: 2018-08-12 | Disposition: A | Discharge status: Home / Self Care | Payer: Medicare HMO | Source: Ambulatory Visit | Attending: Internal Medicine | Admitting: Internal Medicine

## 2018-08-12 DIAGNOSIS — Z1231 Encounter for screening mammogram for malignant neoplasm of breast: Secondary | ICD-10-CM | POA: Diagnosis not present

## 2018-08-12 DIAGNOSIS — E042 Nontoxic multinodular goiter: Secondary | ICD-10-CM | POA: Diagnosis not present

## 2018-08-12 DIAGNOSIS — E041 Nontoxic single thyroid nodule: Secondary | ICD-10-CM

## 2018-08-18 ENCOUNTER — Ambulatory Visit: Payer: Medicare HMO

## 2018-08-24 ENCOUNTER — Encounter: Payer: Self-pay | Admitting: Internal Medicine

## 2018-09-02 ENCOUNTER — Other Ambulatory Visit: Payer: Self-pay | Admitting: Internal Medicine

## 2018-09-02 DIAGNOSIS — E041 Nontoxic single thyroid nodule: Secondary | ICD-10-CM

## 2018-09-23 ENCOUNTER — Encounter: Payer: Self-pay | Admitting: Internal Medicine

## 2018-09-23 ENCOUNTER — Ambulatory Visit: Payer: Medicare HMO | Admitting: Internal Medicine

## 2018-09-23 VITALS — BP 122/82 | HR 76 | Ht 65.0 in | Wt 211.4 lb

## 2018-09-23 DIAGNOSIS — E739 Lactose intolerance, unspecified: Secondary | ICD-10-CM | POA: Diagnosis not present

## 2018-09-23 DIAGNOSIS — K580 Irritable bowel syndrome with diarrhea: Secondary | ICD-10-CM

## 2018-09-23 MED ORDER — DICYCLOMINE HCL 20 MG PO TABS: 20.0000 mg | ORAL_TABLET | Freq: Four times a day (QID) | ORAL | 0 refills | Status: DC | PRN

## 2018-09-23 NOTE — Progress Notes (Signed)
Jennifer [name] 57 y.o. 08/04/1961 [id number]  Assessment & Plan:   Encounter Diagnoses  Name Primary?  . Irritable bowel syndrome with diarrhea Yes  . Lactose intolerance     This sounds most like irritable bowel syndrome with diarrhea (and perhaps even some constipation).  Lactose intolerance seems likely as opposed to a milk allergy though I suppose there is some possibility with that if she had IgE derived blood test my knowledge those are inaccurate in terms of figuring out food allergies and intolerances.  # 1 I have asked to get the records from primary care over the last 6 months to understand the lab testing and and look at her office notes  #2 keep a food diary to try to understand what food triggers cause these episodic episodes of abdominal cramps nausea and diarrhea  #3 trial of dicyclomine 20 mg every 6 hours as needed when she is having a flare  #4 she is to contact me in January to get an appointment for February as I do not have that schedule yet, should things progress and worsen would consider colonoscopy sooner than planned 2023 versus other testing such as cross-sectional imaging.  Keep in mind a brother has Crohn's disease, so that is in the differential diagnosis but at this point I would think more likely irritable bowel syndrome plus or minus lactose intolerance  I appreciate the opportunity to care for this patient. CC: Ernestene Kiel, MD   Subjective:   Chief Complaint: Abdominal pain nausea diarrhea  HPI This is a 57 year old African-American woman known to me from a colonoscopy for screening in 2013 (no polyps, sigmoid diverticulosis, severe) who has been having a several year history of episodic abdominal cramps and diarrhea with nausea no vomiting.  She has been using 4 g of Questran intermittently to help it when her "stomach flares".  That was helping but it seems less effective.  She does not have bleeding.  Overall she has an irregular bowel  habit and does not move her bowels on some days.  It sounds like milk products do bother her especially ice cream though she is not sure of other food triggers.  She tells me her primary care provider has tested her and she is "a little allergic to milk".  She does not have skin rashes wheezing or other allergic signs similar to that.  She has used Lactaid milk and still had problems at least recently when she ate some cereal she seemed to have a flare start and she was sick with nausea and diarrhea and abdominal cramps for a few days.  Ondansetron 8 mg disintegrating tablet helps quite a bit when she has nausea.  No new medications associated with this and again is been going on for several years.  She is status post cholecystectomy but that was around 2006. Wt Readings from Last 3 Encounters:  09/23/18 211 lb 6 oz (95.9 kg)  03/23/18 212 lb 12.8 oz (96.5 kg)  08/05/17 208 lb 3.2 oz (94.4 kg)   Allergies  Allergen Reactions  . Darvocet [Propoxyphene N-Acetaminophen] Swelling  . Latex Hives    "contact only'  . Lodine [Etodolac] Swelling  . Methocarbamol Swelling   Current Meds  Medication Sig  . cholestyramine (QUESTRAN) 4 GM/DOSE powder Take 4 g by mouth daily as needed (stomach pain).  Marland Kitchen diclofenac sodium (VOLTAREN) 1 % GEL Apply 2 g topically 4 (four) times daily.  Marland Kitchen gabapentin (NEURONTIN) 300 MG capsule Take 300 mg by mouth  at bedtime.  . hydrochlorothiazide (HYDRODIURIL) 25 MG tablet Take 25 mg by mouth daily.  Marland Kitchen ipratropium (ATROVENT) 0.03 % nasal spray Place 2 sprays into both nostrils 2 (two) times daily.  . ondansetron (ZOFRAN-ODT) 8 MG disintegrating tablet Take 8 mg by mouth as needed.  . traMADol (ULTRAM) 50 MG tablet Take by mouth every 6 (six) hours as needed.   Past Medical History:  Diagnosis Date  . Arthritis    arthritis back and knees  . Blood transfusion 05/2011  . Hypertension   . Transfusion history    s/p 2012 [facility])   Past Surgical History:  Procedure  Laterality Date  . ABDOMINAL HYSTERECTOMY  1996   partial  . BACK SURGERY  2003/2007   discectomy lower back '03/ '07 rods and artificial disk- Fusion done last time.  . CERVICAL FUSION  2006  . CHOLECYSTECTOMY  2006  . COLONOSCOPY    . JOINT REPLACEMENT    . TOTAL KNEE ARTHROPLASTY  04/2011   Right  . TOTAL KNEE ARTHROPLASTY Left 08/18/2016   Procedure: LEFT TOTAL KNEE ARTHROPLASTY;  Surgeon: Gaynelle Arabian, MD;  Location: [facility];  Service: Orthopedics;  Laterality: Left;   Social History   Social History Narrative   She is widowed and she has 3 daughters   Disabled   Never smoker no other tobacco   No alcohol, no illicit drug use   1 caffeinated beverage daily   family history includes Cancer in her father; Crohn's disease in her brother; Diabetes in her brother and brother.   Review of Systems Back pain cough arthritis symptoms allergies and sinus trouble.  Systems are is all negative or as per HPI.  Objective:   Physical Exam @BP  122/82   Pulse 76   Ht 5\' 5"  (1.651 m)   Wt 211 lb 6 oz (95.9 kg)   BMI 35.17 kg/m @  General:  Well-developed, well-nourished and in no acute distress Eyes:  anicteric. ENT:   Mouth and posterior pharynx free of lesions.  Neck:   supple w/o thyromegaly or mass.  Lungs: Clear to auscultation bilaterally. Heart:  S1S2, no rubs, murmurs, gallops. Abdomen:  soft, non-tender, no hepatosplenomegaly, hernia, or mass and BS+.  Lymph:  no cervical or supraclavicular adenopathy. Extremities:   no edema, cyanosis or clubbing Skin   no rash. Neuro:  A&O x 3.  Psych:  appropriate mood and  Affect.   Data Reviewed: See HPI

## 2018-09-23 NOTE — Patient Instructions (Signed)
If you are age 57 or older, your body mass index should be between 23-30. Your Body mass index is 35.17 kg/m. If this is out of the aforementioned range listed, please consider follow up with your Primary Care Provider.  If you are age 67 or younger, your body mass index should be between 19-25. Your Body mass index is 35.17 kg/m. If this is out of the aformentioned range listed, please consider follow up with your Primary Care Provider.   We have sent the following medications to your pharmacy for you to pick up at your convenience: Bentyl  You can use Bentyl instead of Questran or both for a flare up of symptoms.   Start a food diary, to look for triggers.  Call in Jan 2020 for a routine follow up.   It was a pleasure to see you today!  Dr. Silvano Rusk

## 2018-09-29 ENCOUNTER — Other Ambulatory Visit (HOSPITAL_COMMUNITY)
Admission: RE | Admit: 2018-09-29 | Discharge: 2018-09-29 | Disposition: A | Discharge status: Home / Self Care | Payer: Medicare HMO | Source: Ambulatory Visit | Attending: Radiology | Admitting: Radiology

## 2018-09-29 ENCOUNTER — Ambulatory Visit
Admission: RE | Admit: 2018-09-29 | Discharge: 2018-09-29 | Disposition: A | Discharge status: Home / Self Care | Payer: Medicare HMO | Source: Ambulatory Visit | Attending: Internal Medicine | Admitting: Internal Medicine

## 2018-09-29 DIAGNOSIS — E041 Nontoxic single thyroid nodule: Secondary | ICD-10-CM

## 2018-11-22 DIAGNOSIS — R109 Unspecified abdominal pain: Secondary | ICD-10-CM | POA: Diagnosis not present

## 2018-11-22 DIAGNOSIS — Z6834 Body mass index (BMI) 34.0-34.9, adult: Secondary | ICD-10-CM | POA: Diagnosis not present

## 2018-11-22 DIAGNOSIS — M792 Neuralgia and neuritis, unspecified: Secondary | ICD-10-CM | POA: Diagnosis not present

## 2018-11-22 DIAGNOSIS — I10 Essential (primary) hypertension: Secondary | ICD-10-CM | POA: Diagnosis not present

## 2018-11-22 DIAGNOSIS — R110 Nausea: Secondary | ICD-10-CM | POA: Diagnosis not present

## 2018-11-22 DIAGNOSIS — G8929 Other chronic pain: Secondary | ICD-10-CM | POA: Diagnosis not present

## 2018-11-22 DIAGNOSIS — J309 Allergic rhinitis, unspecified: Secondary | ICD-10-CM | POA: Diagnosis not present

## 2018-11-22 DIAGNOSIS — Z780 Asymptomatic menopausal state: Secondary | ICD-10-CM | POA: Diagnosis not present

## 2018-11-22 DIAGNOSIS — M1990 Unspecified osteoarthritis, unspecified site: Secondary | ICD-10-CM | POA: Diagnosis not present

## 2018-11-22 DIAGNOSIS — E669 Obesity, unspecified: Secondary | ICD-10-CM | POA: Diagnosis not present

## 2018-11-30 DIAGNOSIS — M542 Cervicalgia: Secondary | ICD-10-CM | POA: Diagnosis not present

## 2018-11-30 DIAGNOSIS — M5030 Other cervical disc degeneration, unspecified cervical region: Secondary | ICD-10-CM | POA: Diagnosis not present

## 2018-11-30 DIAGNOSIS — M5412 Radiculopathy, cervical region: Secondary | ICD-10-CM | POA: Diagnosis not present

## 2018-11-30 DIAGNOSIS — M4322 Fusion of spine, cervical region: Secondary | ICD-10-CM | POA: Diagnosis not present

## 2018-11-30 DIAGNOSIS — M47816 Spondylosis without myelopathy or radiculopathy, lumbar region: Secondary | ICD-10-CM | POA: Diagnosis not present

## 2018-12-07 ENCOUNTER — Encounter (INDEPENDENT_AMBULATORY_CARE_PROVIDER_SITE_OTHER): Payer: Self-pay

## 2018-12-07 ENCOUNTER — Encounter: Payer: Self-pay | Admitting: Internal Medicine

## 2018-12-07 ENCOUNTER — Ambulatory Visit: Payer: Medicare HMO | Admitting: Internal Medicine

## 2018-12-07 DIAGNOSIS — E739 Lactose intolerance, unspecified: Secondary | ICD-10-CM | POA: Diagnosis not present

## 2018-12-07 DIAGNOSIS — J069 Acute upper respiratory infection, unspecified: Secondary | ICD-10-CM | POA: Diagnosis not present

## 2018-12-07 MED ORDER — DICYCLOMINE HCL 20 MG PO TABS: 20.0000 mg | ORAL_TABLET | Freq: Four times a day (QID) | ORAL | 11 refills | Status: DC | PRN

## 2018-12-07 NOTE — Patient Instructions (Signed)
  Read and follow the handout on Lactose intolerance we have given you today.   We have sent the following medications to your pharmacy for you to pick up at your convenience: Dicyclomine   Follow up with Dr Carlean Purl as needed.    I appreciate the opportunity to care for you. Silvano Rusk, MD, [facility]

## 2018-12-07 NOTE — Progress Notes (Signed)
Jennifer [name] 58 y.o. 12-03-1960 [id number]  Assessment & Plan:   Encounter Diagnosis  Name Primary?  . Lactose intolerance    History supports this diagnosis We reviewed limiting and avoiding milk products and using lactose-reduced/free products and lactase supplements May use dicyclomine as needed There is a family history of Crohn's disease in her brother and she has had some questions about that but based upon the history that milk products seem to be the sole source of her problems I would not work this up further but if she has progressive problems new problems she is to contact me and we can reconsider She will f/u prn and is in line for a screening colonoscopy in 2023  I appreciate the opportunity to care for this patient. CC: Ernestene Kiel, MD  Subjective:   Chief Complaint: diarrhea  HPI The patient is here after she was seen in December.  At that time presumptive diagnosis was IBS with diarrhea predominance though there was a suspicion of lactose intolerance.  I asked her to keep a food diary and she says she has fairly reliably determined that milk products especially ice cream caused gas bloating cramps and diarrhea.  Dicyclomine has been an effective aid to treat this when it occurs.  She is using minimal milk with her cereal but continues to intermittently eat ice cream.  She also notices if she eats late and goes to bed soon after she feels unwell with bloating.  TSH in October 2019 was normal, hemoglobin was 14 at the same time and her chemistries and electrolytes were normal as well.  Wt Readings from Last 3 Encounters:  12/07/18 211 lb 12.8 oz (96.1 kg)  09/23/18 211 lb 6 oz (95.9 kg)  03/23/18 212 lb 12.8 oz (96.5 kg)    Allergies  Allergen Reactions  . Darvocet [Propoxyphene N-Acetaminophen] Swelling  . Latex Hives    "contact only'  . Lodine [Etodolac] Swelling  . Methocarbamol Swelling   Current Meds  Medication Sig  . cholestyramine  (QUESTRAN) 4 GM/DOSE powder Take 4 g by mouth daily as needed (stomach pain).  Marland Kitchen diclofenac sodium (VOLTAREN) 1 % GEL Apply 2 g topically 4 (four) times daily.  Marland Kitchen dicyclomine (BENTYL) 20 MG tablet Take 1 tablet (20 mg total) by mouth every 6 (six) hours as needed for spasms (diarrhea, abdominal cramps).  . hydrochlorothiazide (HYDRODIURIL) 25 MG tablet Take 25 mg by mouth daily.  Marland Kitchen ipratropium (ATROVENT) 0.03 % nasal spray Place 2 sprays into both nostrils 2 (two) times daily.  . ondansetron (ZOFRAN-ODT) 8 MG disintegrating tablet Take 8 mg by mouth as needed.  . traMADol (ULTRAM) 50 MG tablet Take by mouth every 6 (six) hours as needed.   Past Medical History:  Diagnosis Date  . Arthritis    arthritis back and knees  . Blood transfusion 05/2011  . Hypertension   . IBS (irritable bowel syndrome)   . Transfusion history    s/p 2012 [facility])   Past Surgical History:  Procedure Laterality Date  . ABDOMINAL HYSTERECTOMY  1996   partial  . BACK SURGERY  2003/2007   discectomy lower back '03/ '07 rods and artificial disk- Fusion done last time.  . CERVICAL FUSION  2006  . CHOLECYSTECTOMY  2006  . COLONOSCOPY    . TOTAL KNEE ARTHROPLASTY  04/2011   Right  . TOTAL KNEE ARTHROPLASTY Left 08/18/2016   Procedure: LEFT TOTAL KNEE ARTHROPLASTY;  Surgeon: Gaynelle Arabian, MD;  Location: [facility];  Service: Orthopedics;  Laterality: Left;   Social History   Social History Narrative   She is widowed and she has 3 daughters   Disabled   Never smoker no other tobacco   No alcohol, no illicit drug use   1 caffeinated beverage daily   family history includes Crohn's disease in her brother; Diabetes in her brother and brother; Prostate cancer in her father.   Review of Systems As per HPI  Objective:   Physical Exam BP 116/70   Pulse 84   Ht 5\' 5"  (1.651 m)   Wt 211 lb 12.8 oz (96.1 kg)   BMI 35.25 kg/m  No acute distress

## 2018-12-28 DIAGNOSIS — M542 Cervicalgia: Secondary | ICD-10-CM | POA: Diagnosis not present

## 2018-12-28 DIAGNOSIS — M5030 Other cervical disc degeneration, unspecified cervical region: Secondary | ICD-10-CM | POA: Diagnosis not present

## 2018-12-28 DIAGNOSIS — Z79899 Other long term (current) drug therapy: Secondary | ICD-10-CM | POA: Diagnosis not present

## 2018-12-28 DIAGNOSIS — Z79891 Long term (current) use of opiate analgesic: Secondary | ICD-10-CM | POA: Diagnosis not present

## 2018-12-28 DIAGNOSIS — M4322 Fusion of spine, cervical region: Secondary | ICD-10-CM | POA: Diagnosis not present

## 2018-12-28 DIAGNOSIS — G894 Chronic pain syndrome: Secondary | ICD-10-CM | POA: Diagnosis not present

## 2018-12-28 DIAGNOSIS — M47816 Spondylosis without myelopathy or radiculopathy, lumbar region: Secondary | ICD-10-CM | POA: Diagnosis not present

## 2019-01-05 ENCOUNTER — Other Ambulatory Visit: Payer: Self-pay | Admitting: Orthopaedic Surgery

## 2019-01-05 DIAGNOSIS — R5381 Other malaise: Secondary | ICD-10-CM

## 2019-02-08 DIAGNOSIS — G894 Chronic pain syndrome: Secondary | ICD-10-CM | POA: Diagnosis not present

## 2019-02-08 DIAGNOSIS — J302 Other seasonal allergic rhinitis: Secondary | ICD-10-CM | POA: Diagnosis not present

## 2019-02-08 DIAGNOSIS — I10 Essential (primary) hypertension: Secondary | ICD-10-CM | POA: Diagnosis not present

## 2019-02-08 DIAGNOSIS — E041 Nontoxic single thyroid nodule: Secondary | ICD-10-CM | POA: Diagnosis not present

## 2019-02-08 DIAGNOSIS — Z6836 Body mass index (BMI) 36.0-36.9, adult: Secondary | ICD-10-CM | POA: Diagnosis not present

## 2019-02-18 ENCOUNTER — Other Ambulatory Visit: Payer: Self-pay | Admitting: Orthopaedic Surgery

## 2019-02-18 DIAGNOSIS — M542 Cervicalgia: Secondary | ICD-10-CM

## 2019-03-01 ENCOUNTER — Telehealth: Payer: Self-pay | Admitting: Nurse Practitioner

## 2019-03-01 NOTE — Telephone Encounter (Signed)
Phone call to patient to verify medication list and allergies for myelogram procedure. Pt instructed to hold Tramadol for 48hrs prior to myelogram appointment time. Pt verbalized understanding.

## 2019-03-17 NOTE — Discharge Instructions (Signed)
Myelogram Discharge Instructions  1. Go home and rest quietly for the next 24 hours.  It is important to lie flat for the next 24 hours.  Get up only to go to the restroom.  You may lie in the bed or on a couch on your back, your stomach, your left side or your right side.  You may have one pillow under your head.  You may have pillows between your knees while you are on your side or under your knees while you are on your back.  2. DO NOT drive today.  Recline the seat as far back as it will go, while still wearing your seat belt, on the way home.  3. You may get up to go to the bathroom as needed.  You may sit up for 10 minutes to eat.  You may resume your normal diet and medications unless otherwise indicated.  Drink lots of extra fluids today and tomorrow.  4. The incidence of headache, nausea, or vomiting is about 5% (one in 20 patients).  If you develop a headache, lie flat and drink plenty of fluids until the headache goes away.  Caffeinated beverages may be helpful.  If you develop severe nausea and vomiting or a headache that does not go away with flat bed rest, call [phone].  5. You may resume normal activities after your 24 hours of bed rest is over; however, do not exert yourself strongly or do any heavy lifting tomorrow. If when you get up you have a headache when standing, go back to bed and force fluids for another 24 hours.  6. Call your physician for a follow-up appointment.  The results of your myelogram will be sent directly to your physician by the following day.  7. If you have any questions or if complications develop after you arrive home, please call [phone].  Discharge instructions have been explained to the patient.  The patient, or the person responsible for the patient, fully understands these instructions.  YOU MAY RESTART YOUR TRAMADOL AS NEEDED TOMORROW 03/19/2019 AT 08:10AM.

## 2019-03-18 ENCOUNTER — Ambulatory Visit
Admission: RE | Admit: 2019-03-18 | Discharge: 2019-03-18 | Disposition: A | Discharge status: Home / Self Care | Payer: Medicare HMO | Source: Ambulatory Visit | Attending: Orthopaedic Surgery | Admitting: Orthopaedic Surgery

## 2019-03-18 ENCOUNTER — Other Ambulatory Visit: Payer: Self-pay

## 2019-03-18 DIAGNOSIS — M50221 Other cervical disc displacement at C4-C5 level: Secondary | ICD-10-CM | POA: Diagnosis not present

## 2019-03-18 DIAGNOSIS — M50223 Other cervical disc displacement at C6-C7 level: Secondary | ICD-10-CM | POA: Diagnosis not present

## 2019-03-18 DIAGNOSIS — R5381 Other malaise: Secondary | ICD-10-CM

## 2019-03-18 MED ORDER — IOPAMIDOL (ISOVUE-M 300) INJECTION 61%
10.0000 mL | Freq: Once | INTRAMUSCULAR | Status: AC | PRN
  Administered 2019-03-18: 10 mL via INTRATHECAL

## 2019-03-18 MED ORDER — DIAZEPAM 5 MG PO TABS: 10.0000 mg | ORAL_TABLET | Freq: Once | ORAL | Status: DC

## 2019-03-18 MED ORDER — DIAZEPAM 5 MG PO TABS
5.0000 mg | ORAL_TABLET | Freq: Once | ORAL | Status: AC
  Administered 2019-03-18: 5 mg via ORAL

## 2019-03-18 MED ORDER — ONDANSETRON HCL 4 MG/2ML IJ SOLN
4.0000 mg | Freq: Once | INTRAMUSCULAR | Status: AC
  Administered 2019-03-18: 4 mg via INTRAMUSCULAR

## 2019-03-18 MED ORDER — MEPERIDINE HCL 50 MG/ML IJ SOLN
50.0000 mg | Freq: Once | INTRAMUSCULAR | Status: AC
  Administered 2019-03-18: 50 mg via INTRAMUSCULAR

## 2019-03-18 MED ORDER — ONDANSETRON HCL 4 MG/2ML IJ SOLN: 4.0000 mg | Freq: Four times a day (QID) | INTRAMUSCULAR | Status: DC | PRN

## 2019-04-13 DIAGNOSIS — M7918 Myalgia, other site: Secondary | ICD-10-CM | POA: Diagnosis not present

## 2019-04-13 DIAGNOSIS — M47896 Other spondylosis, lumbar region: Secondary | ICD-10-CM | POA: Diagnosis not present

## 2019-04-13 DIAGNOSIS — M47816 Spondylosis without myelopathy or radiculopathy, lumbar region: Secondary | ICD-10-CM | POA: Diagnosis not present

## 2019-04-13 DIAGNOSIS — M47892 Other spondylosis, cervical region: Secondary | ICD-10-CM | POA: Diagnosis not present

## 2019-04-29 DIAGNOSIS — Z1159 Encounter for screening for other viral diseases: Secondary | ICD-10-CM | POA: Diagnosis not present

## 2019-07-08 ENCOUNTER — Other Ambulatory Visit: Payer: Self-pay | Admitting: Internal Medicine

## 2019-07-08 DIAGNOSIS — Z1231 Encounter for screening mammogram for malignant neoplasm of breast: Secondary | ICD-10-CM

## 2019-07-11 ENCOUNTER — Other Ambulatory Visit: Payer: Self-pay | Admitting: *Deleted

## 2019-07-11 DIAGNOSIS — Z20822 Contact with and (suspected) exposure to covid-19: Secondary | ICD-10-CM

## 2019-07-12 LAB — NOVEL CORONAVIRUS, NAA: SARS-CoV-2, NAA: NOT DETECTED

## 2019-07-22 ENCOUNTER — Other Ambulatory Visit: Payer: Self-pay

## 2019-07-22 DIAGNOSIS — Z20822 Contact with and (suspected) exposure to covid-19: Secondary | ICD-10-CM

## 2019-07-22 DIAGNOSIS — Z20828 Contact with and (suspected) exposure to other viral communicable diseases: Secondary | ICD-10-CM | POA: Diagnosis not present

## 2019-07-22 IMAGING — DX DG CHEST 2V
2 series · 2 of 2 positions shown · non-contrast
Comparison: 07/25/2014.

CLINICAL DATA: Chest pain and cough.  Abnormal EKG.

EXAM:
CHEST - 2 VIEW

[chest pa]
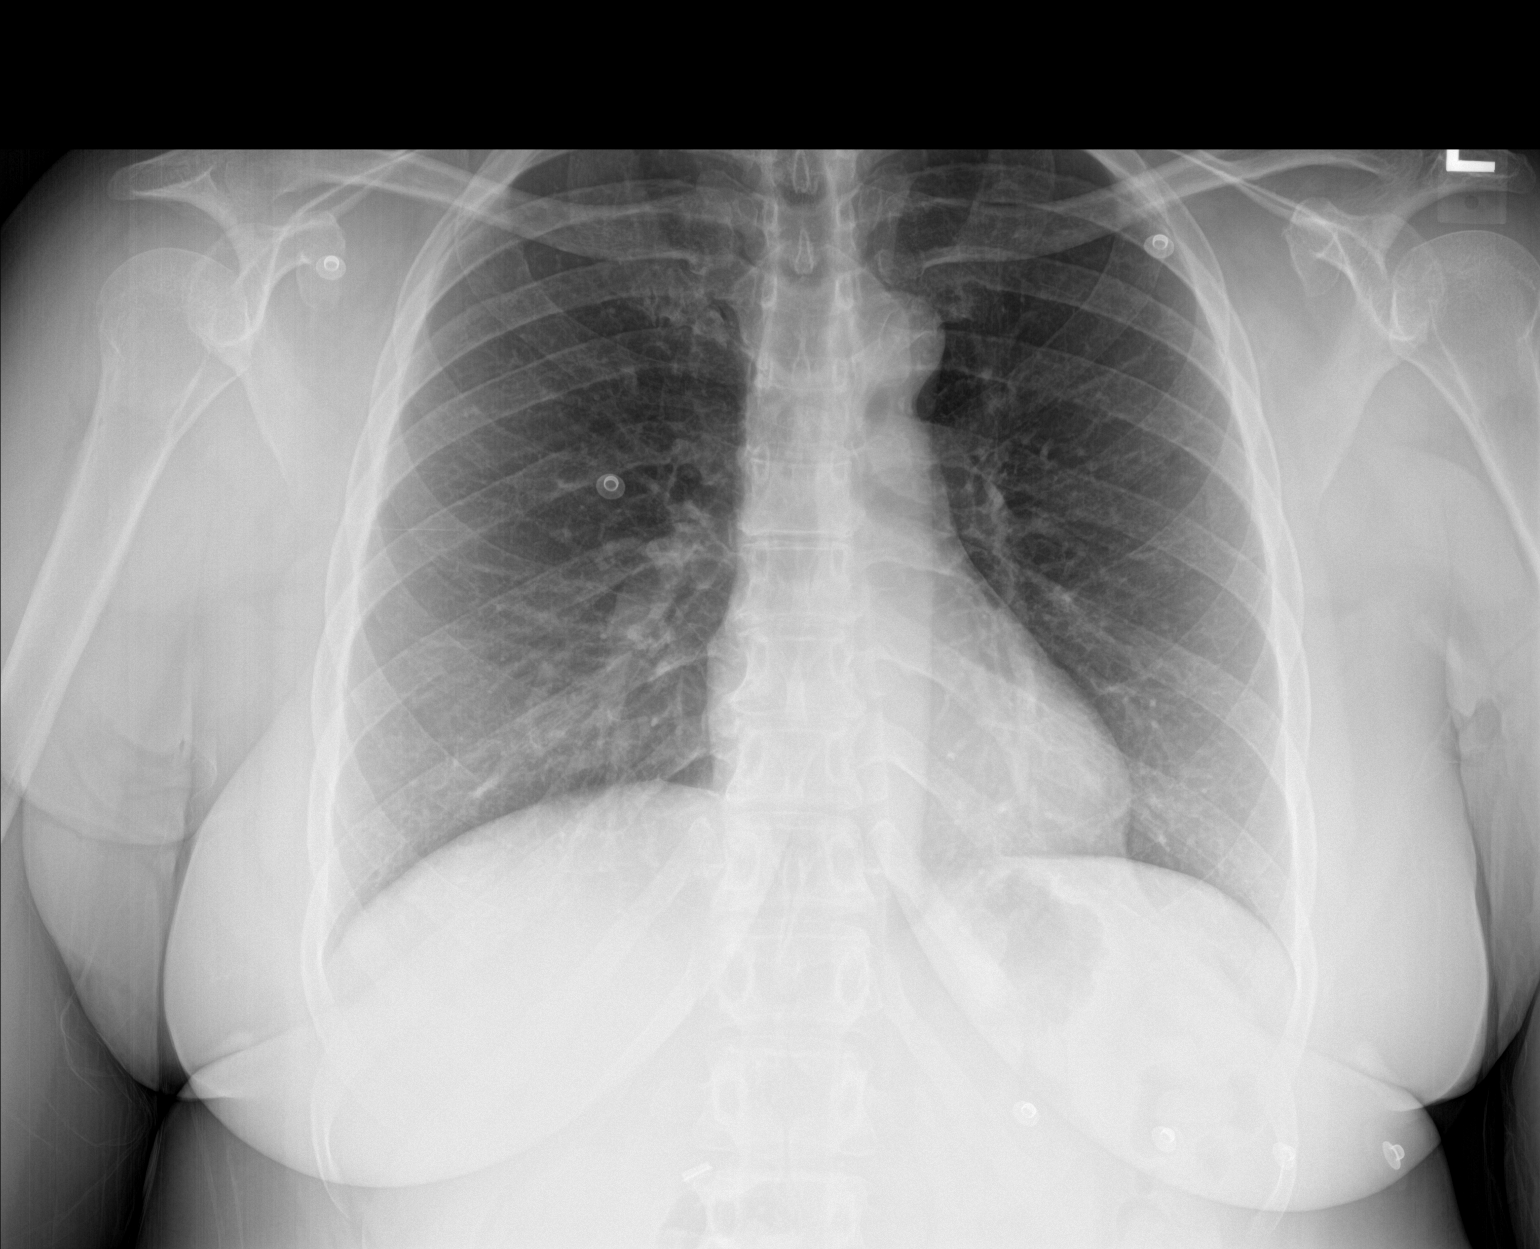

[chest lat]
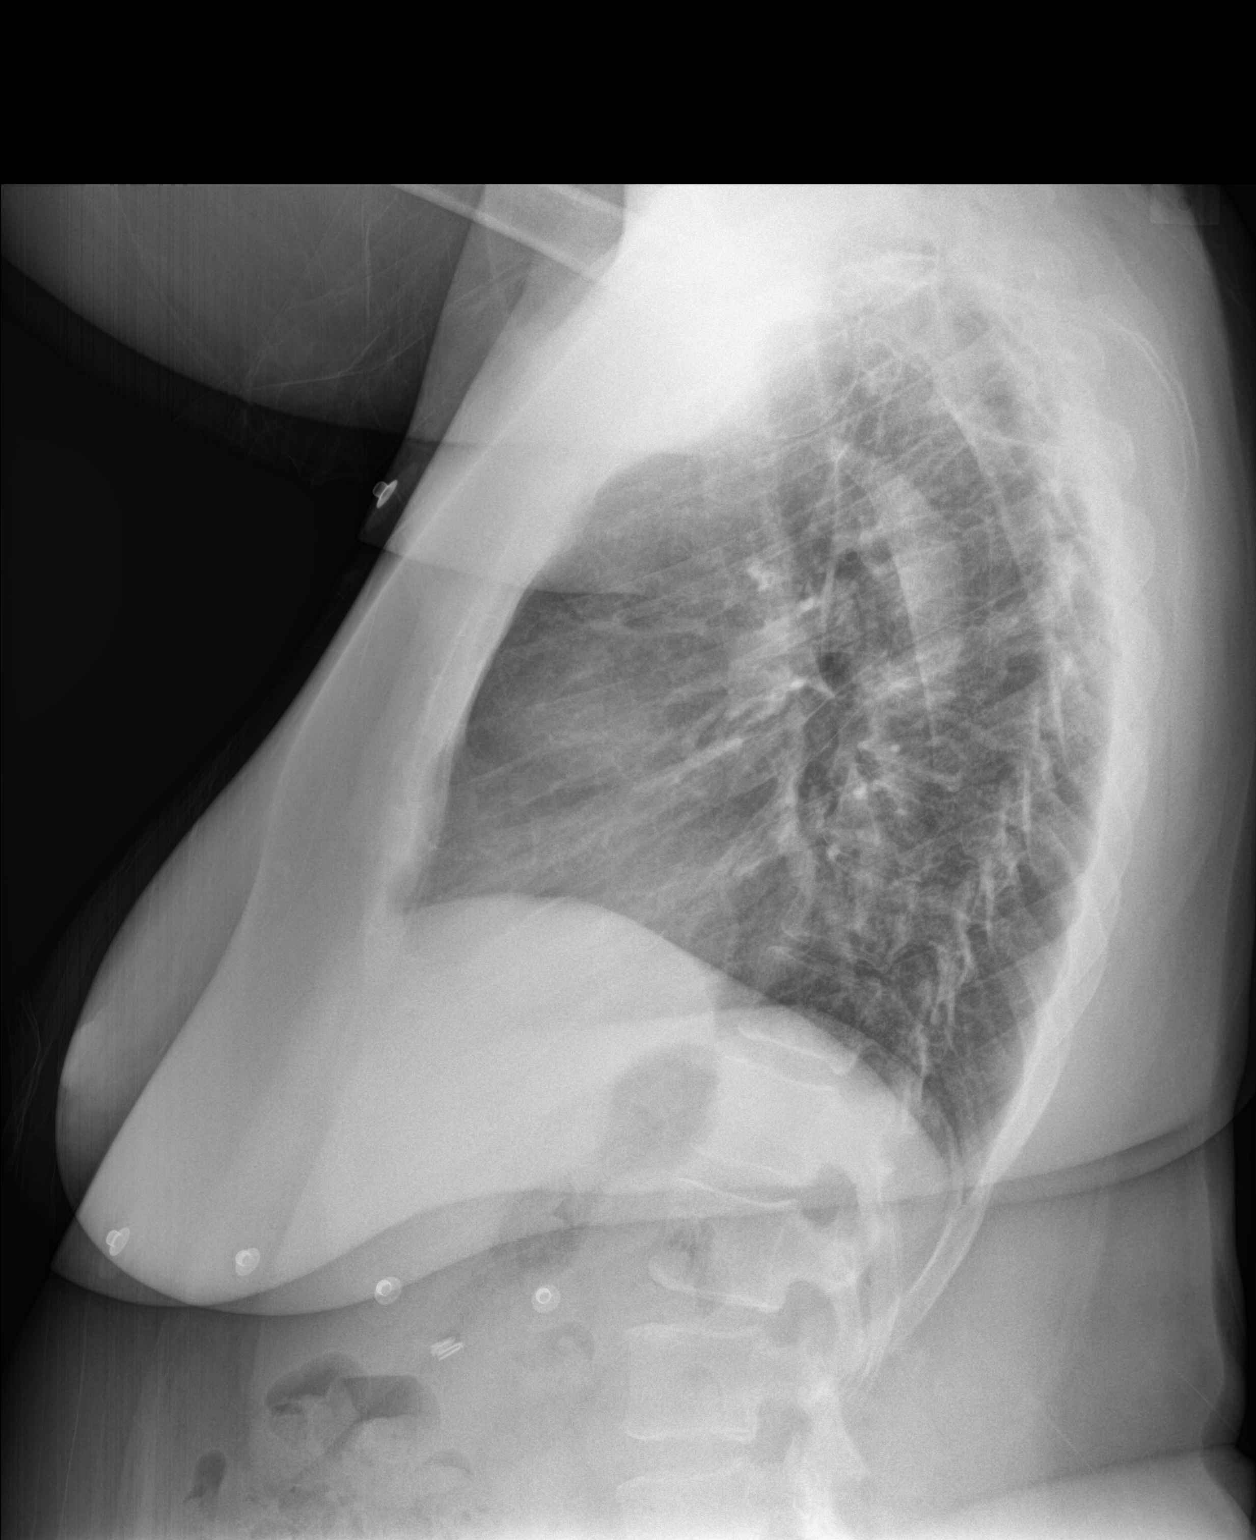

[2 of 2 positions shown; findings below may reference images not displayed]

FINDINGS: Normal sized heart. Clear lungs. Minimal peribronchial thickening.
Cholecystectomy clips. Unremarkable bones.
IMPRESSION: Normal bronchitic changes.

## 2019-07-23 LAB — NOVEL CORONAVIRUS, NAA: SARS-CoV-2, NAA: NOT DETECTED

## 2019-08-02 DIAGNOSIS — Z23 Encounter for immunization: Secondary | ICD-10-CM | POA: Diagnosis not present

## 2019-08-02 DIAGNOSIS — I10 Essential (primary) hypertension: Secondary | ICD-10-CM | POA: Diagnosis not present

## 2019-08-02 DIAGNOSIS — H524 Presbyopia: Secondary | ICD-10-CM | POA: Diagnosis not present

## 2019-08-02 DIAGNOSIS — Z0000 Encounter for general adult medical examination without abnormal findings: Secondary | ICD-10-CM | POA: Diagnosis not present

## 2019-08-02 DIAGNOSIS — H6982 Other specified disorders of Eustachian tube, left ear: Secondary | ICD-10-CM | POA: Diagnosis not present

## 2019-08-02 DIAGNOSIS — E041 Nontoxic single thyroid nodule: Secondary | ICD-10-CM | POA: Diagnosis not present

## 2019-08-02 DIAGNOSIS — Z6834 Body mass index (BMI) 34.0-34.9, adult: Secondary | ICD-10-CM | POA: Diagnosis not present

## 2019-08-02 DIAGNOSIS — Z7189 Other specified counseling: Secondary | ICD-10-CM | POA: Diagnosis not present

## 2019-08-02 DIAGNOSIS — Z1331 Encounter for screening for depression: Secondary | ICD-10-CM | POA: Diagnosis not present

## 2019-08-02 DIAGNOSIS — Z79899 Other long term (current) drug therapy: Secondary | ICD-10-CM | POA: Diagnosis not present

## 2019-08-02 DIAGNOSIS — Z1339 Encounter for screening examination for other mental health and behavioral disorders: Secondary | ICD-10-CM | POA: Diagnosis not present

## 2019-08-08 DIAGNOSIS — H524 Presbyopia: Secondary | ICD-10-CM | POA: Diagnosis not present

## 2019-08-08 DIAGNOSIS — H52223 Regular astigmatism, bilateral: Secondary | ICD-10-CM | POA: Diagnosis not present

## 2019-08-22 ENCOUNTER — Ambulatory Visit
Admission: RE | Admit: 2019-08-22 | Discharge: 2019-08-22 | Disposition: A | Discharge status: Home / Self Care | Payer: Medicare HMO | Source: Ambulatory Visit | Attending: Internal Medicine | Admitting: Internal Medicine

## 2019-08-22 ENCOUNTER — Other Ambulatory Visit: Payer: Self-pay

## 2019-08-22 DIAGNOSIS — Z1231 Encounter for screening mammogram for malignant neoplasm of breast: Secondary | ICD-10-CM

## 2019-08-24 DIAGNOSIS — M7918 Myalgia, other site: Secondary | ICD-10-CM | POA: Diagnosis not present

## 2019-08-24 DIAGNOSIS — M47816 Spondylosis without myelopathy or radiculopathy, lumbar region: Secondary | ICD-10-CM | POA: Diagnosis not present

## 2019-08-24 DIAGNOSIS — M47892 Other spondylosis, cervical region: Secondary | ICD-10-CM | POA: Diagnosis not present

## 2019-08-24 DIAGNOSIS — M47896 Other spondylosis, lumbar region: Secondary | ICD-10-CM | POA: Diagnosis not present

## 2019-08-26 ENCOUNTER — Other Ambulatory Visit: Payer: Self-pay | Admitting: Internal Medicine

## 2019-08-26 DIAGNOSIS — E041 Nontoxic single thyroid nodule: Secondary | ICD-10-CM

## 2019-09-23 DIAGNOSIS — Z96652 Presence of left artificial knee joint: Secondary | ICD-10-CM | POA: Diagnosis not present

## 2019-09-23 DIAGNOSIS — M25561 Pain in right knee: Secondary | ICD-10-CM | POA: Diagnosis not present

## 2019-09-23 DIAGNOSIS — Z471 Aftercare following joint replacement surgery: Secondary | ICD-10-CM | POA: Diagnosis not present

## 2019-09-26 ENCOUNTER — Ambulatory Visit
Admission: RE | Admit: 2019-09-26 | Discharge: 2019-09-26 | Disposition: A | Discharge status: Home / Self Care | Payer: Medicare HMO | Source: Ambulatory Visit | Attending: Internal Medicine | Admitting: Internal Medicine

## 2019-09-26 DIAGNOSIS — E041 Nontoxic single thyroid nodule: Secondary | ICD-10-CM | POA: Diagnosis not present

## 2019-09-30 ENCOUNTER — Other Ambulatory Visit: Payer: Self-pay

## 2019-09-30 DIAGNOSIS — Z20822 Contact with and (suspected) exposure to covid-19: Secondary | ICD-10-CM

## 2019-09-30 DIAGNOSIS — Z20828 Contact with and (suspected) exposure to other viral communicable diseases: Secondary | ICD-10-CM | POA: Diagnosis not present

## 2019-10-01 LAB — NOVEL CORONAVIRUS, NAA: SARS-CoV-2, NAA: NOT DETECTED

## 2019-12-05 DIAGNOSIS — M47892 Other spondylosis, cervical region: Secondary | ICD-10-CM | POA: Diagnosis not present

## 2019-12-05 DIAGNOSIS — M47816 Spondylosis without myelopathy or radiculopathy, lumbar region: Secondary | ICD-10-CM | POA: Diagnosis not present

## 2019-12-05 DIAGNOSIS — M7918 Myalgia, other site: Secondary | ICD-10-CM | POA: Diagnosis not present

## 2019-12-05 DIAGNOSIS — M47896 Other spondylosis, lumbar region: Secondary | ICD-10-CM | POA: Diagnosis not present

## 2019-12-23 DIAGNOSIS — M25561 Pain in right knee: Secondary | ICD-10-CM | POA: Diagnosis not present

## 2020-02-08 DIAGNOSIS — M79642 Pain in left hand: Secondary | ICD-10-CM | POA: Diagnosis not present

## 2020-02-08 DIAGNOSIS — G5602 Carpal tunnel syndrome, left upper limb: Secondary | ICD-10-CM | POA: Insufficient documentation

## 2020-03-26 DIAGNOSIS — M13842 Other specified arthritis, left hand: Secondary | ICD-10-CM | POA: Diagnosis not present

## 2020-03-26 DIAGNOSIS — G5602 Carpal tunnel syndrome, left upper limb: Secondary | ICD-10-CM | POA: Diagnosis not present

## 2020-03-28 NOTE — Patient Instructions (Addendum)
DUE TO COVID-19 ONLY ONE VISITOR IS ALLOWED TO COME WITH YOU AND STAY IN THE WAITING ROOM ONLY DURING PRE OP AND PROCEDURE DAY OF SURGERY. THE 1 VISITOR MAY VISIT WITH YOU AFTER SURGERY IN YOUR PRIVATE ROOM DURING VISITING HOURS ONLY!  YOU NEED TO HAVE A COVID 19 TEST ON: 04/07/20 @ 9:25 am , THIS TEST MUST BE DONE BEFORE SURGERY, COME  Jennifer, [name] , [id number].  [name] ONCE YOUR COVID TEST IS COMPLETED, PLEASE BEGIN THE QUARANTINE INSTRUCTIONS AS OUTLINED IN YOUR HANDOUT.                Jennifer [name]    Your procedure is scheduled on: 04/11/20   Report to [facility] Main  Entrance   Report to short stay at: 5:30 AM     Call this number if you have problems the morning of surgery [phone]    Remember:   NO SOLID FOOD AFTER MIDNIGHT THE NIGHT PRIOR TO SURGERY. NOTHING BY MOUTH EXCEPT CLEAR LIQUIDS UNTIL: 4:15 am . PLEASE FINISH ENSURE DRINK PER SURGEON ORDER  WHICH NEEDS TO BE COMPLETED AT: 4:15 am .   CLEAR LIQUID DIET   Foods Allowed                                                                     Foods Excluded  Coffee and tea, regular and decaf                             liquids that you cannot  Plain Jell-O any favor except red or purple                                           see through such as: Fruit ices (not with fruit pulp)                                     milk, soups, orange juice  Iced Popsicles                                    All solid food Carbonated beverages, regular and diet                                    Cranberry, grape and apple juices Sports drinks like Gatorade Lightly seasoned clear broth or consume(fat free) Sugar, honey syrup  Sample Menu Breakfast                                Lunch                                     Supper Cranberry juice  Beef broth                            Chicken broth Jell-O                                     Grape juice                            Apple juice Coffee or tea                        Jell-O                                      Popsicle                                                Coffee or tea                        Coffee or tea  _____________________________________________________________________  BRUSH YOUR TEETH MORNING OF SURGERY AND RINSE YOUR MOUTH OUT, NO CHEWING GUM CANDY OR MINTS.     Take these medicines the morning of surgery with A SIP OF WATER: loratadine,estradiol.Use flonase as usual.  DO NOT TAKE ANY DIABETIC MEDICATIONS DAY OF YOUR SURGERY                               You may not have any metal on your body including hair pins and              piercings  Do not wear jewelry, make-up, lotions, powders or perfumes, deodorant             Do not wear nail polish on your fingernails.  Do not shave  48 hours prior to surgery.  .   Do not bring valuables to the hospital. [place].  Contacts, dentures or bridgework may not be worn into surgery.  Leave suitcase in the car. After surgery it may be brought to your room.     Patients discharged the day of surgery will not be allowed to drive home. IF YOU ARE HAVING SURGERY AND GOING HOME THE SAME DAY, YOU MUST HAVE AN ADULT TO DRIVE YOU HOME AND BE WITH YOU FOR 24 HOURS. YOU MAY GO HOME BY TAXI OR UBER OR ORTHERWISE, BUT AN ADULT MUST ACCOMPANY YOU HOME AND STAY WITH YOU FOR 24 HOURS.  Name and phone number of your driver:  Special Instructions: N/A              Please read over the following fact sheets you were given: _____________________________________________________________________  [facility] - Preparing for Surgery Before surgery, you can play an important role.  Because skin is not sterile, your skin needs to be as free of germs as possible.  You can reduce the number of germs on your skin by washing with CHG (chlorahexidine gluconate)  soap before surgery.  CHG is an antiseptic cleaner which  kills germs and bonds with the skin to continue killing germs even after washing. Please DO NOT use if you have an allergy to CHG or antibacterial soaps.  If your skin becomes reddened/irritated stop using the CHG and inform your nurse when you arrive at Short Stay. Do not shave (including legs and underarms) for at least 48 hours prior to the first CHG shower.  You may shave your face/neck. Please follow these instructions carefully:  1.  Shower with CHG Soap the night before surgery and the  morning of Surgery.  2.  If you choose to wash your hair, wash your hair first as usual with your  normal  shampoo.  3.  After you shampoo, rinse your hair and body thoroughly to remove the  shampoo.                           4.  Use CHG as you would any other liquid soap.  You can apply chg directly  to the skin and wash                       Gently with a scrungie or clean washcloth.  5.  Apply the CHG Soap to your body ONLY FROM THE NECK DOWN.   Do not use on face/ open                           Wound or open sores. Avoid contact with eyes, ears mouth and genitals (private parts).                       Wash face,  Genitals (private parts) with your normal soap.             6.  Wash thoroughly, paying special attention to the area where your surgery  will be performed.  7.  Thoroughly rinse your body with warm water from the neck down.  8.  DO NOT shower/wash with your normal soap after using and rinsing off  the CHG Soap.                9.  Pat yourself dry with a clean towel.            10.  Wear clean pajamas.            11.  Place clean sheets on your bed the night of your first shower and do not  sleep with pets. Day of Surgery : Do not apply any lotions/deodorants the morning of surgery.  Please wear clean clothes to the hospital/surgery center.  FAILURE TO FOLLOW THESE INSTRUCTIONS MAY RESULT IN THE CANCELLATION OF YOUR SURGERY PATIENT SIGNATURE_________________________________  NURSE  SIGNATURE__________________________________  ________________________________________________________________________   Jennifer [name]  An incentive spirometer is a tool that can help keep your lungs clear and active. This tool measures how well you are filling your lungs with each breath. Taking long deep breaths may help reverse or decrease the chance of developing breathing (pulmonary) problems (especially infection) following:  A long period of time when you are unable to move or be active. BEFORE THE PROCEDURE   If the spirometer includes an indicator to show your best effort, your nurse or respiratory therapist will set it to a desired goal.  If possible, sit up straight or lean slightly forward.  Try not to slouch.  Hold the incentive spirometer in an upright position. INSTRUCTIONS FOR USE  1. Sit on the edge of your bed if possible, or sit up as far as you can in bed or on a chair. 2. Hold the incentive spirometer in an upright position. 3. Breathe out normally. 4. Place the mouthpiece in your mouth and seal your lips tightly around it. 5. Breathe in slowly and as deeply as possible, raising the piston or the ball toward the top of the column. 6. Hold your breath for 3-5 seconds or for as long as possible. Allow the piston or ball to fall to the bottom of the column. 7. Remove the mouthpiece from your mouth and breathe out normally. 8. Rest for a few seconds and repeat Steps 1 through 7 at least 10 times every 1-2 hours when you are awake. Take your time and take a few normal breaths between deep breaths. 9. The spirometer may include an indicator to show your best effort. Use the indicator as a goal to work toward during each repetition. 10. After each set of 10 deep breaths, practice coughing to be sure your lungs are clear. If you have an incision (the cut made at the time of surgery), support your incision when coughing by placing a pillow or rolled up towels firmly  against it. Once you are able to get out of bed, walk around indoors and cough well. You may stop using the incentive spirometer when instructed by your caregiver.  RISKS AND COMPLICATIONS  Take your time so you do not get dizzy or light-headed.  If you are in pain, you may need to take or ask for pain medication before doing incentive spirometry. It is harder to take a deep breath if you are having pain. AFTER USE  Rest and breathe slowly and easily.  It can be helpful to keep track of a log of your progress. Your caregiver can provide you with a simple table to help with this. If you are using the spirometer at home, follow these instructions: [place] IF:   You are having difficultly using the spirometer.  You have trouble using the spirometer as often as instructed.  Your pain medication is not giving enough relief while using the spirometer.  You develop fever of 100.5 F (38.1 C) or higher. SEEK IMMEDIATE MEDICAL CARE IF:   You cough up bloody sputum that had not been present before.  You develop fever of 102 F (38.9 C) or greater.  You develop worsening pain at or near the incision site. MAKE SURE YOU:   Understand these instructions.  Will watch your condition.  Will get help right away if you are not doing well or get worse. Document Released: 02/09/2007 Document Revised: 12/22/2011 Document Reviewed: 04/12/2007 [facility] Patient Information 2014 [place], [place].   ________________________________________________________________________

## 2020-03-29 ENCOUNTER — Encounter (HOSPITAL_COMMUNITY)
Admission: RE | Admit: 2020-03-29 | Discharge: 2020-03-29 | Disposition: A | Discharge status: Home / Self Care | Payer: Medicare HMO | Source: Ambulatory Visit | Attending: Orthopedic Surgery | Admitting: Orthopedic Surgery

## 2020-03-29 ENCOUNTER — Other Ambulatory Visit: Payer: Self-pay

## 2020-03-29 ENCOUNTER — Encounter (HOSPITAL_COMMUNITY): Payer: Self-pay

## 2020-03-29 DIAGNOSIS — Z01818 Encounter for other preprocedural examination: Secondary | ICD-10-CM | POA: Diagnosis present

## 2020-03-29 NOTE — Progress Notes (Signed)
COVID Vaccine Completed: Date COVID Vaccine completed: COVID vaccine manufacturer: [place]   PCP - Dr.Caroline Prochnau  Cardiologist -   Chest x-ray -  EKG -  Stress Test -  ECHO -  Cardiac Cath -   Sleep Study -  CPAP -   Fasting Blood Sugar -  Checks Blood Sugar _____ times a day  Blood Thinner Instructions: Aspirin Instructions: Last Dose:  Anesthesia review:   Patient denies shortness of breath, fever, cough and chest pain at PAT appointment   Patient verbalized understanding of instructions that were given to them at the PAT appointment. Patient was also instructed that they will need to review over the PAT instructions again at home before surgery.

## 2020-04-02 ENCOUNTER — Other Ambulatory Visit: Payer: Self-pay

## 2020-04-02 ENCOUNTER — Encounter (HOSPITAL_COMMUNITY)
Admission: RE | Admit: 2020-04-02 | Discharge: 2020-04-02 | Disposition: A | Discharge status: Home / Self Care | Payer: Medicare HMO | Source: Ambulatory Visit | Attending: Orthopedic Surgery | Admitting: Orthopedic Surgery

## 2020-04-02 DIAGNOSIS — Z01818 Encounter for other preprocedural examination: Secondary | ICD-10-CM | POA: Insufficient documentation

## 2020-04-02 LAB — COMPREHENSIVE METABOLIC PANEL
ALT: 24 U/L (ref 0–44)
AST: 29 U/L (ref 15–41)
Albumin: 4.1 g/dL (ref 3.5–5.0)
Alkaline Phosphatase: 104 U/L (ref 38–126)
Anion gap: 10 (ref 5–15)
BUN: 15 mg/dL (ref 6–20)
CO2: 25 mmol/L (ref 22–32)
Calcium: 8.8 mg/dL — ABNORMAL LOW (ref 8.9–10.3)
Chloride: 107 mmol/L (ref 98–111)
Creatinine, Ser: 0.89 mg/dL (ref 0.44–1.00)
GFR calc Af Amer: >60 mL/min (ref >60)
GFR calc non Af Amer: >60 mL/min (ref >60)
Glucose, Bld: 91 mg/dL (ref 70–99)
Potassium: 3.8 mmol/L (ref 3.5–5.1)
Sodium: 142 mmol/L (ref 135–145)
Total Bilirubin: 0.8 mg/dL (ref 0.3–1.2)
Total Protein: 7.5 g/dL (ref 6.5–8.1)

## 2020-04-02 LAB — CBC
HCT: 48.1 % — ABNORMAL HIGH (ref 36.0–46.0)
Hemoglobin: 15.4 g/dL — ABNORMAL HIGH (ref 12.0–15.0)
MCH: 26.9 pg (ref 26.0–34.0)
MCHC: 32 g/dL (ref 30.0–36.0)
MCV: 84.1 fL (ref 80.0–100.0)
Platelets: 302 10*3/uL (ref 150–400)
RBC: 5.72 MIL/uL — ABNORMAL HIGH (ref 3.87–5.11)
RDW: 14.6 % (ref 11.5–15.5)
WBC: 7.2 10*3/uL (ref 4.0–10.5)
nRBC: 0 % (ref 0.0–0.2)

## 2020-04-02 LAB — SURGICAL PCR SCREEN
MRSA, PCR: NEGATIVE
Staphylococcus aureus: NEGATIVE

## 2020-04-02 LAB — PROTIME-INR
INR: 1 (ref 0.8–1.2)
Prothrombin Time: 13.2 seconds (ref 11.4–15.2)

## 2020-04-02 LAB — APTT: aPTT: 38 seconds — ABNORMAL HIGH (ref 24–36)

## 2020-04-02 NOTE — Progress Notes (Signed)
Lab results: PTT: 38

## 2020-04-03 NOTE — H&P (Signed)
TOTAL KNEE REVISION ADMISSION H&P  Patient is being admitted for right revision total knee arthroplasty.  Subjective:  Chief Complaint:right knee pain.  HPI: Jennifer [name], 59 y.o. female, has a history of pain and functional disability in the right knee(s) due to failed previous arthroplasty and patient has failed non-surgical conservative treatments for greater than 12 weeks to include use of assistive devices and activity modification. The indications for the revision of the total knee arthroplasty are pain and instability with considerable amount of play on physical exam, suggesting polyethylene wear. Onset of symptoms was gradual starting several years ago with gradually worsening course since that time.  Prior procedures on the right knee(s) include arthroplasty.  Patient currently rates pain in the right knee(s) at 7 out of 10 with activity. There is worsening of pain with activity and weight bearing, pain that interferes with activities of daily living and instability.  Patient has evidence of an LCS knee on the right side with a stemmed tibial component. The components are in excellent position with no evidence of loosening or periprosthetic abnormalities by imaging studies. This condition presents safety issues increasing the risk of falls. There is no current active infection.  Patient Active Problem List   Diagnosis Date Noted  . Lactose intolerance 12/07/2018  . OA (osteoarthritis) of knee 08/18/2016   Past Medical History:  Diagnosis Date  . Arthritis    arthritis back and knees  . Blood transfusion 05/2011  . Hypertension   . IBS (irritable bowel syndrome)   . Transfusion history    s/p 2012 RTKA [facility])    Past Surgical History:  Procedure Laterality Date  . ABDOMINAL HYSTERECTOMY  1996   partial  . BACK SURGERY  2003/2007   discectomy lower back '03/ '07 rods and artificial disk- Fusion done last time.  . CERVICAL FUSION  2006  . CHOLECYSTECTOMY  2006  . COLONOSCOPY     . TOTAL KNEE ARTHROPLASTY  04/2011   Right  . TOTAL KNEE ARTHROPLASTY Left 08/18/2016   Procedure: LEFT TOTAL KNEE ARTHROPLASTY;  Surgeon: Gaynelle Arabian, MD;  Location: [facility] ORS;  Service: Orthopedics;  Laterality: Left;    No current facility-administered medications for this encounter.   Current Outpatient Medications  Medication Sig Dispense Refill Last Dose  . cholestyramine (QUESTRAN) 4 GM/DOSE powder Take 4 g by mouth daily as needed (stomach pain).     Marland Kitchen dicyclomine (BENTYL) 20 MG tablet Take 1 tablet (20 mg total) by mouth every 6 (six) hours as needed for spasms (diarrhea, abdominal cramps). 60 tablet 11   . estradiol (ESTRACE) 1 MG tablet Take 1 mg by mouth daily.      . fluticasone (FLONASE) 50 MCG/ACT nasal spray Place 1 spray into both nostrils daily as needed for allergies or rhinitis.     . hydrochlorothiazide (HYDRODIURIL) 25 MG tablet Take 25 mg by mouth daily.     Marland Kitchen loratadine (CLARITIN) 10 MG tablet Take 10 mg by mouth daily as needed for allergies.     . Misc Natural Products (DAILY HERBS IMMUNE DEFENSE PO) Take 1 tablet by mouth daily.     . Multiple Vitamins-Minerals (MULTIVITAMIN WITH MINERALS) tablet Take 1 tablet by mouth daily.     . naproxen sodium (ALEVE) 220 MG tablet Take 220 mg by mouth daily as needed (pain).     . traMADol (ULTRAM) 50 MG tablet Take 50 mg by mouth every 6 (six) hours as needed for moderate pain.  Allergies  Allergen Reactions  . Darvocet [Propoxyphene N-Acetaminophen] Swelling  . Latex Hives    "contact only'  . Lodine [Etodolac] Swelling  . Methocarbamol Swelling    Social History   Tobacco Use  . Smoking status: Never Smoker  . Smokeless tobacco: Never Used  Substance Use Topics  . Alcohol use: No    Family History  Problem Relation Age of Onset  . Prostate cancer Father   . Diabetes Brother   . Crohn's disease Brother   . Diabetes Brother   . Colon cancer Neg Hx   . Esophageal cancer Neg Hx   . Pancreatic cancer Neg  Hx   . Stomach cancer Neg Hx   . Liver disease Neg Hx       Review of Systems  Constitutional: Negative for chills and fever.  HENT: Negative for congestion, sore throat and tinnitus.   Eyes: Negative for photophobia and pain.  Respiratory: Negative for cough, shortness of breath and wheezing.   Cardiovascular: Negative for chest pain and palpitations.  Gastrointestinal: Negative for nausea and vomiting.  Genitourinary: Negative for dysuria, frequency and urgency.  Neurological: Negative for dizziness, weakness and headaches.     Objective:  Physical Exam  Well nourished and well developed.  General: Alert and oriented x3, cooperative and pleasant, no acute distress.  Head: normocephalic, atraumatic, neck supple.  Eyes: EOMI.  Respiratory: breath sounds clear in all fields, no wheezing, rales, or rhonchi. Cardiovascular: Regular rate and rhythm, no murmurs, gallops or rubs.  Abdomen: non-tender to palpation and soft, normoactive bowel sounds. Musculoskeletal:  Right knee exam:  Scar s/p TKA.  There is no effusion.  The range of motion is a few degrees of hyperextension to 120 degrees of flexion.  Significant amount of varus and valgus laxity in extension and AP laxity in flexion.  Calves soft and nontender. Motor function intact in LE. Strength 5/5 LE bilaterally. Neuro: Distal pulses 2+. Sensation to light touch intact in LE.  Vital signs in last 24 hours: Temp:  [98.4 F (36.9 C)] 98.4 F (36.9 C) (06/21 0815) Pulse Rate:  [76] 76 (06/21 0815) Resp:  [18] 18 (06/21 0815) BP: (137)/(79) 137/79 (06/21 0815) SpO2:  [99 %] 99 % (06/21 0815) Weight:  [90.7 kg] 90.7 kg (06/21 0815)  Labs:  Estimated body mass index is 33.29 kg/m as calculated from the following:   Height as of 04/02/20: 5\' 5"  (1.651 m).   Weight as of 04/02/20: 90.7 kg.  Imaging Review Radiographs- AP and lateral of the bilateral knees dated 12/23/2019 demonstrate an LCS knee on the right side with a  stemmed tibial component. The components are in excellent position with no evidence of loosening or periprosthetic abnormalities.  Assessment/Plan:  End stage arthritis, right knee(s) with failed previous arthroplasty.   The patient history, physical examination, clinical judgment of the provider and imaging studies are consistent with end stage degenerative joint disease of the right knee(s), previous total knee arthroplasty. Revision total knee arthroplasty is deemed medically necessary. The treatment options including medical management, injection therapy, arthroscopy and revision arthroplasty were discussed at length. The risks and benefits of revision total knee arthroplasty were presented and reviewed. The risks due to aseptic loosening, infection, stiffness, patella tracking problems, thromboembolic complications and other imponderables were discussed. The patient acknowledged the explanation, agreed to proceed with the plan and consent was signed. Patient is being admitted for inpatient treatment for surgery, pain control, PT, OT, prophylactic antibiotics, VTE prophylaxis, progressive ambulation and ADL's  and discharge planning.The patient is planning to be discharged home.  Therapy Plans: Outpatient therapy at [facility] Disposition: Home with daughter Planned DVT Prophylaxis: Aspirin 325 mg BID DME Needed: None PCP: Ernestene Kiel, MD (has not been cleared) TXA: IV Allergies: Oxycodone (swelling), latex (rash), methocarbamol (swelling) Anesthesia Concerns: None BMI: 34.8  Other: - Plan for 23 hour observation  - Patient was instructed on what medications to stop prior to surgery. - Follow-up visit in 2 weeks with Dr. Wynelle Link - Begin physical therapy following surgery - Pre-operative lab work as pre-surgical testing - Prescriptions will be provided in hospital at time of discharge  Theresa Duty, PA-C Orthopedic Surgery [facility] [place]

## 2020-04-05 ENCOUNTER — Encounter (HOSPITAL_COMMUNITY): Admission: RE | Admit: 2020-04-05 | Payer: Medicare HMO | Source: Ambulatory Visit

## 2020-04-07 ENCOUNTER — Other Ambulatory Visit (HOSPITAL_COMMUNITY)
Admission: RE | Admit: 2020-04-07 | Discharge: 2020-04-07 | Disposition: A | Discharge status: Home / Self Care | Payer: Medicare HMO | Source: Ambulatory Visit | Attending: Orthopedic Surgery | Admitting: Orthopedic Surgery

## 2020-04-07 DIAGNOSIS — Z20822 Contact with and (suspected) exposure to covid-19: Secondary | ICD-10-CM | POA: Diagnosis not present

## 2020-04-07 DIAGNOSIS — Z01812 Encounter for preprocedural laboratory examination: Secondary | ICD-10-CM | POA: Insufficient documentation

## 2020-04-07 LAB — SARS CORONAVIRUS 2 (TAT 6-24 HRS): SARS Coronavirus 2: NEGATIVE

## 2020-04-10 MED ORDER — BUPIVACAINE LIPOSOME 1.3 % IJ SUSP
20.0000 mL | Freq: Once | INTRAMUSCULAR | Status: DC
  Filled 2020-04-10: qty 20

## 2020-04-10 NOTE — Progress Notes (Signed)
Pt. Was notified about the surgical time change for tomorrow.Pt. aware to drink ensure at 6:00 am.Also to report to admitting at 6:30 am.

## 2020-04-11 ENCOUNTER — Encounter (HOSPITAL_COMMUNITY): Payer: Self-pay | Admitting: Orthopedic Surgery

## 2020-04-11 ENCOUNTER — Inpatient Hospital Stay (HOSPITAL_COMMUNITY): Payer: Medicare HMO | Admitting: Certified Registered Nurse Anesthetist

## 2020-04-11 ENCOUNTER — Other Ambulatory Visit: Payer: Self-pay

## 2020-04-11 ENCOUNTER — Inpatient Hospital Stay (HOSPITAL_COMMUNITY)
Admission: RE | Admit: 2020-04-11 | Discharge: 2020-04-12 | DRG: 489 | Disposition: A | Discharge status: Home / Self Care | Payer: Medicare HMO | Attending: Orthopedic Surgery | Admitting: Orthopedic Surgery

## 2020-04-11 ENCOUNTER — Encounter (HOSPITAL_COMMUNITY)
Admission: RE | Disposition: A | Discharge status: Home / Self Care | Payer: Self-pay | Source: Home / Self Care | Attending: Orthopedic Surgery

## 2020-04-11 DIAGNOSIS — Z91040 Latex allergy status: Secondary | ICD-10-CM

## 2020-04-11 DIAGNOSIS — Z79890 Hormone replacement therapy (postmenopausal): Secondary | ICD-10-CM

## 2020-04-11 DIAGNOSIS — Y831 Surgical operation with implant of artificial internal device as the cause of abnormal reaction of the patient, or of later complication, without mention of misadventure at the time of the procedure: Secondary | ICD-10-CM | POA: Diagnosis present

## 2020-04-11 DIAGNOSIS — Z981 Arthrodesis status: Secondary | ICD-10-CM | POA: Diagnosis not present

## 2020-04-11 DIAGNOSIS — Z833 Family history of diabetes mellitus: Secondary | ICD-10-CM

## 2020-04-11 DIAGNOSIS — T84022A Instability of internal right knee prosthesis, initial encounter: Secondary | ICD-10-CM | POA: Diagnosis not present

## 2020-04-11 DIAGNOSIS — Z888 Allergy status to other drugs, medicaments and biological substances status: Secondary | ICD-10-CM

## 2020-04-11 DIAGNOSIS — T84092A Other mechanical complication of internal right knee prosthesis, initial encounter: Secondary | ICD-10-CM | POA: Diagnosis not present

## 2020-04-11 DIAGNOSIS — M1712 Unilateral primary osteoarthritis, left knee: Secondary | ICD-10-CM | POA: Diagnosis not present

## 2020-04-11 DIAGNOSIS — Z96652 Presence of left artificial knee joint: Secondary | ICD-10-CM | POA: Diagnosis present

## 2020-04-11 DIAGNOSIS — T84012A Broken internal right knee prosthesis, initial encounter: Secondary | ICD-10-CM

## 2020-04-11 DIAGNOSIS — Z79899 Other long term (current) drug therapy: Secondary | ICD-10-CM

## 2020-04-11 DIAGNOSIS — Z96651 Presence of right artificial knee joint: Secondary | ICD-10-CM | POA: Diagnosis not present

## 2020-04-11 DIAGNOSIS — K589 Irritable bowel syndrome without diarrhea: Secondary | ICD-10-CM | POA: Diagnosis not present

## 2020-04-11 DIAGNOSIS — I10 Essential (primary) hypertension: Secondary | ICD-10-CM | POA: Diagnosis not present

## 2020-04-11 DIAGNOSIS — G8918 Other acute postprocedural pain: Secondary | ICD-10-CM | POA: Diagnosis not present

## 2020-04-11 HISTORY — PX: TOTAL KNEE REVISION: SHX996

## 2020-04-11 LAB — TYPE AND SCREEN
ABO/RH(D): O POS
Antibody Screen: NEGATIVE

## 2020-04-11 SURGERY — TOTAL KNEE REVISION
Anesthesia: General | Site: Knee | Laterality: Right

## 2020-04-11 MED ORDER — DICYCLOMINE HCL 20 MG PO TABS
20.0000 mg | ORAL_TABLET | Freq: Four times a day (QID) | ORAL | Status: DC | PRN
  Filled 2020-04-11: qty 1

## 2020-04-11 MED ORDER — ONDANSETRON HCL 4 MG/2ML IJ SOLN
4.0000 mg | Freq: Four times a day (QID) | INTRAMUSCULAR | Status: DC | PRN
  Administered 2020-04-12: 4 mg via INTRAVENOUS
  Filled 2020-04-11: qty 2

## 2020-04-11 MED ORDER — ACETAMINOPHEN 500 MG PO TABS
1000.0000 mg | ORAL_TABLET | Freq: Four times a day (QID) | ORAL | Status: DC
  Administered 2020-04-11 – 2020-04-12 (×3): 1000 mg via ORAL
  Filled 2020-04-11 (×3): qty 2

## 2020-04-11 MED ORDER — LACTATED RINGERS IV SOLN: INTRAVENOUS | Status: DC

## 2020-04-11 MED ORDER — PROPOFOL 10 MG/ML IV BOLUS
INTRAVENOUS | Status: AC
  Filled 2020-04-11: qty 20

## 2020-04-11 MED ORDER — POLYETHYLENE GLYCOL 3350 17 G PO PACK: 17.0000 g | PACK | Freq: Every day | ORAL | Status: DC | PRN

## 2020-04-11 MED ORDER — CEFAZOLIN SODIUM-DEXTROSE 2-4 GM/100ML-% IV SOLN
2.0000 g | Freq: Four times a day (QID) | INTRAVENOUS | Status: AC
  Administered 2020-04-11 (×2): 2 g via INTRAVENOUS
  Filled 2020-04-11 (×2): qty 100

## 2020-04-11 MED ORDER — CHLORHEXIDINE GLUCONATE 0.12 % MT SOLN
15.0000 mL | Freq: Once | OROMUCOSAL | Status: AC
  Administered 2020-04-11: 15 mL via OROMUCOSAL

## 2020-04-11 MED ORDER — LIDOCAINE 2% (20 MG/ML) 5 ML SYRINGE
INTRAMUSCULAR | Status: DC | PRN
  Administered 2020-04-11: 100 mg via INTRAVENOUS

## 2020-04-11 MED ORDER — MENTHOL 3 MG MT LOZG: 1.0000 | LOZENGE | OROMUCOSAL | Status: DC | PRN

## 2020-04-11 MED ORDER — SODIUM CHLORIDE 0.9 % IR SOLN
Status: DC | PRN
  Administered 2020-04-11: 1000 mL

## 2020-04-11 MED ORDER — FLEET ENEMA 7-19 GM/118ML RE ENEM: 1.0000 | ENEMA | Freq: Once | RECTAL | Status: DC | PRN

## 2020-04-11 MED ORDER — DEXAMETHASONE SODIUM PHOSPHATE 10 MG/ML IJ SOLN
8.0000 mg | Freq: Once | INTRAMUSCULAR | Status: AC
  Administered 2020-04-11: 8 mg via INTRAVENOUS

## 2020-04-11 MED ORDER — OXYCODONE HCL 5 MG PO TABS: 5.0000 mg | ORAL_TABLET | Freq: Once | ORAL | Status: DC | PRN

## 2020-04-11 MED ORDER — TRAMADOL HCL 50 MG PO TABS
50.0000 mg | ORAL_TABLET | Freq: Four times a day (QID) | ORAL | Status: DC | PRN
  Administered 2020-04-12: 100 mg via ORAL
  Administered 2020-04-12: 50 mg via ORAL
  Filled 2020-04-11: qty 1
  Filled 2020-04-11: qty 2

## 2020-04-11 MED ORDER — HYDROCHLOROTHIAZIDE 25 MG PO TABS
25.0000 mg | ORAL_TABLET | Freq: Every day | ORAL | Status: DC
  Administered 2020-04-12: 25 mg via ORAL
  Filled 2020-04-11: qty 1

## 2020-04-11 MED ORDER — TRANEXAMIC ACID-NACL 1000-0.7 MG/100ML-% IV SOLN
1000.0000 mg | INTRAVENOUS | Status: AC
  Administered 2020-04-11: 1000 mg via INTRAVENOUS
  Filled 2020-04-11: qty 100

## 2020-04-11 MED ORDER — CHLORHEXIDINE GLUCONATE 4 % EX LIQD: 60.0000 mL | Freq: Once | CUTANEOUS | Status: DC

## 2020-04-11 MED ORDER — PHENYLEPHRINE 40 MCG/ML (10ML) SYRINGE FOR IV PUSH (FOR BLOOD PRESSURE SUPPORT)
PREFILLED_SYRINGE | INTRAVENOUS | Status: DC | PRN
  Administered 2020-04-11 (×4): 80 ug via INTRAVENOUS

## 2020-04-11 MED ORDER — METOCLOPRAMIDE HCL 5 MG PO TABS: 5.0000 mg | ORAL_TABLET | Freq: Three times a day (TID) | ORAL | Status: DC | PRN

## 2020-04-11 MED ORDER — ORAL CARE MOUTH RINSE: 15.0000 mL | Freq: Once | OROMUCOSAL | Status: AC

## 2020-04-11 MED ORDER — DIPHENHYDRAMINE HCL 12.5 MG/5ML PO ELIX: 12.5000 mg | ORAL_SOLUTION | ORAL | Status: DC | PRN

## 2020-04-11 MED ORDER — BUPIVACAINE LIPOSOME 1.3 % IJ SUSP
INTRAMUSCULAR | Status: DC | PRN
  Administered 2020-04-11: 20 mL

## 2020-04-11 MED ORDER — DEXAMETHASONE SODIUM PHOSPHATE 10 MG/ML IJ SOLN
10.0000 mg | Freq: Once | INTRAMUSCULAR | Status: AC
  Administered 2020-04-12: 10 mg via INTRAVENOUS
  Filled 2020-04-11: qty 1

## 2020-04-11 MED ORDER — BISACODYL 10 MG RE SUPP: 10.0000 mg | Freq: Every day | RECTAL | Status: DC | PRN

## 2020-04-11 MED ORDER — POVIDONE-IODINE 10 % EX SWAB
2.0000 "application " | Freq: Once | CUTANEOUS | Status: AC
  Administered 2020-04-11: 2 via TOPICAL

## 2020-04-11 MED ORDER — ACETAMINOPHEN 10 MG/ML IV SOLN
1000.0000 mg | Freq: Four times a day (QID) | INTRAVENOUS | Status: DC
  Administered 2020-04-11: 1000 mg via INTRAVENOUS
  Filled 2020-04-11: qty 100

## 2020-04-11 MED ORDER — SODIUM CHLORIDE (PF) 0.9 % IJ SOLN
INTRAMUSCULAR | Status: DC | PRN
  Administered 2020-04-11: 60 mL

## 2020-04-11 MED ORDER — DEXAMETHASONE SODIUM PHOSPHATE 10 MG/ML IJ SOLN
INTRAMUSCULAR | Status: AC
  Filled 2020-04-11: qty 1

## 2020-04-11 MED ORDER — SODIUM CHLORIDE (PF) 0.9 % IJ SOLN
INTRAMUSCULAR | Status: AC
  Filled 2020-04-11: qty 50

## 2020-04-11 MED ORDER — CHOLESTYRAMINE 4 G PO PACK
4.0000 g | PACK | Freq: Every day | ORAL | Status: DC | PRN
  Filled 2020-04-11: qty 1

## 2020-04-11 MED ORDER — MIDAZOLAM HCL 2 MG/2ML IJ SOLN
INTRAMUSCULAR | Status: AC
  Filled 2020-04-11: qty 2

## 2020-04-11 MED ORDER — FENTANYL CITRATE (PF) 100 MCG/2ML IJ SOLN
INTRAMUSCULAR | Status: AC
  Filled 2020-04-11: qty 2

## 2020-04-11 MED ORDER — FENTANYL CITRATE (PF) 100 MCG/2ML IJ SOLN
INTRAMUSCULAR | Status: DC | PRN
  Administered 2020-04-11 (×3): 50 ug via INTRAVENOUS

## 2020-04-11 MED ORDER — FENTANYL CITRATE (PF) 100 MCG/2ML IJ SOLN
50.0000 ug | Freq: Once | INTRAMUSCULAR | Status: AC
  Administered 2020-04-11: 50 ug via INTRAVENOUS
  Filled 2020-04-11: qty 2

## 2020-04-11 MED ORDER — BUPIVACAINE HCL (PF) 0.5 % IJ SOLN
INTRAMUSCULAR | Status: DC | PRN
Start: 2020-04-11 — End: 2020-04-11
  Administered 2020-04-11: 20 mL via PERINEURAL

## 2020-04-11 MED ORDER — CEFAZOLIN SODIUM-DEXTROSE 2-4 GM/100ML-% IV SOLN
2.0000 g | INTRAVENOUS | Status: AC
  Administered 2020-04-11: 2 g via INTRAVENOUS
  Filled 2020-04-11: qty 100

## 2020-04-11 MED ORDER — ONDANSETRON HCL 4 MG/2ML IJ SOLN
INTRAMUSCULAR | Status: AC
  Filled 2020-04-11: qty 2

## 2020-04-11 MED ORDER — OXYCODONE HCL 5 MG/5ML PO SOLN: 5.0000 mg | Freq: Once | ORAL | Status: DC | PRN

## 2020-04-11 MED ORDER — SODIUM CHLORIDE 0.9 % IV SOLN: INTRAVENOUS | Status: DC

## 2020-04-11 MED ORDER — METOCLOPRAMIDE HCL 5 MG/ML IJ SOLN: 5.0000 mg | Freq: Three times a day (TID) | INTRAMUSCULAR | Status: DC | PRN

## 2020-04-11 MED ORDER — MIDAZOLAM HCL 2 MG/2ML IJ SOLN
1.0000 mg | INTRAMUSCULAR | Status: DC
  Administered 2020-04-11: 1 mg via INTRAVENOUS
  Filled 2020-04-11: qty 2

## 2020-04-11 MED ORDER — ONDANSETRON HCL 4 MG PO TABS: 4.0000 mg | ORAL_TABLET | Freq: Four times a day (QID) | ORAL | Status: DC | PRN

## 2020-04-11 MED ORDER — HYDROMORPHONE HCL 1 MG/ML IJ SOLN
0.2500 mg | INTRAMUSCULAR | Status: DC | PRN
  Administered 2020-04-11 (×4): 0.5 mg via INTRAVENOUS

## 2020-04-11 MED ORDER — ONDANSETRON HCL 4 MG/2ML IJ SOLN
INTRAMUSCULAR | Status: DC | PRN
  Administered 2020-04-11: 4 mg via INTRAVENOUS

## 2020-04-11 MED ORDER — ASPIRIN EC 325 MG PO TBEC
325.0000 mg | DELAYED_RELEASE_TABLET | Freq: Two times a day (BID) | ORAL | Status: DC
  Administered 2020-04-12: 325 mg via ORAL
  Filled 2020-04-11: qty 1

## 2020-04-11 MED ORDER — SODIUM CHLORIDE (PF) 0.9 % IJ SOLN
INTRAMUSCULAR | Status: AC
  Filled 2020-04-11: qty 10

## 2020-04-11 MED ORDER — HYDROMORPHONE HCL 1 MG/ML IJ SOLN
INTRAMUSCULAR | Status: AC
  Filled 2020-04-11: qty 2

## 2020-04-11 MED ORDER — DOCUSATE SODIUM 100 MG PO CAPS
100.0000 mg | ORAL_CAPSULE | Freq: Two times a day (BID) | ORAL | Status: DC
  Administered 2020-04-11 – 2020-04-12 (×3): 100 mg via ORAL
  Filled 2020-04-11 (×3): qty 1

## 2020-04-11 MED ORDER — HYDROCODONE-ACETAMINOPHEN 5-325 MG PO TABS
1.0000 | ORAL_TABLET | ORAL | Status: DC | PRN
  Administered 2020-04-11 – 2020-04-12 (×3): 1 via ORAL
  Filled 2020-04-11: qty 1
  Filled 2020-04-11: qty 2
  Filled 2020-04-11: qty 1

## 2020-04-11 MED ORDER — PHENOL 1.4 % MT LIQD: 1.0000 | OROMUCOSAL | Status: DC | PRN

## 2020-04-11 MED ORDER — EPHEDRINE SULFATE-NACL 50-0.9 MG/10ML-% IV SOSY
PREFILLED_SYRINGE | INTRAVENOUS | Status: DC | PRN
  Administered 2020-04-11: 10 mg via INTRAVENOUS

## 2020-04-11 MED ORDER — LORATADINE 10 MG PO TABS: 10.0000 mg | ORAL_TABLET | Freq: Every day | ORAL | Status: DC | PRN

## 2020-04-11 MED ORDER — 0.9 % SODIUM CHLORIDE (POUR BTL) OPTIME
TOPICAL | Status: DC | PRN
  Administered 2020-04-11: 1000 mL

## 2020-04-11 MED ORDER — PROPOFOL 10 MG/ML IV BOLUS
INTRAVENOUS | Status: DC | PRN
  Administered 2020-04-11: 200 mg via INTRAVENOUS

## 2020-04-11 MED ORDER — CYCLOBENZAPRINE HCL 10 MG PO TABS
10.0000 mg | ORAL_TABLET | Freq: Three times a day (TID) | ORAL | Status: DC | PRN
  Administered 2020-04-12: 10 mg via ORAL
  Filled 2020-04-11: qty 1

## 2020-04-11 SURGICAL SUPPLY — 56 items
BAG DECANTER FOR FLEXI CONT (MISCELLANEOUS) ×2 IMPLANT
BAG ZIPLOCK 12X15 (MISCELLANEOUS) IMPLANT
BLADE SAG 18X100X1.27 (BLADE) IMPLANT
BLADE SAW SGTL 11.0X1.19X90.0M (BLADE) IMPLANT
BLADE SURG SZ10 CARB STEEL (BLADE) ×4 IMPLANT
BNDG ELASTIC 6X5.8 VLCR STR LF (GAUZE/BANDAGES/DRESSINGS) ×2 IMPLANT
CLOTH BEACON ORANGE TIMEOUT ST (SAFETY) IMPLANT
CLSR STERI-STRIP ANTIMIC 1/2X4 (GAUZE/BANDAGES/DRESSINGS) ×4 IMPLANT
COVER SURGICAL LIGHT HANDLE (MISCELLANEOUS) ×2 IMPLANT
COVER WAND RF STERILE (DRAPES) ×2 IMPLANT
CUFF TOURN SGL QUICK 34 (TOURNIQUET CUFF) ×2
CUFF TRNQT CYL 34X4.125X (TOURNIQUET CUFF) ×1 IMPLANT
DECANTER SPIKE VIAL GLASS SM (MISCELLANEOUS) ×2 IMPLANT
DRAPE U-SHAPE 47X51 STRL (DRAPES) ×2 IMPLANT
DRESSING AQUACEL AG SP 3.5X10 (GAUZE/BANDAGES/DRESSINGS) ×1 IMPLANT
DRSG ADAPTIC 3X8 NADH LF (GAUZE/BANDAGES/DRESSINGS) ×2 IMPLANT
DRSG AQUACEL AG SP 3.5X10 (GAUZE/BANDAGES/DRESSINGS) ×2
DRSG PAD ABDOMINAL 8X10 ST (GAUZE/BANDAGES/DRESSINGS) ×2 IMPLANT
DURAPREP 26ML APPLICATOR (WOUND CARE) ×2 IMPLANT
ELECT REM PT RETURN 15FT ADLT (MISCELLANEOUS) ×2 IMPLANT
EVACUATOR 1/8 PVC DRAIN (DRAIN) IMPLANT
GAUZE SPONGE 4X4 12PLY STRL (GAUZE/BANDAGES/DRESSINGS) ×2 IMPLANT
GLOVE BIO SURGEON STRL SZ7 (GLOVE) ×2 IMPLANT
GLOVE BIO SURGEON STRL SZ8 (GLOVE) ×2 IMPLANT
GLOVE BIOGEL PI IND STRL 7.0 (GLOVE) ×1 IMPLANT
GLOVE BIOGEL PI IND STRL 8 (GLOVE) ×1 IMPLANT
GLOVE BIOGEL PI INDICATOR 7.0 (GLOVE) ×1
GLOVE BIOGEL PI INDICATOR 8 (GLOVE) ×1
GOWN STRL REUS W/TWL LRG LVL3 (GOWN DISPOSABLE) ×4 IMPLANT
HANDPIECE INTERPULSE COAX TIP (DISPOSABLE) ×2
HOLDER FOLEY CATH W/STRAP (MISCELLANEOUS) IMPLANT
IMMOBILIZER KNEE 20 (SOFTGOODS)
IMMOBILIZER KNEE 20 THIGH 36 (SOFTGOODS) IMPLANT
INSERT TIB LCS RP STD 15 (Knees) ×2 IMPLANT
KIT TURNOVER KIT A (KITS) IMPLANT
MANIFOLD NEPTUNE II (INSTRUMENTS) ×2 IMPLANT
NS IRRIG 1000ML POUR BTL (IV SOLUTION) ×2 IMPLANT
PACK TOTAL KNEE CUSTOM (KITS) ×2 IMPLANT
PADDING CAST COTTON 6X4 STRL (CAST SUPPLIES) ×2 IMPLANT
PENCIL SMOKE EVACUATOR (MISCELLANEOUS) ×2 IMPLANT
PROTECTOR NERVE ULNAR (MISCELLANEOUS) ×2 IMPLANT
SET HNDPC FAN SPRY TIP SCT (DISPOSABLE) ×1 IMPLANT
STRIP CLOSURE SKIN 1/2X4 (GAUZE/BANDAGES/DRESSINGS) ×2 IMPLANT
SUT MNCRL AB 4-0 PS2 18 (SUTURE) ×2 IMPLANT
SUT STRATAFIX 0 PDS 27 VIOLET (SUTURE) ×2
SUT VIC AB 2-0 CT1 27 (SUTURE) ×6
SUT VIC AB 2-0 CT1 TAPERPNT 27 (SUTURE) ×3 IMPLANT
SUTURE STRATFX 0 PDS 27 VIOLET (SUTURE) ×1 IMPLANT
SWAB COLLECTION DEVICE MRSA (MISCELLANEOUS) IMPLANT
SWAB CULTURE ESWAB REG 1ML (MISCELLANEOUS) IMPLANT
SYR 50ML LL SCALE MARK (SYRINGE) ×4 IMPLANT
TOWER CARTRIDGE SMART MIX (DISPOSABLE) IMPLANT
TRAY FOLEY MTR SLVR 16FR STAT (SET/KITS/TRAYS/PACK) ×2 IMPLANT
TUBE KAMVAC SUCTION (TUBING) IMPLANT
WATER STERILE IRR 1000ML POUR (IV SOLUTION) ×2 IMPLANT
WRAP KNEE MAXI GEL POST OP (GAUZE/BANDAGES/DRESSINGS) ×2 IMPLANT

## 2020-04-11 NOTE — Progress Notes (Signed)
Assisted Dr. Rose with right, ultrasound guided, adductor canal block. Side rails up, monitors on throughout procedure. See vital signs in flow sheet. Tolerated Procedure well.  

## 2020-04-11 NOTE — Brief Op Note (Signed)
04/11/2020  9:19 AM  PATIENT:  Jennifer [name]  59 y.o. female  PRE-OPERATIVE DIAGNOSIS:  unstable right total knee arthroplasty  POST-OPERATIVE DIAGNOSIS:  unstable right total knee arthroplasty  PROCEDURE:  Procedure(s) with comments: Right knee polyethylene exchange (Right) - 76min  SURGEON:  Surgeon(s) and Role:    Gaynelle Arabian, MD - Primary  PHYSICIAN ASSISTANT:   ASSISTANTS: Theresa Duty, PA-C   ANESTHESIA:   general and adductor canal block  EBL:  25 ml  BLOOD ADMINISTERED:none  DRAINS: none   LOCAL MEDICATIONS USED:  OTHER Exparel  COUNTS:  YES  TOURNIQUET:   Total Tourniquet Time Documented: Thigh (Right) - 23 minutes Total: Thigh (Right) - 23 minutes   DICTATION: .Other Dictation: Dictation Number [id number]  PLAN OF CARE: Admit for overnight observation  PATIENT DISPOSITION:  PACU - hemodynamically stable.

## 2020-04-11 NOTE — Anesthesia Procedure Notes (Signed)
Procedure Name: LMA Insertion Date/Time: 04/11/2020 8:36 AM Performed by: West Pugh, CRNA Pre-anesthesia Checklist: Patient identified, Emergency Drugs available, Suction available, Patient being monitored and Timeout performed Patient Re-evaluated:Patient Re-evaluated prior to induction Oxygen Delivery Method: Circle system utilized Preoxygenation: Pre-oxygenation with 100% oxygen Induction Type: IV induction LMA: LMA with gastric port inserted LMA Size: 4.0 Number of attempts: 1 Placement Confirmation: positive ETCO2 and breath sounds checked- equal and bilateral Tube secured with: Tape Dental Injury: Teeth and Oropharynx as per pre-operative assessment

## 2020-04-11 NOTE — Anesthesia Procedure Notes (Signed)
Anesthesia Procedure Image    

## 2020-04-11 NOTE — Anesthesia Preprocedure Evaluation (Signed)
Anesthesia Evaluation  Patient identified by MRN, date of birth, ID band Patient awake    Reviewed: Allergy & Precautions, NPO status , Patient's Chart, lab work & pertinent test results  Airway Mallampati: II  TM Distance: >3 FB Neck ROM: Full    Dental no notable dental hx.    Pulmonary neg pulmonary ROS,    Pulmonary exam normal breath sounds clear to auscultation       Cardiovascular hypertension, Normal cardiovascular exam Rhythm:Regular Rate:Normal     Neuro/Psych negative neurological ROS  negative psych ROS   GI/Hepatic negative GI ROS, Neg liver ROS,   Endo/Other  negative endocrine ROS  Renal/GU negative Renal ROS  negative genitourinary   Musculoskeletal negative musculoskeletal ROS (+)   Abdominal   Peds negative pediatric ROS (+)  Hematology negative hematology ROS (+)   Anesthesia Other Findings   Reproductive/Obstetrics negative OB ROS                             Anesthesia Physical Anesthesia Plan  ASA: II  Anesthesia Plan: General   Post-op Pain Management:  Regional for Post-op pain   Induction: Intravenous  PONV Risk Score and Plan: 3 and Ondansetron, Dexamethasone, Treatment may vary due to age or medical condition and Midazolam  Airway Management Planned: LMA  Additional Equipment:   Intra-op Plan:   Post-operative Plan: Extubation in OR  Informed Consent: I have reviewed the patients History and Physical, chart, labs and discussed the procedure including the risks, benefits and alternatives for the proposed anesthesia with the patient or authorized representative who has indicated his/her understanding and acceptance.     Dental advisory given  Plan Discussed with: CRNA and Surgeon  Anesthesia Plan Comments:         Anesthesia Quick Evaluation

## 2020-04-11 NOTE — Op Note (Signed)
NAMESHONYA, [name] MEDICAL RECORD PR:[id number] ACCOUNT [id number] DATE OF BIRTH:1961/06/18 FACILITY: [facility] LOCATION: [facility] PHYSICIAN:Nelton Amsden Zella Ball, MD  OPERATIVE REPORT  DATE OF PROCEDURE:  04/11/2020  PREOPERATIVE DIAGNOSIS:  Unstable right total knee arthroplasty.  POSTOPERATIVE DIAGNOSIS:  Unstable right total knee arthroplasty.  PROCEDURE:  Right knee polyethylene revision.  SURGEON:  Gaynelle Arabian, MD  ASSISTANT:  Theresa Duty, PA-C  ANESTHESIA:  General plus adductor canal block.  ESTIMATED BLOOD LOSS:  Minimal.  DRAIN:  None.  TOURNIQUET TIME:  23 minutes at 300 mmHg.  COMPLICATIONS:  None.  CONDITION:  Stable to recovery.  BRIEF CLINICAL NOTE:  [name] is a 59 year old female who had a right total knee arthroplasty done several years ago.  She has had worsening pain and instability in the knee.  She was evaluated and felt to have instability both in flexion and extension.   She was braced temporarily and the bracing eliminated her pain, but the brace unfortunately, was not comfortable to wear and was very difficult to keep in place.  Given that she improved with bracing, it was felt that instability was the culprit for her  pain and dysfunction.  She presents now for polyethylene versus total knee revision.  PROCEDURE IN DETAIL:  After successful administration of adductor canal block and general anesthetic, a tourniquet was placed high on her right thigh and right lower extremity was prepped and draped in the usual sterile fashion.  Extremity was wrapped in  Esmarch and tourniquet inflated to 300 mmHg.  Midline incision was made with a 10 blade through subcutaneous tissue to the level of the extensor mechanism.  A fresh blade was used to make a medial parapatellar arthrotomy.  Soft tissue over the proximal  medial tibia subperiosteally elevated to the joint line with a knife and into the semimembranosus bursa with a Cobb elevator.  Soft tissue laterally was  elevated with attention being paid to avoid the patellar tendon on tibial tubercle.  Patella is  subluxed laterally as it would not evert easily.  The synovium was then removed with a thorough synovectomy being performed.  I was then able to evert the patella and flex the knee 90 degrees.  I was able to dislocate the tibia anteriorly from the femur  consistent with significant instability in flexion.  There was a 10 mm thick rotating platform polyethylene.  This was the DePuy LCS system with the rotating platform polyethylene.  It was a standard size femur.  We then did trials going up to 15 mm  thick and with a 15, full extension was achieved with excellent 0-degree varus/valgus balance in extension all the way through full flexion and with excellent AP balance in both flexion.  The trial was removed.  The metal components are well aligned.  I  tested for their stability and they were both well fixed.  We then placed the permanent 15 mm standard rotating platform insert into the tibial tray.  The knee was reduced with outstanding stability through full range of motion.  A total of 20 mL of  Exparel mixed with 60 mL of saline was injected into the extensor mechanism periosteum of the femur subcutaneous tissues and the wound copiously irrigated with saline solution.  The extensor mechanism was closed with a running 0 Stratafix suture.   Flexion against gravity was 125 degrees.  Patella tracks normally.  Tourniquet was then released.  Total time of 23 minutes.  Subcutaneous was then closed with interrupted 2-0 Vicryl and subcuticular running  4-0 Monocryl.  Incision is cleaned and dried  and Steri-Strips and a bulky sterile dressing applied.  She was then awakened and transported to recovery in stable condition.  Note that a surgical assistant was a medical necessity for this procedure.  Assistance was necessary for retracting vital ligaments and neurovascular structures, and also for proper positioning of the  limb, for safe removal of the old implant, and safe  and accurate placement of the new implant.  CN/NUANCE  D:04/11/2020 T:04/11/2020 JOB:[id number]

## 2020-04-11 NOTE — Interval H&P Note (Signed)
History and Physical Interval Note:  04/11/2020 6:47 AM  Jennifer [name]  has presented today for surgery, with the diagnosis of unstable right total knee arthroplasty.  The various methods of treatment have been discussed with the patient and family. After consideration of risks, benefits and other options for treatment, the patient has consented to  Procedure(s) with comments: Right knee polyethylene versus total knee arthroplasty revision (Right) - 34min as a surgical intervention.  The patient's history has been reviewed, patient examined, no change in status, stable for surgery.  I have reviewed the patient's chart and labs.  Questions were answered to the patient's satisfaction.     Pilar Plate Dwyne Hasegawa

## 2020-04-11 NOTE — Evaluation (Signed)
Physical Therapy Evaluation Patient Details Name: Jennifer [name] MRN: [id number] DOB: 04-08-1961 Today's Date: 04/11/2020   History of Present Illness  Patient is 59 y.o. female s/p Rt TKR on 04/11/20 with PMH significant for HTN, OA, Rt TKA in 2012, and Lt TKA in 2017, lumbar surgery x2.     Clinical Impression  Jennifer [name] is a 59 y.o. female POD 0 s/p Rt TKR (polyexchange). Patient reports independence with mobility at baseline with use of SPC if pain severe. Patient is now limited by functional impairments (see PT problem list below) and requires min-mod assist for transfers with RW. Patient was able to complete stand step transfer today with RW and mod assist to prevent Rt knee buckling in stance. Pt limited by pain. Patient instructed in exercise to facilitate  circulation. Patient will benefit from continued skilled PT interventions to address impairments and progress towards PLOF. Acute PT will follow to progress mobility and stair training in preparation for safe discharge home.     Follow Up Recommendations Follow surgeon's recommendation for DC plan and follow-up therapies    Equipment Recommendations  None recommended by PT    Recommendations for Other Services       Precautions / Restrictions Precautions Precautions: Fall Restrictions Weight Bearing Restrictions: No      Mobility  Bed Mobility Overal bed mobility: Needs Assistance Bed Mobility: Supine to Sit     Supine to sit: Min assist;HOB elevated     General bed mobility comments: cues to use bed rail, assist for Rt LE mobility to move to EOB.  Transfers Overall transfer level: Needs assistance Equipment used: Rolling walker (2 wheeled) Transfers: Sit to/from [facility] Sit to Stand: Min assist Stand pivot transfers: Mod assist       General transfer comment: cues for safe technique/hand placement with RW. pt stood and ~30 sec later felt "woozy" and required seated rest. BP stable and  symptoms resolved. Pt requested to attempt moving to chair. Mod assist for stand step transfer with blocking Rt knee to stabilize and manage walker position.   Ambulation/Gait         Stairs     Wheelchair Mobility    Modified Rankin (Stroke Patients Only)       Balance Overall balance assessment: Needs assistance Sitting-balance support: Feet supported Sitting balance-Leahy Scale: Good     Standing balance support: During functional activity;Bilateral upper extremity supported Standing balance-Leahy Scale: Poor            Pertinent Vitals/Pain Pain Assessment: 0-10 Pain Score: 6  Pain Location: Rt knee Pain Descriptors / Indicators: Aching;Discomfort;Grimacing;Guarding Pain Intervention(s): Limited activity within patient's tolerance;Monitored during session;Premedicated before session;Repositioned;Ice applied    Home Living Family/patient expects to be discharged to:: Private residence Living Arrangements: Children;Alone Available Help at Discharge: Family Type of Home: House Home Access: Stairs to enter Entrance Stairs-Rails: None Entrance Stairs-Number of Steps: 1 Home Layout: One level Home Equipment: Bedside commode;Walker - 2 wheels;Cane - single point;Shower seat      Prior Function Level of Independence: Independent;Independent with assistive device(s)         Comments: used SPC when pain too high     Hand Dominance   Dominant Hand: Right    Extremity/Trunk Assessment   Upper Extremity Assessment Upper Extremity Assessment: Overall WFL for tasks assessed    Lower Extremity Assessment Lower Extremity Assessment: RLE deficits/detail RLE Deficits / Details: limited by pain, manual block of Rt LE RLE: Unable to fully assess  due to pain RLE Sensation: WNL RLE Coordination: WNL    Cervical / Trunk Assessment Cervical / Trunk Assessment: Normal  Communication   Communication: No difficulties  Cognition Arousal/Alertness:  Awake/alert Behavior During Therapy: WFL for tasks assessed/performed Overall Cognitive Status: Within Functional Limits for tasks assessed         General Comments      Exercises Total Joint Exercises Ankle Circles/Pumps: AROM;Both;Seated;15 reps   Assessment/Plan    PT Assessment Patient needs continued PT services  PT Problem List Decreased strength;Decreased range of motion;Decreased activity tolerance;Decreased balance;Decreased mobility;Decreased knowledge of use of DME;Pain       PT Treatment Interventions Gait training;DME instruction;Stair training;Functional mobility training;Therapeutic exercise;Therapeutic activities;Balance training;Patient/family education    PT Goals (Current goals can be found in the Care Plan section)  Acute Rehab PT Goals Patient Stated Goal: recover and get home PT Goal Formulation: With patient Time For Goal Achievement: 04/18/20 Potential to Achieve Goals: Good    Frequency 7X/week    AM-PAC PT "6 Clicks" Mobility  Outcome Measure Help needed turning from your back to your side while in a flat bed without using bedrails?: A Little Help needed moving from lying on your back to sitting on the side of a flat bed without using bedrails?: A Little Help needed moving to and from a bed to a chair (including a wheelchair)?: A Lot Help needed standing up from a chair using your arms (e.g., wheelchair or bedside chair)?: A Little Help needed to walk in hospital room?: A Lot Help needed climbing 3-5 steps with a railing? : A Lot 6 Click Score: 15    End of Session Equipment Utilized During Treatment: Gait belt Activity Tolerance: Patient limited by pain Patient left: in chair;with call bell/phone within reach;with chair alarm set Nurse Communication: Mobility status PT Visit Diagnosis: Muscle weakness (generalized) (M62.81);Difficulty in walking, not elsewhere classified (R26.2);Pain Pain - Right/Left: Right Pain - part of body: Knee     Time: 8270-7867 PT Time Calculation (min) (ACUTE ONLY): 19 min   Charges:   PT Evaluation $PT Eval Low Complexity: 1 Low         Verner Mould, DPT Acute Rehabilitation Services  Office [phone] Pager [phone]  04/11/2020 5:53 PM

## 2020-04-11 NOTE — Progress Notes (Signed)
Orthopedic Tech Progress Note Patient Details:  Jennifer [name] 1961-01-20 [id number]  CPM Right Knee CPM Right Knee: Off Right Knee Flexion (Degrees): 40 Right Knee Extension (Degrees): 10     [place] Pink 04/11/2020, 2:17 PM

## 2020-04-11 NOTE — Transfer of Care (Signed)
Immediate Anesthesia Transfer of Care Note  Patient: Jennifer [name]  Procedure(s) Performed: Right knee polyethylene exchange (Right Knee)  Patient Location: PACU  Anesthesia Type:GA combined with regional for post-op pain  Level of Consciousness: awake, drowsy and patient cooperative  Airway & Oxygen Therapy: Patient Spontanous Breathing and Patient connected to face mask oxygen  Post-op Assessment: Report given to RN and Post -op Vital signs reviewed and stable  Post vital signs: Reviewed and stable  Last Vitals:  Vitals Value Taken Time  BP 168/90 04/11/20 0947  Temp    Pulse 76 04/11/20 0949  Resp 18 04/11/20 0949  SpO2 100 % 04/11/20 0949  Vitals shown include unvalidated device data.  Last Pain:  Vitals:   04/11/20 0646  TempSrc: Oral         Complications: No complications documented.

## 2020-04-11 NOTE — Discharge Instructions (Addendum)
 Frank Aluisio, MD Total Joint Specialist [facility] [place] [address] [place], [place] [address] [phone]  TOTAL KNEE REVISION POSTOPERATIVE DIRECTIONS  Knee Rehabilitation, Guidelines Following Surgery  Results after knee surgery are often greatly improved when you follow the exercise, range of motion and muscle strengthening exercises prescribed by your doctor. Safety measures are also important to protect the knee from further injury. If any of these exercises cause you to have increased pain or swelling in your knee joint, decrease the amount until you are comfortable again and slowly increase them. If you have problems or questions, call your caregiver or physical therapist for advice.   BLOOD CLOT PREVENTION . Take a 325 mg Aspirin two times a day for three weeks following surgery. Then take an 81 mg Aspirin once a day for three weeks. Then discontinue Aspirin. . You may resume your vitamins/supplements upon discharge from the hospital. . Do not take any NSAIDs (Advil, Aleve, Ibuprofen, Meloxicam, etc.) until you have discontinued the 325 mg Aspirin.  HOME CARE INSTRUCTIONS  . Remove items at home which could result in a fall. This includes throw rugs or furniture in walking pathways.  . ICE to the affected knee as much as tolerated. Icing helps control swelling. If the swelling is well controlled you will be more comfortable and rehab easier. Continue to use ice on the knee for pain and swelling from surgery. You may notice swelling that will progress down to the foot and ankle. This is normal after surgery. Elevate the leg when you are not up walking on it.    . Continue to use the breathing machine which will help keep your temperature down. It is common for your temperature to cycle up and down following surgery, especially at night when you are not up moving around and exerting yourself. The breathing machine keeps your lungs expanded and your  temperature down. . Do not place pillow under the operative knee, focus on keeping the knee straight while resting  DIET You may resume your previous home diet once you are discharged from the hospital.  DRESSING / WOUND CARE / SHOWERING . Keep your bulky bandage on for 2 days. On the third post-operative day you may remove the Ace bandage and gauze. There is a waterproof adhesive bandage on your skin which will stay in place until your first follow-up appointment. Once you remove this you will not need to place another bandage . You may begin showering 3 days following surgery, but do not submerge the incision under water.  ACTIVITY For the first 5 days, the key is rest and control of pain and swelling . Do your home exercises twice a day starting on post-operative day 3. On the days you go to physical therapy, just do the home exercises once that day. . You should rest, ice and elevate the leg for 50 minutes out of every hour. Get up and walk/stretch for 10 minutes per hour. After 5 days you can increase your activity slowly as tolerated. . Walk with your walker as instructed. Use the walker until you are comfortable transitioning to a cane. Walk with the cane in the opposite hand of the operative leg. You may discontinue the cane once you are comfortable and walking steadily. . Avoid periods of inactivity such as sitting longer than an hour when not asleep. This helps prevent blood clots.  . You may discontinue the knee immobilizer once you are able to perform a straight leg raise   while lying down. . You may resume a sexual relationship in one month or when given the OK by your doctor.  . You may return to work once you are cleared by your doctor.  . Do not drive a car for 6 weeks or until released by your surgeon.  . Do not drive while taking narcotics.  TED HOSE STOCKINGS Wear the elastic stockings on both legs for three weeks following surgery during the day. You may remove them at night  for sleeping.  WEIGHT BEARING Weight bearing as tolerated with assist device (walker, cane, etc) as directed, use it as long as suggested by your surgeon or therapist, typically at least 4-6 weeks.  POSTOPERATIVE CONSTIPATION PROTOCOL Constipation - defined medically as fewer than three stools per week and severe constipation as less than one stool per week.  One of the most common issues patients have following surgery is constipation.  Even if you have a regular bowel pattern at home, your normal regimen is likely to be disrupted due to multiple reasons following surgery.  Combination of anesthesia, postoperative narcotics, change in appetite and fluid intake all can affect your bowels.  In order to avoid complications following surgery, here are some recommendations in order to help you during your recovery period.  . Colace (docusate) - Pick up an over-the-counter form of Colace or another stool softener and take twice a day as long as you are requiring postoperative pain medications.  Take with a full glass of water daily.  If you experience loose stools or diarrhea, hold the colace until you stool forms back up. If your symptoms do not get better within 1 week or if they get worse, check with your doctor. . Dulcolax (bisacodyl) - Pick up over-the-counter and take as directed by the product packaging as needed to assist with the movement of your bowels.  Take with a full glass of water.  Use this product as needed if not relieved by Colace only.  . MiraLax (polyethylene glycol) - Pick up over-the-counter to have on hand. MiraLax is a solution that will increase the amount of water in your bowels to assist with bowel movements.  Take as directed and can mix with a glass of water, juice, soda, coffee, or tea. Take if you go more than two days without a movement. Do not use MiraLax more than once per day. Call your doctor if you are still constipated or irregular after using this medication for 7 days  in a row.  If you continue to have problems with postoperative constipation, please contact the office for further assistance and recommendations.  If you experience "the worst abdominal pain ever" or develop nausea or vomiting, please contact the office immediatly for further recommendations for treatment.  ITCHING If you experience itching with your medications, try taking only a single pain pill, or even half a pain pill at a time.  You can also use Benadryl over the counter for itching or also to help with sleep.   MEDICATIONS See your medication summary on the "After Visit Summary" that the nursing staff will review with you prior to discharge.  You may have some home medications which will be placed on hold until you complete the course of blood thinner medication.  It is important for you to complete the blood thinner medication as prescribed by your surgeon.  Continue your approved medications as instructed at time of discharge.  PRECAUTIONS . If you experience chest pain or shortness of breath -   call 911 immediately for transfer to the hospital emergency department.  . If you develop a fever greater that 101 F, purulent drainage from wound, increased redness or drainage from wound, foul odor from the wound/dressing, or calf pain - CONTACT YOUR SURGEON.                                                   FOLLOW-UP APPOINTMENTS Make sure you keep all of your appointments after your operation with your surgeon and caregivers. You should call the office at the above phone number and make an appointment for approximately two weeks after the date of your surgery or on the date instructed by your surgeon outlined in the "After Visit Summary".  RANGE OF MOTION AND STRENGTHENING EXERCISES  Rehabilitation of the knee is important following a knee injury or an operation. After just a few days of immobilization, the muscles of the thigh which control the knee become weakened and shrink (atrophy). Knee  exercises are designed to build up the tone and strength of the thigh muscles and to improve knee motion. Often times heat used for twenty to thirty minutes before working out will loosen up your tissues and help with improving the range of motion but do not use heat for the first two weeks following surgery. These exercises can be done on a training (exercise) mat, on the floor, on a table or on a bed. Use what ever works the best and is most comfortable for you Knee exercises include:  . Leg Lifts - While your knee is still immobilized in a splint or cast, you can do straight leg raises. Lift the leg to 60 degrees, hold for 3 sec, and slowly lower the leg. Repeat 10-20 times 2-3 times daily. Perform this exercise against resistance later as your knee gets better.  . Quad and Hamstring Sets - Tighten up the muscle on the front of the thigh (Quad) and hold for 5-10 sec. Repeat this 10-20 times hourly. Hamstring sets are done by pushing the foot backward against an object and holding for 5-10 sec. Repeat as with quad sets.   Leg Slides: Lying on your back, slowly slide your foot toward your buttocks, bending your knee up off the floor (only go as far as is comfortable). Then slowly slide your foot back down until your leg is flat on the floor again.  Angel Wings: Lying on your back spread your legs to the side as far apart as you can without causing discomfort.  A rehabilitation program following serious knee injuries can speed recovery and prevent re-injury in the future due to weakened muscles. Contact your doctor or a physical therapist for more information on knee rehabilitation.   IF YOU ARE TRANSFERRED TO A SKILLED REHAB FACILITY If the patient is transferred to a skilled rehab facility following release from the hospital, a list of the current medications will be sent to the facility for the patient to continue.  When discharged from the skilled rehab facility, please have the facility set up the  patient's Home Health Physical Therapy prior to being released. Also, the skilled facility will be responsible for providing the patient with their medications at time of release from the facility to include their pain medication, the muscle relaxants, and their blood thinner medication. If the patient is still at the rehab facility   at time of the two week follow up appointment, the skilled rehab facility will also need to assist the patient in arranging follow up appointment in our office and any transportation needs.  MAKE SURE YOU:  . Understand these instructions.  . Get help right away if you are not doing well or get worse.   DENTAL ANTIBIOTICS:  In most cases prophylactic antibiotics for Dental procdeures after total joint surgery are not necessary.  Exceptions are as follows:  1. History of prior total joint infection  2. Severely immunocompromised (Organ Transplant, cancer chemotherapy, Rheumatoid biologic meds such as Humera)  3. Poorly controlled diabetes (A1C &gt; 8.0, blood glucose over 200)  If you have one of these conditions, contact your surgeon for an antibiotic prescription, prior to your dental procedure.    Pick up stool softner and laxative for home use following surgery while on pain medications. Do not submerge incision under water. Please use good hand washing techniques while changing dressing each day. May shower starting three days after surgery. Please use a clean towel to pat the incision dry following showers. Continue to use ice for pain and swelling after surgery. Do not use any lotions or creams on the incision until instructed by your surgeon.  

## 2020-04-11 NOTE — Progress Notes (Signed)
Orthopedic Tech Progress Note Patient Details:  Jennifer [name] December 13, 1960 [id number]  CPM Right Knee CPM Right Knee: On Right Knee Flexion (Degrees): 40 Right Knee Extension (Degrees): [place] Dagny Fiorentino 04/11/2020, 10:21 AM

## 2020-04-11 NOTE — Anesthesia Postprocedure Evaluation (Signed)
Anesthesia Post Note  Patient: Jennifer [name]  Procedure(s) Performed: Right knee polyethylene exchange (Right Knee)     Patient location during evaluation: PACU Anesthesia Type: General Level of consciousness: awake and alert Pain management: pain level controlled Vital Signs Assessment: post-procedure vital signs reviewed and stable Respiratory status: spontaneous breathing, nonlabored ventilation, respiratory function stable and patient connected to nasal cannula oxygen Cardiovascular status: blood pressure returned to baseline and stable Postop Assessment: no apparent nausea or vomiting Anesthetic complications: no   No complications documented.  Last Vitals:  Vitals:   04/11/20 1313 04/11/20 1416  BP: 132/74 (!) 122/109  Pulse: 66 71  Resp: 17 18  Temp:  36.6 C  SpO2: 96% 100%    Last Pain:  Vitals:   04/11/20 1320  TempSrc:   PainSc: Asleep                 Raymon Schlarb S

## 2020-04-11 NOTE — Anesthesia Procedure Notes (Signed)
Anesthesia Regional Block: Adductor canal block   Pre-Anesthetic Checklist: ,, timeout performed, Correct Patient, Correct Site, Correct Laterality, Correct Procedure, Correct Position, site marked, Risks and benefits discussed,  Surgical consent,  Pre-op evaluation,  At surgeon's request and post-op pain management  Laterality: Right  Prep: chloraprep       Needles:  Injection technique: Single-shot  Needle Type: Echogenic Needle     Needle Length: 9cm      Additional Needles:   Procedures:,,,, ultrasound used (permanent image in chart),,,,  Narrative:  Start time: 04/11/2020 8:17 AM End time: 04/11/2020 8:22 AM Injection made incrementally with aspirations every 5 mL.  Performed by: Personally  Anesthesiologist: Myrtie Soman, MD  Additional Notes: Patient tolerated the procedure well without complications

## 2020-04-12 ENCOUNTER — Encounter (HOSPITAL_COMMUNITY): Payer: Self-pay | Admitting: Orthopedic Surgery

## 2020-04-12 LAB — CBC
HCT: 40.1 % (ref 36.0–46.0)
Hemoglobin: 13.2 g/dL (ref 12.0–15.0)
MCH: 26.9 pg (ref 26.0–34.0)
MCHC: 32.9 g/dL (ref 30.0–36.0)
MCV: 81.7 fL (ref 80.0–100.0)
Platelets: 307 10*3/uL (ref 150–400)
RBC: 4.91 MIL/uL (ref 3.87–5.11)
RDW: 14.6 % (ref 11.5–15.5)
WBC: 16.7 10*3/uL — ABNORMAL HIGH (ref 4.0–10.5)
nRBC: 0 % (ref 0.0–0.2)

## 2020-04-12 LAB — BASIC METABOLIC PANEL
Anion gap: 9 (ref 5–15)
BUN: 15 mg/dL (ref 6–20)
CO2: 26 mmol/L (ref 22–32)
Calcium: 8.3 mg/dL — ABNORMAL LOW (ref 8.9–10.3)
Chloride: 103 mmol/L (ref 98–111)
Creatinine, Ser: 0.84 mg/dL (ref 0.44–1.00)
GFR calc Af Amer: >60 mL/min (ref >60)
GFR calc non Af Amer: >60 mL/min (ref >60)
Glucose, Bld: 112 mg/dL — ABNORMAL HIGH (ref 70–99)
Potassium: 4.4 mmol/L (ref 3.5–5.1)
Sodium: 138 mmol/L (ref 135–145)

## 2020-04-12 MED ORDER — ASPIRIN 325 MG PO TBEC: 325.0000 mg | DELAYED_RELEASE_TABLET | Freq: Two times a day (BID) | ORAL | 0 refills | Status: AC

## 2020-04-12 MED ORDER — HYDROCODONE-ACETAMINOPHEN 5-325 MG PO TABS: 1.0000 | ORAL_TABLET | Freq: Four times a day (QID) | ORAL | 0 refills | Status: DC | PRN

## 2020-04-12 MED ORDER — CYCLOBENZAPRINE HCL 10 MG PO TABS: 10.0000 mg | ORAL_TABLET | Freq: Three times a day (TID) | ORAL | 0 refills | Status: DC | PRN

## 2020-04-12 NOTE — Plan of Care (Signed)
All discharge instructions were given to Pt. All questions were answered.

## 2020-04-12 NOTE — Progress Notes (Signed)
Physical Therapy Treatment Patient Details Name: Jennifer [name] MRN: [id number] DOB: Nov 11, 1960 Today's Date: 04/12/2020    History of Present Illness Patient is 59 y.o. female s/p Rt TKA polyethylene exchange on 04/11/20 with PMH significant for HTN, OA, Rt TKA in 2012, and Lt TKA in 2017, lumbar surgery x2.    PT Comments    Pt has met PT goals and is ready to DC home from PT standpoint. She ambulated 180' with RW, completed stair training, and demonstrates good understanding of HEP.    Follow Up Recommendations  Follow surgeon's recommendation for DC plan and follow-up therapies     Equipment Recommendations  None recommended by PT    Recommendations for Other Services       Precautions / Restrictions Precautions Precautions: Fall Restrictions Weight Bearing Restrictions: No Other Position/Activity Restrictions: WBAT    Mobility  Bed Mobility               General bed mobility comments: up in recliner  Transfers Overall transfer level: Needs assistance Equipment used: Rolling walker (2 wheeled) Transfers: Sit to/from Stand Sit to Stand: Min guard         General transfer comment: VCs hand placement  Ambulation/Gait Ambulation/Gait assistance: Supervision Gait Distance (Feet): 180 Feet Assistive device: Rolling walker (2 wheeled) Gait Pattern/deviations: Step-to pattern Gait velocity: WFL   General Gait Details: VCs sequencing, no loss of balance   Stairs Stairs: Yes Stairs assistance: Min guard Stair Management: Two rails;Step to pattern;Forwards Number of Stairs: 3 General stair comments: VCs sequencing, min A for safety   Wheelchair Mobility    Modified Rankin (Stroke Patients Only)       Balance Overall balance assessment: Needs assistance Sitting-balance support: Feet supported Sitting balance-Leahy Scale: Good     Standing balance support: During functional activity;Bilateral upper extremity supported Standing balance-Leahy  Scale: Poor                              Cognition Arousal/Alertness: Awake/alert Behavior During Therapy: WFL for tasks assessed/performed Overall Cognitive Status: Within Functional Limits for tasks assessed                                        Exercises Total Joint Exercises Ankle Circles/Pumps: AROM;Both;Seated;15 reps Quad Sets: AROM;Both;5 reps;Supine Short Arc Quad: AROM;Right;10 reps;Supine Heel Slides: AAROM;Right;10 reps;Supine Hip ABduction/ADduction: AROM;Right;10 reps;Supine Straight Leg Raises: AROM;Right;5 reps;Supine Long Arc Quad: AROM;Right;5 reps;Seated Knee Flexion: AAROM;AROM;Right;10 reps;Seated Goniometric ROM: 5-75* AAROM R knee    General Comments        Pertinent Vitals/Pain Pain Score: 4  Pain Location: Rt knee Pain Descriptors / Indicators: Sore Pain Intervention(s): Limited activity within patient's tolerance;Monitored during session;Premedicated before session;Ice applied    Home Living                      Prior Function            PT Goals (current goals can now be found in the care plan section) Acute Rehab PT Goals Patient Stated Goal: recover and get home PT Goal Formulation: With patient Time For Goal Achievement: 04/18/20 Potential to Achieve Goals: Good Progress towards PT goals: Goals met/education completed, patient discharged from PT    Frequency    7X/week      PT Plan  Co-evaluation              AM-PAC PT "6 Clicks" Mobility   Outcome Measure  Help needed turning from your back to your side while in a flat bed without using bedrails?: None Help needed moving from lying on your back to sitting on the side of a flat bed without using bedrails?: None Help needed moving to and from a bed to a chair (including a wheelchair)?: None Help needed standing up from a chair using your arms (e.g., wheelchair or bedside chair)?: None Help needed to walk in hospital room?:  None Help needed climbing 3-5 steps with a railing? : None 6 Click Score: 24    End of Session Equipment Utilized During Treatment: Gait belt Activity Tolerance: Patient limited by pain Patient left: in chair;with call bell/phone within reach;with chair alarm set Nurse Communication: Mobility status PT Visit Diagnosis: Muscle weakness (generalized) (M62.81);Difficulty in walking, not elsewhere classified (R26.2);Pain Pain - Right/Left: Right Pain - part of body: Knee     Time: 0924-1000 PT Time Calculation (min) (ACUTE ONLY): 36 min  Charges:  $Gait Training: 8-22 mins $Therapeutic Exercise: 8-22 mins                     Blondell Reveal Kistler PT 04/12/2020  Acute Rehabilitation Services Pager [phone] Office [phone]

## 2020-04-12 NOTE — Progress Notes (Signed)
   Subjective: 1 Day Post-Op Procedure(s) (LRB): Right knee polyethylene exchange (Right) Patient reports pain as mild.   Patient seen in rounds with Dr. Wynelle Link. Patient is well, and has had no acute complaints or problems. States she is ready to go home. Denies chest pain, SOB, or calf pain. Voiding without difficulty. No issues overnight.  We will continue therapy today.   Objective: Vital signs in last 24 hours: Temp:  [96.8 F (36 C)-98.4 F (36.9 C)] 98 F (36.7 C) (07/01 0417) Pulse Rate:  [60-90] 60 (07/01 0417) Resp:  [12-21] 16 (07/01 0417) BP: (115-168)/(64-109) 119/65 (07/01 0417) SpO2:  [96 %-100 %] 100 % (07/01 0417) Weight:  [90.8 kg] 90.8 kg (06/30 1703)  Intake/Output from previous day:  Intake/Output Summary (Last 24 hours) at 04/12/2020 0741 Last data filed at 04/12/2020 0600 Gross per 24 hour  Intake 2202.39 ml  Output 1700 ml  Net 502.39 ml     Intake/Output this shift: No intake/output data recorded.  Labs: Recent Labs    04/12/20 0235  HGB 13.2   Recent Labs    04/12/20 0235  WBC 16.7*  RBC 4.91  HCT 40.1  PLT 307   Recent Labs    04/12/20 0235  NA 138  K 4.4  CL 103  CO2 26  BUN 15  CREATININE 0.84  GLUCOSE 112*  CALCIUM 8.3*   No results for input(s): LABPT, INR in the last 72 hours.  Exam: General - Patient is Alert and Oriented Extremity - Neurologically intact Neurovascular intact Sensation intact distally Dorsiflexion/Plantar flexion intact Dressing - dressing C/D/I Motor Function - intact, moving foot and toes well on exam.   Past Medical History:  Diagnosis Date  . Arthritis    arthritis back and knees  . Blood transfusion 05/2011  . Hypertension   . IBS (irritable bowel syndrome)   . Transfusion history    s/p 2012 RTKA ([facility])    Assessment/Plan: 1 Day Post-Op Procedure(s) (LRB): Right knee polyethylene exchange (Right) Principal Problem:   Failed total right knee replacement (HCC)  Estimated body mass  index is 33.31 kg/m as calculated from the following:   Height as of this encounter: 5\' 5"  (1.651 m).   Weight as of this encounter: 90.8 kg. Advance diet Up with therapy D/C IV fluids  DVT Prophylaxis - Aspirin Weight bearing as tolerated. Continue therapy.  Plan is to go Home after hospital stay. Plan for discharge later today if progresses with therapy and is meeting her goals. Scheduled for OPPT at [facility]. Follow-up in the office July 15.  The PDMP database was reviewed today prior to any opioid medications being prescribed to this patient.   Theresa Duty, PA-C Orthopedic Surgery [phone] 04/12/2020, 7:41 AM

## 2020-04-12 NOTE — TOC Transition Note (Signed)
Transition of Care [facility]) - CM/SW Discharge Note   Patient Details  Name: Jennifer [name] MRN: [id number] Date of Birth: [birth year]  Transition of Care (TOC) CM/SW Contact:  Lennart Pall, LCSW Phone Number: 04/12/2020, 9:22 AM   Clinical Narrative:   Met briefly with pt and confirming no TOC needs.  See below for info.    Final next level of care: OP Rehab Barriers to Discharge: No Barriers Identified   Patient Goals and CMS Choice        Discharge Placement                       Discharge Plan and Services                DME Arranged: N/A (has all needed DME) DME Agency: NA       HH Arranged: NA (plan for OPPT at [facility]) [place]: NA        Social Determinants of Health ([place]) Interventions     Readmission Risk Interventions Readmission Risk Prevention Plan 04/12/2020  Medication Screening Complete  Transportation Screening Complete  Some recent data might be hidden

## 2020-04-17 DIAGNOSIS — M25661 Stiffness of right knee, not elsewhere classified: Secondary | ICD-10-CM | POA: Insufficient documentation

## 2020-04-18 NOTE — Discharge Summary (Signed)
Physician Discharge Summary   Patient ID: Jennifer [name] MRN: [id number] DOB/AGE: 1961/08/19 59 y.o.  Admit date: 04/11/2020 Discharge date: 04/12/2020  Primary Diagnosis: Unstable right total knee arthroplasty   Admission Diagnoses:  Past Medical History:  Diagnosis Date  . Arthritis    arthritis back and knees  . Blood transfusion 05/2011  . Hypertension   . IBS (irritable bowel syndrome)   . Transfusion history    s/p 2012 RTKA [facility])   Discharge Diagnoses:   Principal Problem:   Failed total right knee replacement ([place])  Estimated body mass index is 33.31 kg/m as calculated from the following:   Height as of this encounter: 5\' 5"  (1.651 m).   Weight as of this encounter: 90.8 kg.  Procedure:  Procedure(s) (LRB): Right knee polyethylene exchange (Right)   Consults: None  HPI: Jennifer [name] is a 59 year old female who had a right total knee arthroplasty done several years ago.  She has had worsening pain and instability in the knee.  She was evaluated and felt to have instability both in flexion and extension.  She was braced temporarily and the bracing eliminated her pain, but the brace unfortunately, was not comfortable to wear and was very difficult to keep in place.  Given that she improved with bracing, it was felt that instability was the culprit for her pain and dysfunction.  She presents now for polyethylene versus total knee revision.  Laboratory Data: Admission on 04/11/2020, Discharged on 04/12/2020  Component Date Value Ref Range Status  . WBC 04/12/2020 16.7* 4.0 - 10.5 K/uL Final  . RBC 04/12/2020 4.91  3.87 - 5.11 MIL/uL Final  . Hemoglobin 04/12/2020 13.2  12.0 - 15.0 g/dL Final  . HCT 04/12/2020 40.1  36 - 46 % Final  . MCV 04/12/2020 81.7  80.0 - 100.0 fL Final  . MCH 04/12/2020 26.9  26.0 - 34.0 pg Final  . MCHC 04/12/2020 32.9  30.0 - 36.0 g/dL Final  . RDW 04/12/2020 14.6  11.5 - 15.5 % Final  . Platelets 04/12/2020 307  150 - 400 K/uL Final  . nRBC  04/12/2020 0.0  0.0 - 0.2 % Final   Performed at [facility], [place] [address], [place], [address] [address]  . Sodium 04/12/2020 138  135 - 145 mmol/L Final  . Potassium 04/12/2020 4.4  3.5 - 5.1 mmol/L Final  . Chloride 04/12/2020 103  98 - 111 mmol/L Final  . CO2 04/12/2020 26  22 - 32 mmol/L Final  . Glucose, Bld 04/12/2020 112* 70 - 99 mg/dL Final   Glucose reference range applies only to samples taken after fasting for at least 8 hours.  . BUN 04/12/2020 15  6 - 20 mg/dL Final  . Creatinine, Ser 04/12/2020 0.84  0.44 - 1.00 mg/dL Final  . Calcium 04/12/2020 8.3* 8.9 - 10.3 mg/dL Final  . GFR calc non Af Amer 04/12/2020 >60  >60 mL/min Final  . GFR calc Af Amer 04/12/2020 >60  >60 mL/min Final  . Anion gap 04/12/2020 9  5 - 15 Final   Performed at [facility], [place] [address], [place], [place] [address]  Hospital Outpatient Visit on 04/07/2020  Component Date Value Ref Range Status  . SARS Coronavirus 2 04/07/2020 NEGATIVE  NEGATIVE Final   Comment: (NOTE) SARS-CoV-2 target nucleic acids are NOT DETECTED.  The SARS-CoV-2 RNA is generally detectable in upper and lower respiratory specimens during the acute phase of infection. Negative results do not preclude SARS-CoV-2 infection,  do not rule out co-infections with other pathogens, and should not be used as the sole basis for treatment or other patient management decisions. Negative results must be combined with clinical observations, patient history, and epidemiological information. The expected result is Negative.  Fact Sheet for Patients: SugarRoll.be  Fact Sheet for Healthcare Providers: https://www.woods-mathews.com/  This test is not yet approved or cleared by the [place] FDA and  has been authorized for detection and/or diagnosis of SARS-CoV-2 by FDA under an Emergency Use Authorization (EUA). This EUA will remain  in effect (meaning this  test can be used) for the duration of the COVID-19 declaration under Se                          ction 564(b)(1) of the Act, 21 U.S.C. section 360bbb-3(b)(1), unless the authorization is terminated or revoked sooner.  Performed at [facility], [place] [address], [place], [place] [address]   Hospital Outpatient Visit on 04/02/2020  Component Date Value Ref Range Status  . MRSA, PCR 04/02/2020 NEGATIVE  NEGATIVE Final  . Staphylococcus aureus 04/02/2020 NEGATIVE  NEGATIVE Final   Comment: (NOTE) The Xpert SA Assay (FDA approved for NASAL specimens in patients 10 years of age and older), is one component of a comprehensive surveillance program. It is not intended to diagnose infection nor to guide or monitor treatment. Performed at [facility], [place] [address], [place], [place] [address]   . aPTT 04/02/2020 38* 24 - 36 seconds Final   Comment:        IF BASELINE aPTT IS ELEVATED, SUGGEST PATIENT RISK ASSESSMENT BE USED TO DETERMINE APPROPRIATE ANTICOAGULANT THERAPY. Performed at [facility], [place] [address] [place], [place] [address]   . WBC 04/02/2020 7.2  4.0 - 10.5 K/uL Final  . RBC 04/02/2020 5.72* 3.87 - 5.11 MIL/uL Final  . Hemoglobin 04/02/2020 15.4* 12.0 - 15.0 g/dL Final  . HCT 04/02/2020 48.1* 36 - 46 % Final  . MCV 04/02/2020 84.1  80.0 - 100.0 fL Final  . MCH 04/02/2020 26.9  26.0 - 34.0 pg Final  . MCHC 04/02/2020 32.0  30.0 - 36.0 g/dL Final  . RDW 04/02/2020 14.6  11.5 - 15.5 % Final  . Platelets 04/02/2020 302  150 - 400 K/uL Final  . nRBC 04/02/2020 0.0  0.0 - 0.2 % Final   Performed at [facility], [place] [address], [place], [place] [address]  . Sodium 04/02/2020 142  135 - 145 mmol/L Final  . Potassium 04/02/2020 3.8  3.5 - 5.1 mmol/L Final  . Chloride 04/02/2020 107  98 - 111 mmol/L Final  . CO2 04/02/2020 25  22 - 32 mmol/L Final  . Glucose, Bld 04/02/2020 91  70 - 99 mg/dL Final   Glucose  reference range applies only to samples taken after fasting for at least 8 hours.  . BUN 04/02/2020 15  6 - 20 mg/dL Final  . Creatinine, Ser 04/02/2020 0.89  0.44 - 1.00 mg/dL Final  . Calcium 04/02/2020 8.8* 8.9 - 10.3 mg/dL Final  . Total Protein 04/02/2020 7.5  6.5 - 8.1 g/dL Final  . Albumin 04/02/2020 4.1  3.5 - 5.0 g/dL Final  . AST 04/02/2020 29  15 - 41 U/L Final  . ALT 04/02/2020 24  0 - 44 U/L Final  . Alkaline Phosphatase 04/02/2020 104  38 - 126 U/L Final  . Total Bilirubin 04/02/2020 0.8  0.3 -  1.2 mg/dL Final  . GFR calc non Af Amer 04/02/2020 >60  >60 mL/min Final  . GFR calc Af Amer 04/02/2020 >60  >60 mL/min Final  . Anion gap 04/02/2020 10  5 - 15 Final   Performed at [facility], [place] [address], [place], [place] [address]  . Prothrombin Time 04/02/2020 13.2  11.4 - 15.2 seconds Final  . INR 04/02/2020 1.0  0.8 - 1.2 Final   Comment: (NOTE) INR goal varies based on device and disease states. Performed at [facility], [place] [address] [place], [place] [address]   . ABO/RH(D) 04/02/2020 O POS   Final  . Antibody Screen 04/02/2020 NEG   Final  . Sample Expiration 04/02/2020 04/14/2020,2359   Final  . Extend sample reason 04/02/2020    Final                   Value:NO TRANSFUSIONS OR PREGNANCY IN THE PAST 3 MONTHS Performed at [place] [address], [place], [place] [address]      X-Rays:No results found.  EKG: Orders placed or performed during the hospital encounter of 04/02/20  . EKG 12 lead  . EKG 12 lead     Hospital Course: ALECEA TREGO is a 59 y.o. who was admitted to [facility] They were brought to the operating room on 04/11/2020 and underwent Procedure(s): Right knee polyethylene exchange.  Patient tolerated the procedure well and was later transferred to the recovery room and then to the orthopaedic floor for postoperative care. They were given PO and IV analgesics for pain  control following their surgery. They were given 24 hours of postoperative antibiotics of  Anti-infectives (From admission, onward)   Start     Dose/Rate Route Frequency Ordered Stop   04/11/20 1500  ceFAZolin (ANCEF) IVPB 2g/100 mL premix        2 g 200 mL/hr over 30 Minutes Intravenous Every 6 hours 04/11/20 1122 04/11/20 2314   04/11/20 0645  ceFAZolin (ANCEF) IVPB 2g/100 mL premix        2 g 200 mL/hr over 30 Minutes Intravenous On call to O.R. 04/11/20 5366 04/11/20 4403     and started on DVT prophylaxis in the form of Aspirin.   PT and OT were ordered for total joint protocol. Discharge planning consulted to help with postop disposition and equipment needs.  Patient had a good night on the evening of surgery. They started to get up OOB with therapy on POD #0. Pt was seen during rounds and was ready to go home pending progress with therapy. She worked with therapy on POD #1 and was meeting her goals. Pt was discharged to home later that day in stable condition.  Diet: Regular diet Activity: WBAT Follow-up: in 2 weeks Disposition: Home with outpatient therapy at [facility] Discharged Condition: stable   Discharge Instructions    Call MD / Call 911   Complete by: As directed    If you experience chest pain or shortness of breath, CALL 911 and be transported to the hospital emergency room.  If you develope a fever above 101 F, pus (white drainage) or increased drainage or redness at the wound, or calf pain, call your surgeon's office.   Change dressing   Complete by: As directed    You may remove the bulky bandage (ACE wrap and gauze) two days after surgery. You will have an adhesive waterproof bandage underneath. Leave this in place until your  first follow-up appointment.   Constipation Prevention   Complete by: As directed    Drink plenty of fluids.  Prune juice may be helpful.  You may use a stool softener, such as Colace (over the counter) 100 mg twice a day.  Use MiraLax (over  the counter) for constipation as needed.   Diet - low sodium heart healthy   Complete by: As directed    Do not put a pillow under the knee. Place it under the heel.   Complete by: As directed    Driving restrictions   Complete by: As directed    No driving for two weeks   TED hose   Complete by: As directed    Use stockings (TED hose) for three weeks on both leg(s).  You may remove them at night for sleeping.   Weight bearing as tolerated   Complete by: As directed      Allergies as of 04/12/2020      Reactions   Darvocet [propoxyphene N-acetaminophen] Swelling   Latex Hives   "contact only'   Lodine [etodolac] Swelling   Methocarbamol Swelling      Medication List    STOP taking these medications   naproxen sodium 220 MG tablet Commonly known as: ALEVE     TAKE these medications   aspirin 325 MG EC tablet Take 1 tablet (325 mg total) by mouth 2 (two) times daily for 20 days. Then take one 81 mg aspirin once a day for three weeks. Then discontinue aspirin.   cholestyramine 4 GM/DOSE powder Commonly known as: QUESTRAN Take 4 g by mouth daily as needed (stomach pain).   cyclobenzaprine 10 MG tablet Commonly known as: FLEXERIL Take 1 tablet (10 mg total) by mouth 3 (three) times daily as needed for muscle spasms.   DAILY HERBS IMMUNE DEFENSE PO Take 1 tablet by mouth daily.   dicyclomine 20 MG tablet Commonly known as: BENTYL Take 1 tablet (20 mg total) by mouth every 6 (six) hours as needed for spasms (diarrhea, abdominal cramps).   estradiol 1 MG tablet Commonly known as: ESTRACE Take 1 mg by mouth daily.   fluticasone 50 MCG/ACT nasal spray Commonly known as: FLONASE Place 1 spray into both nostrils daily as needed for allergies or rhinitis.   hydrochlorothiazide 25 MG tablet Commonly known as: HYDRODIURIL Take 25 mg by mouth daily.   HYDROcodone-acetaminophen 5-325 MG tablet Commonly known as: NORCO/VICODIN Take 1-2 tablets by mouth every 6 (six) hours  as needed for severe pain.   loratadine 10 MG tablet Commonly known as: CLARITIN Take 10 mg by mouth daily as needed for allergies.   multivitamin with minerals tablet Take 1 tablet by mouth daily.   traMADol 50 MG tablet Commonly known as: ULTRAM Take 50 mg by mouth every 6 (six) hours as needed for moderate pain.            Discharge Care Instructions  (From admission, onward)         Start     Ordered   04/12/20 0000  Weight bearing as tolerated        04/12/20 0744   04/12/20 0000  Change dressing       Comments: You may remove the bulky bandage (ACE wrap and gauze) two days after surgery. You will have an adhesive waterproof bandage underneath. Leave this in place until your first follow-up appointment.   04/12/20 0744          Follow-up Information  Gaynelle Arabian, MD. Schedule an appointment as soon as possible for a visit in 2 week(s).   Specialty: Orthopedic Surgery Contact information: [address] [place] [address] [phone]               Signed: Theresa Duty, PA-C Orthopedic Surgery 04/18/2020, 4:49 PM

## 2020-04-19 DIAGNOSIS — M25661 Stiffness of right knee, not elsewhere classified: Secondary | ICD-10-CM | POA: Diagnosis not present

## 2020-04-20 DIAGNOSIS — Z96651 Presence of right artificial knee joint: Secondary | ICD-10-CM | POA: Diagnosis not present

## 2020-04-20 DIAGNOSIS — Z471 Aftercare following joint replacement surgery: Secondary | ICD-10-CM | POA: Insufficient documentation

## 2020-04-24 DIAGNOSIS — T148XXA Other injury of unspecified body region, initial encounter: Secondary | ICD-10-CM | POA: Insufficient documentation

## 2020-04-24 DIAGNOSIS — Z96651 Presence of right artificial knee joint: Secondary | ICD-10-CM | POA: Diagnosis not present

## 2020-04-24 DIAGNOSIS — L7632 Postprocedural hematoma of skin and subcutaneous tissue following other procedure: Secondary | ICD-10-CM | POA: Diagnosis not present

## 2020-04-24 DIAGNOSIS — Z471 Aftercare following joint replacement surgery: Secondary | ICD-10-CM | POA: Diagnosis not present

## 2020-04-25 DIAGNOSIS — M25661 Stiffness of right knee, not elsewhere classified: Secondary | ICD-10-CM | POA: Diagnosis not present

## 2020-04-30 DIAGNOSIS — M25661 Stiffness of right knee, not elsewhere classified: Secondary | ICD-10-CM | POA: Diagnosis not present

## 2020-05-02 DIAGNOSIS — M25661 Stiffness of right knee, not elsewhere classified: Secondary | ICD-10-CM | POA: Diagnosis not present

## 2020-05-04 DIAGNOSIS — M25661 Stiffness of right knee, not elsewhere classified: Secondary | ICD-10-CM | POA: Diagnosis not present

## 2020-05-07 DIAGNOSIS — M25661 Stiffness of right knee, not elsewhere classified: Secondary | ICD-10-CM | POA: Diagnosis not present

## 2020-05-09 DIAGNOSIS — M25661 Stiffness of right knee, not elsewhere classified: Secondary | ICD-10-CM | POA: Diagnosis not present

## 2020-05-11 DIAGNOSIS — M25661 Stiffness of right knee, not elsewhere classified: Secondary | ICD-10-CM | POA: Diagnosis not present

## 2020-05-15 DIAGNOSIS — M25661 Stiffness of right knee, not elsewhere classified: Secondary | ICD-10-CM | POA: Diagnosis not present

## 2020-05-17 DIAGNOSIS — M25661 Stiffness of right knee, not elsewhere classified: Secondary | ICD-10-CM | POA: Diagnosis not present

## 2020-05-22 DIAGNOSIS — Z96651 Presence of right artificial knee joint: Secondary | ICD-10-CM | POA: Diagnosis not present

## 2020-05-22 DIAGNOSIS — Z471 Aftercare following joint replacement surgery: Secondary | ICD-10-CM | POA: Diagnosis not present

## 2020-05-22 DIAGNOSIS — M25661 Stiffness of right knee, not elsewhere classified: Secondary | ICD-10-CM | POA: Diagnosis not present

## 2020-05-24 DIAGNOSIS — M25661 Stiffness of right knee, not elsewhere classified: Secondary | ICD-10-CM | POA: Diagnosis not present

## 2020-07-02 DIAGNOSIS — Z471 Aftercare following joint replacement surgery: Secondary | ICD-10-CM | POA: Diagnosis not present

## 2020-07-02 DIAGNOSIS — G5602 Carpal tunnel syndrome, left upper limb: Secondary | ICD-10-CM | POA: Diagnosis not present

## 2020-07-23 ENCOUNTER — Other Ambulatory Visit: Payer: Self-pay | Admitting: Internal Medicine

## 2020-07-23 DIAGNOSIS — Z1231 Encounter for screening mammogram for malignant neoplasm of breast: Secondary | ICD-10-CM

## 2020-07-27 DIAGNOSIS — J0120 Acute ethmoidal sinusitis, unspecified: Secondary | ICD-10-CM | POA: Diagnosis not present

## 2020-07-27 DIAGNOSIS — R0981 Nasal congestion: Secondary | ICD-10-CM | POA: Diagnosis not present

## 2020-08-15 DIAGNOSIS — H524 Presbyopia: Secondary | ICD-10-CM | POA: Diagnosis not present

## 2020-08-20 DIAGNOSIS — H524 Presbyopia: Secondary | ICD-10-CM | POA: Diagnosis not present

## 2020-08-20 DIAGNOSIS — H52223 Regular astigmatism, bilateral: Secondary | ICD-10-CM | POA: Diagnosis not present

## 2020-08-22 ENCOUNTER — Ambulatory Visit: Payer: Medicare HMO

## 2020-09-13 ENCOUNTER — Ambulatory Visit
Admission: RE | Admit: 2020-09-13 | Discharge: 2020-09-13 | Disposition: A | Discharge status: Home / Self Care | Payer: Medicare HMO | Source: Ambulatory Visit | Attending: Internal Medicine | Admitting: Internal Medicine

## 2020-09-13 ENCOUNTER — Other Ambulatory Visit: Payer: Self-pay

## 2020-09-13 DIAGNOSIS — Z1231 Encounter for screening mammogram for malignant neoplasm of breast: Secondary | ICD-10-CM

## 2020-09-28 DIAGNOSIS — Z96651 Presence of right artificial knee joint: Secondary | ICD-10-CM | POA: Diagnosis not present

## 2020-10-10 ENCOUNTER — Other Ambulatory Visit: Payer: Self-pay | Admitting: Internal Medicine

## 2020-10-19 ENCOUNTER — Telehealth: Payer: Self-pay | Admitting: Internal Medicine

## 2020-10-19 MED ORDER — DICYCLOMINE HCL 20 MG PO TABS: 20.0000 mg | ORAL_TABLET | Freq: Four times a day (QID) | ORAL | 0 refills | Status: AC | PRN

## 2020-10-19 NOTE — Telephone Encounter (Signed)
Patient informed that bentyl has been sent in.

## 2020-10-22 DIAGNOSIS — Z20822 Contact with and (suspected) exposure to covid-19: Secondary | ICD-10-CM | POA: Diagnosis not present

## 2020-11-12 DIAGNOSIS — M5106 Intervertebral disc disorders with myelopathy, lumbar region: Secondary | ICD-10-CM | POA: Diagnosis not present

## 2020-11-12 DIAGNOSIS — M47892 Other spondylosis, cervical region: Secondary | ICD-10-CM | POA: Diagnosis not present

## 2020-11-12 DIAGNOSIS — M47896 Other spondylosis, lumbar region: Secondary | ICD-10-CM | POA: Diagnosis not present

## 2020-11-12 DIAGNOSIS — M7918 Myalgia, other site: Secondary | ICD-10-CM | POA: Diagnosis not present

## 2020-11-12 DIAGNOSIS — Z79899 Other long term (current) drug therapy: Secondary | ICD-10-CM | POA: Diagnosis not present

## 2020-11-12 DIAGNOSIS — M47816 Spondylosis without myelopathy or radiculopathy, lumbar region: Secondary | ICD-10-CM | POA: Diagnosis not present

## 2020-11-12 DIAGNOSIS — Z6834 Body mass index (BMI) 34.0-34.9, adult: Secondary | ICD-10-CM | POA: Diagnosis not present

## 2020-11-12 DIAGNOSIS — Z79891 Long term (current) use of opiate analgesic: Secondary | ICD-10-CM | POA: Diagnosis not present

## 2020-11-12 DIAGNOSIS — G894 Chronic pain syndrome: Secondary | ICD-10-CM | POA: Diagnosis not present

## 2020-11-26 ENCOUNTER — Ambulatory Visit (INDEPENDENT_AMBULATORY_CARE_PROVIDER_SITE_OTHER): Payer: Medicare HMO | Admitting: Internal Medicine

## 2020-11-26 ENCOUNTER — Encounter: Payer: Self-pay | Admitting: Internal Medicine

## 2020-11-26 VITALS — BP 126/82 | HR 75 | Ht 65.0 in | Wt 196.8 lb

## 2020-11-26 DIAGNOSIS — E6609 Other obesity due to excess calories: Secondary | ICD-10-CM | POA: Diagnosis not present

## 2020-11-26 DIAGNOSIS — E739 Lactose intolerance, unspecified: Secondary | ICD-10-CM

## 2020-11-26 DIAGNOSIS — K580 Irritable bowel syndrome with diarrhea: Secondary | ICD-10-CM

## 2020-11-26 DIAGNOSIS — Z6832 Body mass index (BMI) 32.0-32.9, adult: Secondary | ICD-10-CM | POA: Diagnosis not present

## 2020-11-26 NOTE — Patient Instructions (Signed)
Good to see you. Great work changing eating habits.  As we discussed here is some more information about that.  Read The Obesity Code by Dr. Sharman Cheek or Fast, Feat, Repeat by Kizzie Furnish and implement  suggestions. Investigate and sign up for the www.dietdoctor.com website if desired and utilize those resources. Checkout Dr. Luis Abed on YouTube - lots of great free videos You can also look up Dr. Enrigue Catena on the Internet and YouTube I have provided handouts on insulin resistance, restricted feeding/intermittent fasting, and proper food choices to lower and eliminate insulin resistance and lose weight. Have a long-term approach to this and do not expect rapid results but have a 1 to 2-year timeframe to change her eating and to become fat adapted.  We will send prescriptions when pharmacy calls - and see you next year for colonoscopy.  I appreciate the opportunity to care for you. Gatha Mayer, MD, [facility]

## 2020-11-26 NOTE — Progress Notes (Signed)
Jennifer [name] 60 y.o. 07-25-1961 [id number]  Assessment & Plan:   Encounter Diagnoses  Name Primary?  . Lactose intolerance Yes  . Irritable bowel syndrome with diarrhea   . Class 1 obesity due to excess calories without serious comorbidity with body mass index (BMI) of 32.0 to 32.9 in adult    She will continue with current therapy avoiding lactose, trying to avoid food triggers otherwise as described in the HPI.  Dicyclomine as needed and daily cholestyramine will continue also.  She will be due for a routine screening colonoscopy next year and is in our recall system.  We also had a conversation about her weight and eating habits and I have made the recommendations below.  She has been trying to lose weight and has lost some weight in the recent past.  She is congratulated on this.   Read The Obesity Code by Dr. Sharman Cheek or Fast, Feat, Repeat by Kizzie Furnish and implement  suggestions. Investigate and sign up for the www.dietdoctor.com website if desired and utilize those resources. Checkout Dr. Luis Abed on YouTube You can also look up Dr. Enrigue Catena on the Internet and YouTube I have provided handouts on insulin resistance, restricted feeding/intermittent fasting, and proper food choices to lower and eliminate insulin resistance and lose weight. Have a long-term approach to this and do not expect rapid results but have a 1 to 2-year timeframe to change her eating and to become fat adapted.  I appreciate the opportunity to care for this patient. CC: Ernestene Kiel, MD   Subjective:   Chief Complaint: IBS and lactose intolerance follow-up  HPI  Jennifer [name] presents for follow-up of her IBS and lactose intolerance issues.  Other medical problems include hypertension and arthritis.  She is really doing well using dicyclomine intermittently and trying to avoid lactose and taking daily cholestyramine.  Bowel movements about once a day.  If she has dietary indiscretion and eat  things like pizza or chili dogs or spaghetti or greasy foods in general she may have an attack of abdominal cramps and loose stools.  This is adequately treated by dicyclomine.  She has been changing and trying to reduce caloric intake and changing how she eats some as well and has lost some weight as noted below.  Last visit with me was February 2020.  Wt Readings from Last 3 Encounters:  11/26/20 196 lb 12.8 oz (89.3 kg)  04/11/20 200 lb 2.3 oz (90.8 kg)  04/02/20 200 lb 1 oz (90.7 kg)    Allergies  Allergen Reactions  . Darvocet [Propoxyphene N-Acetaminophen] Swelling  . Latex Hives    "contact only'  . Lodine [Etodolac] Swelling  . Methocarbamol Swelling   Current Meds  Medication Sig  . cholestyramine (QUESTRAN) 4 GM/DOSE powder Take 4 g by mouth daily as needed (stomach pain).  Marland Kitchen dicyclomine (BENTYL) 20 MG tablet Take 1 tablet (20 mg total) by mouth every 6 (six) hours as needed for spasms (diarrhea, abdominal cramps).  Marland Kitchen estradiol (ESTRACE) 1 MG tablet Take 1 mg by mouth daily.   . fluticasone (FLONASE) 50 MCG/ACT nasal spray Place 1 spray into both nostrils daily as needed for allergies or rhinitis.  . hydrochlorothiazide (HYDRODIURIL) 25 MG tablet Take 25 mg by mouth daily.  Marland Kitchen loratadine (CLARITIN) 10 MG tablet Take 10 mg by mouth daily as needed for allergies.  . Multiple Vitamins-Minerals (MULTIVITAMIN WITH MINERALS) tablet Take 1 tablet by mouth daily.  . traMADol (ULTRAM) 50 MG tablet Take 50 mg  by mouth every 6 (six) hours as needed for moderate pain.   . Zinc Sulfate (ZINC 15 PO) Take 1 tablet by mouth daily.   Past Medical History:  Diagnosis Date  . Arthritis    arthritis back and knees  . Blood transfusion 05/2011  . Hypertension   . IBS (irritable bowel syndrome)   . Transfusion history    s/p 2012 RTKA [facility])   Past Surgical History:  Procedure Laterality Date  . ABDOMINAL HYSTERECTOMY  1996   partial  . BACK SURGERY  2003/2007   discectomy lower back  '03/ '07 rods and artificial disk- Fusion done last time.  . CERVICAL FUSION  2006  . CHOLECYSTECTOMY  2006  . COLONOSCOPY    . TOTAL KNEE ARTHROPLASTY  04/2011   Right  . TOTAL KNEE ARTHROPLASTY Left 08/18/2016   Procedure: LEFT TOTAL KNEE ARTHROPLASTY;  Surgeon: Gaynelle Arabian, MD;  Location: [facility];  Service: Orthopedics;  Laterality: Left;  . TOTAL KNEE REVISION Right 04/11/2020   Procedure: Right knee polyethylene exchange;  Surgeon: Gaynelle Arabian, MD;  Location: [facility];  Service: Orthopedics;  Laterality: Right;  8min   Social History   Social History Narrative   She is widowed and she has 3 daughters   Disabled   Never smoker no other tobacco   No alcohol, no illicit drug use   1 caffeinated beverage daily   family history includes Crohn's disease in her brother; Diabetes in her brother and brother; Prostate cancer in her father.   Review of Systems As above Partial knee in June 2021  Objective:   Physical Exam BP 126/82   Pulse 75   Ht 5\' 5"  (1.651 m)   Wt 196 lb 12.8 oz (89.3 kg)   SpO2 99%   BMI 32.75 kg/m  NAD black woman Lungs cta Cor NL abd soft NT Carries weight in hips, thighs

## 2020-11-29 DIAGNOSIS — M1990 Unspecified osteoarthritis, unspecified site: Secondary | ICD-10-CM | POA: Diagnosis not present

## 2020-11-29 DIAGNOSIS — R197 Diarrhea, unspecified: Secondary | ICD-10-CM | POA: Diagnosis not present

## 2020-11-29 DIAGNOSIS — I10 Essential (primary) hypertension: Secondary | ICD-10-CM | POA: Diagnosis not present

## 2020-11-29 DIAGNOSIS — E669 Obesity, unspecified: Secondary | ICD-10-CM | POA: Diagnosis not present

## 2020-11-29 DIAGNOSIS — G8324 Monoplegia of upper limb affecting left nondominant side: Secondary | ICD-10-CM | POA: Diagnosis not present

## 2020-11-29 DIAGNOSIS — G8929 Other chronic pain: Secondary | ICD-10-CM | POA: Diagnosis not present

## 2020-11-29 DIAGNOSIS — Z79890 Hormone replacement therapy (postmenopausal): Secondary | ICD-10-CM | POA: Diagnosis not present

## 2020-11-29 DIAGNOSIS — Z79891 Long term (current) use of opiate analgesic: Secondary | ICD-10-CM | POA: Diagnosis not present

## 2020-11-29 DIAGNOSIS — E785 Hyperlipidemia, unspecified: Secondary | ICD-10-CM | POA: Diagnosis not present

## 2020-11-29 DIAGNOSIS — M545 Low back pain, unspecified: Secondary | ICD-10-CM | POA: Diagnosis not present

## 2020-12-10 ENCOUNTER — Other Ambulatory Visit: Payer: Self-pay | Admitting: Internal Medicine

## 2020-12-10 DIAGNOSIS — Z7189 Other specified counseling: Secondary | ICD-10-CM | POA: Diagnosis not present

## 2020-12-10 DIAGNOSIS — J0100 Acute maxillary sinusitis, unspecified: Secondary | ICD-10-CM | POA: Diagnosis not present

## 2020-12-10 DIAGNOSIS — Z79899 Other long term (current) drug therapy: Secondary | ICD-10-CM | POA: Diagnosis not present

## 2020-12-10 DIAGNOSIS — I10 Essential (primary) hypertension: Secondary | ICD-10-CM | POA: Diagnosis not present

## 2020-12-10 DIAGNOSIS — E041 Nontoxic single thyroid nodule: Secondary | ICD-10-CM | POA: Diagnosis not present

## 2020-12-10 DIAGNOSIS — Z1339 Encounter for screening examination for other mental health and behavioral disorders: Secondary | ICD-10-CM | POA: Diagnosis not present

## 2020-12-10 DIAGNOSIS — Z0000 Encounter for general adult medical examination without abnormal findings: Secondary | ICD-10-CM | POA: Diagnosis not present

## 2020-12-10 DIAGNOSIS — Z6833 Body mass index (BMI) 33.0-33.9, adult: Secondary | ICD-10-CM | POA: Diagnosis not present

## 2020-12-10 DIAGNOSIS — Z1331 Encounter for screening for depression: Secondary | ICD-10-CM | POA: Diagnosis not present

## 2020-12-10 DIAGNOSIS — N760 Acute vaginitis: Secondary | ICD-10-CM | POA: Diagnosis not present

## 2020-12-24 ENCOUNTER — Ambulatory Visit
Admission: RE | Admit: 2020-12-24 | Discharge: 2020-12-24 | Disposition: A | Discharge status: Home / Self Care | Payer: Medicare HMO | Source: Ambulatory Visit | Attending: Internal Medicine | Admitting: Internal Medicine

## 2020-12-24 DIAGNOSIS — E041 Nontoxic single thyroid nodule: Secondary | ICD-10-CM | POA: Diagnosis not present

## 2021-02-04 DIAGNOSIS — G5602 Carpal tunnel syndrome, left upper limb: Secondary | ICD-10-CM | POA: Diagnosis not present

## 2021-02-04 DIAGNOSIS — M25531 Pain in right wrist: Secondary | ICD-10-CM | POA: Diagnosis not present

## 2021-02-04 DIAGNOSIS — M13842 Other specified arthritis, left hand: Secondary | ICD-10-CM | POA: Diagnosis not present

## 2021-03-15 DIAGNOSIS — M25561 Pain in right knee: Secondary | ICD-10-CM | POA: Diagnosis not present

## 2021-03-27 DIAGNOSIS — M25562 Pain in left knee: Secondary | ICD-10-CM | POA: Diagnosis not present

## 2021-03-27 DIAGNOSIS — M25561 Pain in right knee: Secondary | ICD-10-CM | POA: Diagnosis not present

## 2021-04-09 DIAGNOSIS — Z79899 Other long term (current) drug therapy: Secondary | ICD-10-CM | POA: Diagnosis not present

## 2021-04-09 DIAGNOSIS — R2681 Unsteadiness on feet: Secondary | ICD-10-CM | POA: Diagnosis not present

## 2021-04-09 DIAGNOSIS — R296 Repeated falls: Secondary | ICD-10-CM | POA: Diagnosis not present

## 2021-04-09 DIAGNOSIS — I10 Essential (primary) hypertension: Secondary | ICD-10-CM | POA: Diagnosis not present

## 2021-04-10 DIAGNOSIS — M25562 Pain in left knee: Secondary | ICD-10-CM | POA: Diagnosis not present

## 2021-04-10 DIAGNOSIS — M25561 Pain in right knee: Secondary | ICD-10-CM | POA: Diagnosis not present

## 2021-04-12 ENCOUNTER — Other Ambulatory Visit: Payer: Self-pay | Admitting: Internal Medicine

## 2021-04-12 DIAGNOSIS — R296 Repeated falls: Secondary | ICD-10-CM

## 2021-04-12 DIAGNOSIS — R2681 Unsteadiness on feet: Secondary | ICD-10-CM

## 2021-04-12 DIAGNOSIS — I10 Essential (primary) hypertension: Secondary | ICD-10-CM

## 2021-04-27 ENCOUNTER — Other Ambulatory Visit: Payer: Self-pay

## 2021-04-27 ENCOUNTER — Ambulatory Visit
Admission: RE | Admit: 2021-04-27 | Discharge: 2021-04-27 | Disposition: A | Discharge status: Home / Self Care | Payer: Medicare HMO | Source: Ambulatory Visit | Attending: Internal Medicine | Admitting: Internal Medicine

## 2021-04-27 DIAGNOSIS — R2681 Unsteadiness on feet: Secondary | ICD-10-CM | POA: Diagnosis not present

## 2021-04-27 DIAGNOSIS — I10 Essential (primary) hypertension: Secondary | ICD-10-CM

## 2021-04-27 DIAGNOSIS — R296 Repeated falls: Secondary | ICD-10-CM

## 2021-04-27 MED ORDER — GADOBENATE DIMEGLUMINE 529 MG/ML IV SOLN
10.0000 mL | Freq: Once | INTRAVENOUS | Status: AC | PRN
  Administered 2021-04-27: 10 mL via INTRAVENOUS

## 2021-05-08 DIAGNOSIS — M79645 Pain in left finger(s): Secondary | ICD-10-CM | POA: Insufficient documentation

## 2021-05-08 DIAGNOSIS — M13842 Other specified arthritis, left hand: Secondary | ICD-10-CM | POA: Diagnosis not present

## 2021-05-08 DIAGNOSIS — M25531 Pain in right wrist: Secondary | ICD-10-CM | POA: Diagnosis not present

## 2021-05-08 DIAGNOSIS — G5602 Carpal tunnel syndrome, left upper limb: Secondary | ICD-10-CM | POA: Diagnosis not present

## 2021-06-10 DIAGNOSIS — Z20822 Contact with and (suspected) exposure to covid-19: Secondary | ICD-10-CM | POA: Diagnosis not present

## 2021-07-23 DIAGNOSIS — H524 Presbyopia: Secondary | ICD-10-CM | POA: Diagnosis not present

## 2021-08-05 DIAGNOSIS — G5602 Carpal tunnel syndrome, left upper limb: Secondary | ICD-10-CM | POA: Diagnosis not present

## 2021-08-05 DIAGNOSIS — M13842 Other specified arthritis, left hand: Secondary | ICD-10-CM | POA: Diagnosis not present

## 2021-08-12 ENCOUNTER — Other Ambulatory Visit: Payer: Self-pay | Admitting: Internal Medicine

## 2021-08-12 DIAGNOSIS — Z1231 Encounter for screening mammogram for malignant neoplasm of breast: Secondary | ICD-10-CM

## 2021-09-16 ENCOUNTER — Ambulatory Visit
Admission: RE | Admit: 2021-09-16 | Discharge: 2021-09-16 | Disposition: A | Discharge status: Home / Self Care | Payer: Medicare HMO | Source: Ambulatory Visit | Attending: Internal Medicine | Admitting: Internal Medicine

## 2021-09-16 DIAGNOSIS — Z1231 Encounter for screening mammogram for malignant neoplasm of breast: Secondary | ICD-10-CM

## 2021-10-23 DIAGNOSIS — Z6834 Body mass index (BMI) 34.0-34.9, adult: Secondary | ICD-10-CM | POA: Diagnosis not present

## 2021-10-23 DIAGNOSIS — Z79899 Other long term (current) drug therapy: Secondary | ICD-10-CM | POA: Diagnosis not present

## 2021-10-23 DIAGNOSIS — G894 Chronic pain syndrome: Secondary | ICD-10-CM | POA: Diagnosis not present

## 2021-10-23 DIAGNOSIS — M7062 Trochanteric bursitis, left hip: Secondary | ICD-10-CM | POA: Diagnosis not present

## 2021-10-23 DIAGNOSIS — Z79891 Long term (current) use of opiate analgesic: Secondary | ICD-10-CM | POA: Diagnosis not present

## 2021-10-23 DIAGNOSIS — M4326 Fusion of spine, lumbar region: Secondary | ICD-10-CM | POA: Diagnosis not present

## 2021-10-23 DIAGNOSIS — M47816 Spondylosis without myelopathy or radiculopathy, lumbar region: Secondary | ICD-10-CM | POA: Diagnosis not present

## 2021-11-06 DIAGNOSIS — Z809 Family history of malignant neoplasm, unspecified: Secondary | ICD-10-CM | POA: Diagnosis not present

## 2021-11-06 DIAGNOSIS — I10 Essential (primary) hypertension: Secondary | ICD-10-CM | POA: Diagnosis not present

## 2021-11-06 DIAGNOSIS — Z791 Long term (current) use of non-steroidal anti-inflammatories (NSAID): Secondary | ICD-10-CM | POA: Diagnosis not present

## 2021-11-06 DIAGNOSIS — Z780 Asymptomatic menopausal state: Secondary | ICD-10-CM | POA: Diagnosis not present

## 2021-11-06 DIAGNOSIS — J302 Other seasonal allergic rhinitis: Secondary | ICD-10-CM | POA: Diagnosis not present

## 2021-11-06 DIAGNOSIS — K589 Irritable bowel syndrome without diarrhea: Secondary | ICD-10-CM | POA: Diagnosis not present

## 2021-11-06 DIAGNOSIS — Z79890 Hormone replacement therapy (postmenopausal): Secondary | ICD-10-CM | POA: Diagnosis not present

## 2021-11-06 DIAGNOSIS — G8929 Other chronic pain: Secondary | ICD-10-CM | POA: Diagnosis not present

## 2021-11-06 DIAGNOSIS — E669 Obesity, unspecified: Secondary | ICD-10-CM | POA: Diagnosis not present

## 2021-11-06 DIAGNOSIS — Z6832 Body mass index (BMI) 32.0-32.9, adult: Secondary | ICD-10-CM | POA: Diagnosis not present

## 2021-11-06 DIAGNOSIS — Z7722 Contact with and (suspected) exposure to environmental tobacco smoke (acute) (chronic): Secondary | ICD-10-CM | POA: Diagnosis not present

## 2021-11-06 DIAGNOSIS — M1990 Unspecified osteoarthritis, unspecified site: Secondary | ICD-10-CM | POA: Diagnosis not present

## 2021-11-06 DIAGNOSIS — Z008 Encounter for other general examination: Secondary | ICD-10-CM | POA: Diagnosis not present

## 2021-11-21 DIAGNOSIS — M7062 Trochanteric bursitis, left hip: Secondary | ICD-10-CM | POA: Diagnosis not present

## 2021-11-21 DIAGNOSIS — Z6834 Body mass index (BMI) 34.0-34.9, adult: Secondary | ICD-10-CM | POA: Diagnosis not present

## 2021-11-21 DIAGNOSIS — M4326 Fusion of spine, lumbar region: Secondary | ICD-10-CM | POA: Diagnosis not present

## 2021-11-27 ENCOUNTER — Encounter: Payer: Self-pay | Admitting: Physical Therapy

## 2021-11-27 ENCOUNTER — Ambulatory Visit: Payer: Medicare HMO | Attending: Orthopaedic Surgery | Admitting: Physical Therapy

## 2021-11-27 ENCOUNTER — Other Ambulatory Visit: Payer: Self-pay

## 2021-11-27 DIAGNOSIS — M6281 Muscle weakness (generalized): Secondary | ICD-10-CM | POA: Diagnosis not present

## 2021-11-27 DIAGNOSIS — M25552 Pain in left hip: Secondary | ICD-10-CM | POA: Diagnosis not present

## 2021-11-27 DIAGNOSIS — R2681 Unsteadiness on feet: Secondary | ICD-10-CM | POA: Insufficient documentation

## 2021-11-27 DIAGNOSIS — R262 Difficulty in walking, not elsewhere classified: Secondary | ICD-10-CM | POA: Diagnosis not present

## 2021-11-27 NOTE — Patient Instructions (Signed)
Access Code: [place] URL: [email or web address] Date: 11/27/2021 Prepared by: Ethel Rana  Exercises Supine Hamstring Stretch - 1 x daily - 7 x weekly - 3 sets - 15 hold Supine Figure 4 Piriformis Stretch - 1 x daily - 7 x weekly - 3 sets - 15 hold Supine Piriformis Stretch with Leg Straight - 1 x daily - 7 x weekly - 3 sets - 15 hold

## 2021-11-27 NOTE — Therapy (Signed)
[name]. [place], [place], [address] Phone: [phone]   Fax:  [phone]  Physical Therapy Evaluation  Patient Details  Name: Jennifer [name] MRN: [id number] Date of Birth: 13-Dec-1960 Referring Provider (PT): Max Patrice Paradise   Encounter Date: 11/27/2021   PT End of Session - 11/27/21 1142     Visit Number 1    Number of Visits 20    Date for PT Re-Evaluation 02/05/22    PT Start Time 1103    PT Stop Time 1141    PT Time Calculation (min) 38 min    Activity Tolerance Patient tolerated treatment well    Behavior During Therapy [facility] for tasks assessed/performed             Past Medical History:  Diagnosis Date   Arthritis    arthritis back and knees   Blood transfusion 05/2011   Hypertension    IBS (irritable bowel syndrome)    Transfusion history    s/p 2012 RTKA ([facility])    Past Surgical History:  Procedure Laterality Date   ABDOMINAL HYSTERECTOMY  1996   partial   BACK SURGERY  2003/2007   discectomy lower back '03/ '07 rods and artificial disk- Fusion done last time.   CERVICAL FUSION  2006   CHOLECYSTECTOMY  2006   COLONOSCOPY     TOTAL KNEE ARTHROPLASTY  04/2011   Right   TOTAL KNEE ARTHROPLASTY Left 08/18/2016   Procedure: LEFT TOTAL KNEE ARTHROPLASTY;  Surgeon: Gaynelle Arabian, MD;  Location: [facility];  Service: Orthopedics;  Laterality: Left;   TOTAL KNEE REVISION Right 04/11/2020   Procedure: Right knee polyethylene exchange;  Surgeon: Gaynelle Arabian, MD;  Location: [facility];  Service: Orthopedics;  Laterality: Right;  65min    There were no vitals filed for this visit.    Subjective Assessment - 11/27/21 1108     Subjective Patient reports L hip Bursitis. She has had 2 injections. she had one in January, the last one was Feb 9. It affects her mostly during sleep, when sleeping on her L side. the first shot helped, but she still had pain, so they provided the second. The pain started after  falling 3 times last May.    How long can you sit comfortably? Stiffens up if she sits for 30 minutes or more.    How long can you stand comfortably? Severely limits standing, no more than 10-15 minutes.    How long can you walk comfortably? She can sometimes feel one or other knee give out while ambulating.    Patient Stated Goals Be able to return to her normal activity level without pain. She now has to take many more breaks.    Currently in Pain? No/denies                [facility] PT Assessment - 11/27/21 0001       Assessment   Medical Diagnosis L hip Bursitis    Referring Provider (PT) Max Cohen      Restrictions   Weight Bearing Restrictions No      Balance Screen   Has the patient fallen in the past 6 months No      [place] residence    Home Access Level entry    [place] One level      Prior Function   Level of Independence Independent    Vocation Part time employment    Leisure shopping  Cognition   Overall Cognitive Status Within Functional Limits for tasks assessed      Posture/Postural Control   Posture Comments WFL      ROM / Strength   AROM / PROM / Strength AROM;Strength      AROM   Overall AROM Comments BLE ROM WFL, R hip IR slightly less than L.      Strength   Overall Strength Comments B hip, knee, ankle strength at least 4/5, except hip extension4-/5.      Palpation   Palpation comment Patient reports TTP along both ITB, L gluts, piriformis.      Ambulation/Gait   Gait Comments slightly antalgic gait on R.                        Objective measurements completed on examination: See above findings.                PT Education - 11/27/21 1134     Education Details HEP, POC    Person(s) Educated Patient    Methods Explanation;Demonstration;Handout    Comprehension Returned demonstration;Verbalized understanding              PT Short Term Goals - 11/27/21 1446        PT SHORT TERM GOAL #1   Title I with basic HEP.    Time 4    Period Weeks    Status New    Target Date 12/25/21               PT Long Term Goals - 11/27/21 1447       PT LONG TERM GOAL #1   Title Patient to be independent with advanced HEP    Time 8    Period Weeks    Status New    Target Date 01/22/22      PT LONG TERM GOAL #2   Title Patient will walk at least 600' without increased pain in L hip    Baseline Unable to walk without pain.    Time 8    Period Weeks    Status New    Target Date 01/22/22      PT LONG TERM GOAL #3   Title patient will score < 20 sec in 5 x STS test to show improved BLE functional strength..    Baseline TBD    Time 8    Period Weeks    Status New    Target Date 01/22/22      PT LONG TERM GOAL #4   Title Pateint will score at lest 24 on FGA test to demosntrate low fall risk.    Baseline TBD    Time 8    Period Weeks    Status New    Target Date 01/22/22                    Plan - 11/27/21 1142     Clinical Impression Statement Patient reports 3 successive falls last May. After her falls, patient noted increased L hip pain. Diagnosed with L hip Bursitis. She received an injection in the L hip in Jan and again Feb 9th. Presents with fluctuating pain in L hip, unable to stand or walk for any distance without pain, limiting her activity. She is TTP in L gluts and piriformis, B ITB.    Examination-Activity Limitations Locomotion Level;Transfers;Sit;Stairs;Squat;Stand    Examination-Participation Restrictions Cleaning;Occupation;Shop    Stability/Clinical Decision Making Evolving/Moderate complexity  Clinical Decision Making Moderate    Rehab Potential Good    PT Frequency 2x / week    PT Duration 8 weeks    PT Treatment/Interventions ADLs/Self Care Home Management;Cryotherapy;Electrical Stimulation;Ultrasound;Moist Heat;Iontophoresis 4mg /ml Dexamethasone;Gait training;Stair training;Functional mobility  training;Therapeutic activities;Therapeutic exercise;Balance training;Neuromuscular re-education;Manual techniques;Dry needling;Passive range of motion;Vasopneumatic Device;Patient/family education    PT Next Visit Plan 5 x STS, 6 minute walk test, FGA, Update HEp with strength exercises.    PT Home Exercise Plan Signed   Access Code: [email or web address]  URL: [email or web address]  Date: 11/27/2021  Prepared by: Ethel Rana     Exercises  Supine Hamstring Stretch - 1 x daily - 7 x weekly - 3 sets - 15 hold  Supine Figure 4 Piriformis Stretch - 1 x daily - 7 x weekly - 3 sets - 15 hold  Supine Piriformis Stretch with Leg Straight - 1 x daily - 7 x weekly - 3 sets - 15 hold             Last Modified by Marcelina Morel, PT on 11/27/2021 at 11:33 AM    Consulted and Agree with Plan of Care Patient             Patient will benefit from skilled therapeutic intervention in order to improve the following deficits and impairments:  Abnormal gait, Decreased coordination, Decreased range of motion, Difficulty walking, Pain, Decreased balance, Improper body mechanics, Postural dysfunction, Decreased strength, Decreased mobility, Decreased endurance  Visit Diagnosis: Pain in left hip  Muscle weakness (generalized)  Difficulty in walking, not elsewhere classified  Unsteadiness on feet     Problem List Patient Active Problem List   Diagnosis Date Noted   Irritable bowel syndrome with diarrhea 11/26/2020   Class 1 obesity due to excess calories without serious comorbidity with body mass index (BMI) of 32.0 to 32.9 in adult 11/26/2020   Failed total right knee replacement ([place]) 04/11/2020   Lactose intolerance 12/07/2018   OA (osteoarthritis) of knee 08/18/2016    Marcelina Morel, DPT 11/27/2021, 2:52 PM  [place]. [place], [place], [address] Phone: [phone]   Fax:  [phone]  Name: Jennifer [name] MRN: [id number] Date  of Birth: 06/24/1961

## 2021-11-29 ENCOUNTER — Encounter: Payer: Self-pay | Admitting: Physical Therapy

## 2021-11-29 ENCOUNTER — Ambulatory Visit: Payer: Medicare HMO | Admitting: Physical Therapy

## 2021-11-29 ENCOUNTER — Other Ambulatory Visit: Payer: Self-pay

## 2021-11-29 DIAGNOSIS — R2681 Unsteadiness on feet: Secondary | ICD-10-CM

## 2021-11-29 DIAGNOSIS — M6281 Muscle weakness (generalized): Secondary | ICD-10-CM | POA: Diagnosis not present

## 2021-11-29 DIAGNOSIS — M25552 Pain in left hip: Secondary | ICD-10-CM

## 2021-11-29 DIAGNOSIS — R262 Difficulty in walking, not elsewhere classified: Secondary | ICD-10-CM | POA: Diagnosis not present

## 2021-11-29 NOTE — Therapy (Signed)
[place]. [place], [place], [address] Phone: [phone]   Fax:  [phone]  Physical Therapy Treatment  Patient Details  Name: Jennifer [name] MRN: [id number] Date of Birth: Dec 22, 1960 Referring Provider (PT): Max Patrice Paradise   Encounter Date: 11/29/2021   PT End of Session - 11/29/21 0856     Visit Number 2    Number of Visits 20    Date for PT Re-Evaluation 02/05/22    PT Start Time 0800    PT Stop Time 0841    PT Time Calculation (min) 41 min    Activity Tolerance Patient tolerated treatment well    Behavior During Therapy [facility] for tasks assessed/performed             Past Medical History:  Diagnosis Date   Arthritis    arthritis back and knees   Blood transfusion 05/2011   Hypertension    IBS (irritable bowel syndrome)    Transfusion history    s/p 2012 RTKA ([facility])    Past Surgical History:  Procedure Laterality Date   ABDOMINAL HYSTERECTOMY  1996   partial   BACK SURGERY  2003/2007   discectomy lower back '03/ '07 rods and artificial disk- Fusion done last time.   CERVICAL FUSION  2006   CHOLECYSTECTOMY  2006   COLONOSCOPY     TOTAL KNEE ARTHROPLASTY  04/2011   Right   TOTAL KNEE ARTHROPLASTY Left 08/18/2016   Procedure: LEFT TOTAL KNEE ARTHROPLASTY;  Surgeon: Gaynelle Arabian, MD;  Location: [facility];  Service: Orthopedics;  Laterality: Left;   TOTAL KNEE REVISION Right 04/11/2020   Procedure: Right knee polyethylene exchange;  Surgeon: Gaynelle Arabian, MD;  Location: [facility];  Service: Orthopedics;  Laterality: Right;  17min    There were no vitals filed for this visit.   Subjective Assessment - 11/29/21 0800     Subjective Patient reports she performed HEP without any problems.    Currently in Pain? No/denies                [facility] PT Assessment - 11/29/21 0001       6 minute walk test results    Aerobic Endurance Distance Walked 1100      Standardized Balance Assessment    Standardized Balance Assessment Five Times Sit to Stand    Five times sit to stand comments  14.80      Functional Gait  Assessment   Gait assessed  Yes    Gait Level Surface Walks 20 ft in less than 5.5 sec, no assistive devices, good speed, no evidence for imbalance, normal gait pattern, deviates no more than 6 in outside of the 12 in walkway width.    Change in Gait Speed Able to smoothly change walking speed without loss of balance or gait deviation. Deviate no more than 6 in outside of the 12 in walkway width.    Gait with Horizontal Head Turns Performs head turns smoothly with slight change in gait velocity (eg, minor disruption to smooth gait path), deviates 6-10 in outside 12 in walkway width, or uses an assistive device.    Gait with Vertical Head Turns Performs task with slight change in gait velocity (eg, minor disruption to smooth gait path), deviates 6 - 10 in outside 12 in walkway width or uses assistive device    Gait and Pivot Turn Pivot turns safely within 3 sec and stops quickly with no loss of balance.    Step Over  Obstacle Is able to step over one shoe box (4.5 in total height) but must slow down and adjust steps to clear box safely. May require verbal cueing.    Gait with Narrow Base of Support Ambulates 7-9 steps.    Gait with Eyes Closed Walks 20 ft, uses assistive device, slower speed, mild gait deviations, deviates 6-10 in outside 12 in walkway width. Ambulates 20 ft in less than 9 sec but greater than 7 sec.    Ambulating Backwards Walks 20 ft, uses assistive device, slower speed, mild gait deviations, deviates 6-10 in outside 12 in walkway width.    Steps Alternating feet, must use rail.    Total Score 22                           [facility] Adult PT Treatment/Exercise - 11/29/21 0001       Exercises   Exercises Knee/Hip      Knee/Hip Exercises: Aerobic   Nustep L5 x 5 minutes      Knee/Hip Exercises: Standing   Hip Abduction Stengthening;Both;1  set;10 reps    Abduction Limitations Side<>side step against G Tband resistance.    Functional Squat 1 set;10 reps    Functional Squat Limitations Green Theraband      Knee/Hip Exercises: Supine   Bridges Strengthening;Both;1 set;10 reps    Bridges with Clamshell Strengthening;Both;1 set;10 reps    Other Supine Knee/Hip Exercises Sup clamshells 10 reps, G Tband resistance.                     PT Education - 11/29/21 0855     Education Details Updated HEP.    Person(s) Educated Patient    Methods Explanation;Demonstration;Handout    Comprehension Returned demonstration;Verbalized understanding              PT Short Term Goals - 11/27/21 1446       PT SHORT TERM GOAL #1   Title I with basic HEP.    Time 4    Period Weeks    Status New    Target Date 12/25/21               PT Long Term Goals - 11/27/21 1447       PT LONG TERM GOAL #1   Title Patient to be independent with advanced HEP    Time 8    Period Weeks    Status New    Target Date 01/22/22      PT LONG TERM GOAL #2   Title Patient will walk at least 600' without increased pain in L hip    Baseline Unable to walk without pain.    Time 8    Period Weeks    Status New    Target Date 01/22/22      PT LONG TERM GOAL #3   Title patient will score < 20 sec in 5 x STS test to show improved BLE functional strength..    Baseline TBD    Time 8    Period Weeks    Status New    Target Date 01/22/22      PT LONG TERM GOAL #4   Title Pateint will score at lest 24 on FGA test to demosntrate low fall risk.    Baseline TBD    Time 8    Period Weeks    Status New    Target Date 01/22/22  Plan - 11/29/21 0856     Clinical Impression Statement Patient arrives with no noticable gait deviation. Completed formal assessments for baseline and then updated HEP to include strengthening. she tolerated all activities very well.    Examination-Activity Limitations Locomotion  Level;Transfers;Sit;Stairs;Squat;Stand    Examination-Participation Restrictions Cleaning;Occupation;Shop    Stability/Clinical Decision Making Evolving/Moderate complexity    Clinical Decision Making Low    Rehab Potential Good    PT Frequency 2x / week    PT Duration 8 weeks    PT Treatment/Interventions ADLs/Self Care Home Management;Cryotherapy;Electrical Stimulation;Ultrasound;Moist Heat;Iontophoresis 4mg /ml Dexamethasone;Gait training;Stair training;Functional mobility training;Therapeutic activities;Therapeutic exercise;Balance training;Neuromuscular re-education;Manual techniques;Dry needling;Passive range of motion;Vasopneumatic Device;Patient/family education    PT Next Visit Plan Update HEP to include trunk stabilization, heelcord strengthening.    PT Home Exercise Plan Signed   Access Code: [email or web address]    Consulted and Agree with Plan of Care Patient             Patient will benefit from skilled therapeutic intervention in order to improve the following deficits and impairments:  Abnormal gait, Decreased coordination, Decreased range of motion, Difficulty walking, Pain, Decreased balance, Improper body mechanics, Postural dysfunction, Decreased strength, Decreased mobility, Decreased endurance  Visit Diagnosis: Pain in left hip  Muscle weakness (generalized)  Difficulty in walking, not elsewhere classified  Unsteadiness on feet     Problem List Patient Active Problem List   Diagnosis Date Noted   Irritable bowel syndrome with diarrhea 11/26/2020   Class 1 obesity due to excess calories without serious comorbidity with body mass index (BMI) of 32.0 to 32.9 in adult 11/26/2020   Failed total right knee replacement ([place]) 04/11/2020   Lactose intolerance 12/07/2018   OA (osteoarthritis) of knee 08/18/2016    Marcelina Morel, DPT 11/29/2021, 9:03 AM  [place]. [place], [place],  [address] Phone: [phone]   Fax:  [phone]  Name: Jennifer [name] MRN: [id number] Date of Birth: [birth year]

## 2021-11-29 NOTE — Patient Instructions (Signed)
Access Code: [email or web address] URL: [email or web address] Date: 11/29/2021 Prepared by: Ethel Rana  Exercises Supine Bridge - 1 x daily - 7 x weekly - 2 sets - 10 reps Hooklying Clamshell with Resistance - 1 x daily - 7 x weekly - 2 sets - 10 reps Squat with Chair Touch and Resistance Loop - 1 x daily - 7 x weekly - 2 sets - 10 reps Side Stepping with Resistance at Thighs - 1 x daily - 7 x weekly - 2 sets - 10 reps

## 2021-12-03 ENCOUNTER — Ambulatory Visit: Payer: Medicare HMO | Admitting: Physical Therapy

## 2021-12-03 ENCOUNTER — Encounter: Payer: Self-pay | Admitting: Physical Therapy

## 2021-12-03 ENCOUNTER — Other Ambulatory Visit: Payer: Self-pay

## 2021-12-03 DIAGNOSIS — R2681 Unsteadiness on feet: Secondary | ICD-10-CM | POA: Diagnosis not present

## 2021-12-03 DIAGNOSIS — R262 Difficulty in walking, not elsewhere classified: Secondary | ICD-10-CM | POA: Diagnosis not present

## 2021-12-03 DIAGNOSIS — M6281 Muscle weakness (generalized): Secondary | ICD-10-CM

## 2021-12-03 DIAGNOSIS — M25552 Pain in left hip: Secondary | ICD-10-CM

## 2021-12-03 NOTE — Therapy (Signed)
[address] [place], [place], [address] Phone: [phone]   Fax:  [phone]  Physical Therapy Treatment  Patient Details  Name: Jennifer [name] MRN: [id number] Date of Birth: 1961-04-27 Referring Provider (PT): Max Patrice Paradise   Encounter Date: 12/03/2021   PT End of Session - 12/03/21 0838     Visit Number 3    Date for PT Re-Evaluation 02/05/22    PT Start Time 0800    PT Stop Time 0843    PT Time Calculation (min) 43 min    Activity Tolerance Patient tolerated treatment well    Behavior During Therapy [facility] for tasks assessed/performed             Past Medical History:  Diagnosis Date   Arthritis    arthritis back and knees   Blood transfusion 05/2011   Hypertension    IBS (irritable bowel syndrome)    Transfusion history    s/p 2012 RTKA ([facility])    Past Surgical History:  Procedure Laterality Date   ABDOMINAL HYSTERECTOMY  1996   partial   BACK SURGERY  2003/2007   discectomy lower back '03/ '07 rods and artificial disk- Fusion done last time.   CERVICAL FUSION  2006   CHOLECYSTECTOMY  2006   COLONOSCOPY     TOTAL KNEE ARTHROPLASTY  04/2011   Right   TOTAL KNEE ARTHROPLASTY Left 08/18/2016   Procedure: LEFT TOTAL KNEE ARTHROPLASTY;  Surgeon: Gaynelle Arabian, MD;  Location: [facility];  Service: Orthopedics;  Laterality: Left;   TOTAL KNEE REVISION Right 04/11/2020   Procedure: Right knee polyethylene exchange;  Surgeon: Gaynelle Arabian, MD;  Location: [facility];  Service: Orthopedics;  Laterality: Right;  59min    There were no vitals filed for this visit.   Subjective Assessment - 12/03/21 0802     Subjective Feeling good, a little sore form moving furniture around yesterday    Currently in Pain? No/denies                               [facility] Adult PT Treatment/Exercise - 12/03/21 0001       Knee/Hip Exercises: Aerobic   Nustep L5 x 6 minutes      Knee/Hip Exercises: Machines  for Strengthening   Cybex Knee Extension 10lb 2x10    Cybex Knee Flexion 25lb 2x10      Knee/Hip Exercises: Standing   Lateral Step Up Both;1 set;10 reps;Hand Hold: 0;Step Height: 6"    Forward Step Up Both;1 set;10 reps;Hand Hold: 0;Step Height: 6"    Walking with Sports Cord 30lb side steps x5 each      Knee/Hip Exercises: Seated   Ball Squeeze 2x10  3'' hold    Sit to Sand 1 set;10 reps;without UE support                       PT Short Term Goals - 11/27/21 1446       PT SHORT TERM GOAL #1   Title I with basic HEP.    Time 4    Period Weeks    Status New    Target Date 12/25/21               PT Long Term Goals - 11/27/21 1447       PT LONG TERM GOAL #1   Title Patient to be independent with advanced HEP  Time 8    Period Weeks    Status New    Target Date 01/22/22      PT LONG TERM GOAL #2   Title Patient will walk at least 600' without increased pain in L hip    Baseline Unable to walk without pain.    Time 8    Period Weeks    Status New    Target Date 01/22/22      PT LONG TERM GOAL #3   Title patient will score < 20 sec in 5 x STS test to show improved BLE functional strength..    Baseline TBD    Time 8    Period Weeks    Status New    Target Date 01/22/22      PT LONG TERM GOAL #4   Title Pateint will score at lest 24 on FGA test to demosntrate low fall risk.    Baseline TBD    Time 8    Period Weeks    Status New    Target Date 01/22/22                   Plan - 12/03/21 9191     Clinical Impression Statement Pt tolerated treatment well evident by no subjective reports of increase pain. no issues completing today's interventions. Some LE fatigue reported with step ups and sit to stands. Cue to prevent narrow base of support with resisted side steps . Some LE burning with isolation interventions.    Examination-Activity Limitations Locomotion Level;Transfers;Sit;Stairs;Squat;Stand    Stability/Clinical Decision  Making Evolving/Moderate complexity    Rehab Potential Good    PT Frequency 2x / week    PT Duration 8 weeks    PT Treatment/Interventions ADLs/Self Care Home Management;Cryotherapy;Electrical Stimulation;Ultrasound;Moist Heat;Iontophoresis 4mg /ml Dexamethasone;Gait training;Stair training;Functional mobility training;Therapeutic activities;Therapeutic exercise;Balance training;Neuromuscular re-education;Manual techniques;Dry needling;Passive range of motion;Vasopneumatic Device;Patient/family education    PT Next Visit Plan trunk stabilization, heelcord strengthening.             Patient will benefit from skilled therapeutic intervention in order to improve the following deficits and impairments:  Abnormal gait, Decreased coordination, Decreased range of motion, Difficulty walking, Pain, Decreased balance, Improper body mechanics, Postural dysfunction, Decreased strength, Decreased mobility, Decreased endurance  Visit Diagnosis: Pain in left hip  Muscle weakness (generalized)  Difficulty in walking, not elsewhere classified     Problem List Patient Active Problem List   Diagnosis Date Noted   Irritable bowel syndrome with diarrhea 11/26/2020   Class 1 obesity due to excess calories without serious comorbidity with body mass index (BMI) of 32.0 to 32.9 in adult 11/26/2020   Failed total right knee replacement ([place]) 04/11/2020   Lactose intolerance 12/07/2018   OA (osteoarthritis) of knee 08/18/2016    Scot Jun, PTA 12/03/2021, 8:41 AM  [address] [place], [place], [address] Phone: [phone]   Fax:  [phone]  Name: Jennifer [name] MRN: [id number] Date of Birth: 01/26/1961

## 2021-12-06 ENCOUNTER — Encounter: Payer: Self-pay | Admitting: Physical Therapy

## 2021-12-06 ENCOUNTER — Other Ambulatory Visit: Payer: Self-pay

## 2021-12-06 ENCOUNTER — Ambulatory Visit: Payer: Medicare HMO | Admitting: Physical Therapy

## 2021-12-06 DIAGNOSIS — M6281 Muscle weakness (generalized): Secondary | ICD-10-CM | POA: Diagnosis not present

## 2021-12-06 DIAGNOSIS — M25552 Pain in left hip: Secondary | ICD-10-CM

## 2021-12-06 DIAGNOSIS — R2681 Unsteadiness on feet: Secondary | ICD-10-CM | POA: Diagnosis not present

## 2021-12-06 DIAGNOSIS — R262 Difficulty in walking, not elsewhere classified: Secondary | ICD-10-CM | POA: Diagnosis not present

## 2021-12-06 NOTE — Therapy (Signed)
[facility] [address] [place], [place], [address] Phone: [phone]   Fax:  [phone]  Physical Therapy Treatment  Patient Details  Name: Jennifer [name] MRN: [id number] Date of Birth: [birth year] Referring Provider (PT): Sharolyn Douglas   Encounter Date: 12/06/2021   PT End of Session - 12/06/21 0919     Visit Number 4    Date for PT Re-Evaluation 02/05/22    PT Start Time 0841    PT Stop Time 0920    PT Time Calculation (min) 39 min    Activity Tolerance Patient tolerated treatment well    Behavior During Therapy [facility] for tasks assessed/performed             Past Medical History:  Diagnosis Date   Arthritis    arthritis back and knees   Blood transfusion 05/2011   Hypertension    IBS (irritable bowel syndrome)    Transfusion history    s/p 2012 RTKA ([facility])    Past Surgical History:  Procedure Laterality Date   ABDOMINAL HYSTERECTOMY  1996   partial   BACK SURGERY  2003/2007   discectomy lower back '03/ '07 rods and artificial disk- Fusion done last time.   CERVICAL FUSION  2006   CHOLECYSTECTOMY  2006   COLONOSCOPY     TOTAL KNEE ARTHROPLASTY  04/2011   Right   TOTAL KNEE ARTHROPLASTY Left 08/18/2016   Procedure: LEFT TOTAL KNEE ARTHROPLASTY;  Surgeon: Ollen Gross, MD;  [facility];  Service: Orthopedics;  Laterality: Left;   TOTAL KNEE REVISION Right 04/11/2020   Procedure: Right knee polyethylene exchange;  Surgeon: Ollen Gross, MD;  [facility];  Service: Orthopedics;  Laterality: Right;     There were no vitals filed for this visit.   Subjective Assessment - 12/06/21 0839     Subjective A little tired today form having her grandson this week. No pain but does have some muscle soreness    Currently in Pain? No/denies                               [facility] Adult PT Treatment/Exercise - 12/06/21 0001       Knee/Hip Exercises: Aerobic   Nustep L5 x 6 minutes       Knee/Hip Exercises: Machines for Strengthening   Cybex Knee Extension 10lb 2x10    Cybex Knee Flexion 25lb 2x10      Knee/Hip Exercises: Standing   Lateral Step Up Both;1 set;10 reps;Hand Hold: 0;Step Height: 6"    Forward Step Up Both;1 set;10 reps;Hand Hold: 0;Step Height: 6"    Walking with Sports Cord 20lb side step over hald foam roll x10 eah      Knee/Hip Exercises: Seated   Ball Squeeze 2x10  3'' hold    Sit to [employer] 10 reps;without UE support;2 sets   holding yellow ball                      PT Short Term Goals - 12/06/21 4309       PT SHORT TERM GOAL #1   Title I with basic HEP.    Status Partially Met               PT Long Term Goals - 12/06/21 2444       PT LONG TERM GOAL #1   Title Patient to be independent with advanced HEP  Status On-going      PT LONG TERM GOAL #2   Title Patient will walk at least 600' without increased pain in L hip    Status Partially Met      PT LONG TERM GOAL #3   Title patient will score < 20 sec in 5 x STS test to show improved BLE functional strength..    Status On-going      PT LONG TERM GOAL #4   Title Pateint will score at lest 24 on FGA test to demosntrate low fall risk.    Status On-going                   Plan - 12/06/21 0920     Clinical Impression Statement Pt enters session stated she had to leave a little early. She reports no issues overall bu does have some muscle soreness form last session. Side step over foam roll was challenging but she was able to complete. Some LE weakness noted with lateral step ups. muscle burning and fatigue with legs curls and extensions.    Examination-Activity Limitations Locomotion Level;Transfers;Sit;Stairs;Squat;Stand    Examination-Participation Restrictions Cleaning;Occupation;Shop    Stability/Clinical Decision Making Evolving/Moderate complexity    Rehab Potential Good    PT Frequency 2x / week    PT Duration 8 weeks    PT Treatment/Interventions  ADLs/Self Care Home Management;Cryotherapy;Electrical Stimulation;Ultrasound;Moist Heat;Iontophoresis 4mg /ml Dexamethasone;Gait training;Stair training;Functional mobility training;Therapeutic activities;Therapeutic exercise;Balance training;Neuromuscular re-education;Manual techniques;Dry needling;Passive range of motion;Vasopneumatic Device;Patient/family education    PT Next Visit Plan trunk stabilization, heelcord strengthening.             Patient will benefit from skilled therapeutic intervention in order to improve the following deficits and impairments:  Abnormal gait, Decreased coordination, Decreased range of motion, Difficulty walking, Pain, Decreased balance, Improper body mechanics, Postural dysfunction, Decreased strength, Decreased mobility, Decreased endurance  Visit Diagnosis: Pain in left hip  Difficulty in walking, not elsewhere classified  Muscle weakness (generalized)  Unsteadiness on feet     Problem List Patient Active Problem List   Diagnosis Date Noted   Irritable bowel syndrome with diarrhea 11/26/2020   Class 1 obesity due to excess calories without serious comorbidity with body mass index (BMI) of 32.0 to 32.9 in adult 11/26/2020   Failed total right knee replacement ([place]) 04/11/2020   Lactose intolerance 12/07/2018   OA (osteoarthritis) of knee 08/18/2016    Scot Jun, PTA 12/06/2021, 9:23 AM  [address] [place], [place], [address] Phone: [phone]   Fax:  [phone]  Name: Jennifer [name] MRN: [id number] Date of Birth: 1961/06/17

## 2021-12-10 ENCOUNTER — Encounter: Payer: Self-pay | Admitting: Physical Therapy

## 2021-12-10 ENCOUNTER — Other Ambulatory Visit: Payer: Self-pay

## 2021-12-10 ENCOUNTER — Ambulatory Visit: Payer: Medicare HMO | Admitting: Physical Therapy

## 2021-12-10 DIAGNOSIS — M25552 Pain in left hip: Secondary | ICD-10-CM

## 2021-12-10 DIAGNOSIS — R262 Difficulty in walking, not elsewhere classified: Secondary | ICD-10-CM | POA: Diagnosis not present

## 2021-12-10 DIAGNOSIS — M6281 Muscle weakness (generalized): Secondary | ICD-10-CM

## 2021-12-10 DIAGNOSIS — R2681 Unsteadiness on feet: Secondary | ICD-10-CM | POA: Diagnosis not present

## 2021-12-10 NOTE — Therapy (Addendum)
[address] [place], [place], [address] Phone: [phone]   Fax:  [phone]  Physical Therapy Treatment  Patient Details  Name: Jennifer [name] MRN: [id number] Date of Birth: 24-Feb-1961 Referring Provider (PT): Max Patrice Paradise   Encounter Date: 12/10/2021   PT End of Session - 12/10/21 1603     Visit Number 5    Date for PT Re-Evaluation 02/05/22    PT Start Time 1345    PT Stop Time 1430    PT Time Calculation (min) 45 min    Activity Tolerance Patient tolerated treatment well    Behavior During Therapy [facility] for tasks assessed/performed             Past Medical History:  Diagnosis Date   Arthritis    arthritis back and knees   Blood transfusion 05/2011   Hypertension    IBS (irritable bowel syndrome)    Transfusion history    s/p 2012 RTKA ([facility])    Past Surgical History:  Procedure Laterality Date   ABDOMINAL HYSTERECTOMY  1996   partial   BACK SURGERY  2003/2007   discectomy lower back '03/ '07 rods and artificial disk- Fusion done last time.   CERVICAL FUSION  2006   CHOLECYSTECTOMY  2006   COLONOSCOPY     TOTAL KNEE ARTHROPLASTY  04/2011   Right   TOTAL KNEE ARTHROPLASTY Left 08/18/2016   Procedure: LEFT TOTAL KNEE ARTHROPLASTY;  Surgeon: Gaynelle Arabian, MD;  [facility];  Service: Orthopedics;  Laterality: Left;   TOTAL KNEE REVISION Right 04/11/2020   Procedure: Right knee polyethylene exchange;  Surgeon: Gaynelle Arabian, MD;  [facility];  Service: Orthopedics;  Laterality: Right;  72min    There were no vitals filed for this visit.   Subjective Assessment - 12/10/21 1346     Subjective "Good" Some soreness in the calves    Currently in Pain? No/denies                [facility] PT Assessment - 12/10/21 0001       Standardized Balance Assessment   Standardized Balance Assessment Five Times Sit to Stand    Five times sit to stand comments  13.92                            [facility] Adult PT Treatment/Exercise - 12/10/21 0001       Ambulation/Gait   Ambulation/Gait Yes    Assistive device None    Gait Pattern Within Functional Limits    Ambulation Surface Outdoor;Paved    Gait Comments Gait outside around back building, up and down slope      Knee/Hip Exercises: Stretches   Press photographer Both;4 reps;10 seconds      Knee/Hip Exercises: Aerobic   Nustep L5 x 6 minutes      Knee/Hip Exercises: Machines for Strengthening   Cybex Knee Extension 10lb 2x10    Cybex Knee Flexion 25lb 2x10    Cybex Leg Press 20lb 2x10                       PT Short Term Goals - 12/06/21 2831       PT SHORT TERM GOAL #1   Title I with basic HEP.    Status Partially Met               PT Long Term Goals - 12/10/21 1419  PT LONG TERM GOAL #1   Title Patient to be independent with advanced HEP    Status Achieved      PT LONG TERM GOAL #2   Title Patient will walk at least 600' without increased pain in L hip    Status Achieved      PT LONG TERM GOAL #3   Title patient will score < 20 sec in 5 x STS test to show improved BLE functional strength..    Status Achieved                   Plan - 12/10/21 1424     Clinical Impression Statement Pt enters clinic feeling well. She has progressed meeting most of her LTG's. Pt able to progress with outdoor ambulation without increase pain. She stated that she felt comfortable with her stretches and will walk on her own. She reports being pleased with her current functional status. LOB x 1 with lateral step ups requiring min to mod assist to regain balance.    Examination-Activity Limitations Locomotion Level;Transfers;Sit;Stairs;Squat;Stand    Examination-Participation Restrictions Cleaning;Occupation;Shop    Stability/Clinical Decision Making Evolving/Moderate complexity    Rehab Potential Good    PT Frequency 2 x / week    PT Duration 8 weeks    PT  Treatment/Interventions ADLs/Self Care Home Management;Cryotherapy;Electrical Stimulation;Ultrasound;Moist Heat;Iontophoresis 4mg /ml Dexamethasone;Gait training;Stair training;Functional mobility training;Therapeutic activities;Therapeutic exercise;Balance training;Neuromuscular re-education;Manual techniques;Dry needling;Passive range of motion;Vasopneumatic Device;Patient/family education    PT Next Visit Plan D/C PT             Patient will benefit from skilled therapeutic intervention in order to improve the following deficits and impairments:  Abnormal gait, Decreased coordination, Decreased range of motion, Difficulty walking, Pain, Decreased balance, Improper body mechanics, Postural dysfunction, Decreased strength, Decreased mobility, Decreased endurance  Visit Diagnosis: Pain in left hip  Muscle weakness (generalized)  Unsteadiness on feet  Difficulty in walking, not elsewhere classified     Problem List Patient Active Problem List   Diagnosis Date Noted   Irritable bowel syndrome with diarrhea 11/26/2020   Class 1 obesity due to excess calories without serious comorbidity with body mass index (BMI) of 32.0 to 32.9 in adult 11/26/2020   Failed total right knee replacement ([place]) 04/11/2020   Lactose intolerance 12/07/2018   OA (osteoarthritis) of knee 08/18/2016   PHYSICAL THERAPY DISCHARGE SUMMARY  Visits from Start of Care: 4  Patient agrees to discharge. Patient goals were partially met. Patient is being discharged due to being pleased with the current functional level.   Scot Jun, PTA 12/10/2021, 4:03 PM  [place]. [place], [place], [address] Phone: [phone]   Fax:  [phone]  Name: Jennifer [name] MRN: [id number] Date of Birth: Sep 17, 1961

## 2021-12-11 ENCOUNTER — Other Ambulatory Visit: Payer: Self-pay | Admitting: Internal Medicine

## 2021-12-11 DIAGNOSIS — Z1331 Encounter for screening for depression: Secondary | ICD-10-CM | POA: Diagnosis not present

## 2021-12-11 DIAGNOSIS — Z7189 Other specified counseling: Secondary | ICD-10-CM | POA: Diagnosis not present

## 2021-12-11 DIAGNOSIS — E041 Nontoxic single thyroid nodule: Secondary | ICD-10-CM

## 2021-12-11 DIAGNOSIS — Z6833 Body mass index (BMI) 33.0-33.9, adult: Secondary | ICD-10-CM | POA: Diagnosis not present

## 2021-12-11 DIAGNOSIS — K591 Functional diarrhea: Secondary | ICD-10-CM | POA: Diagnosis not present

## 2021-12-11 DIAGNOSIS — I10 Essential (primary) hypertension: Secondary | ICD-10-CM | POA: Diagnosis not present

## 2021-12-11 DIAGNOSIS — M79604 Pain in right leg: Secondary | ICD-10-CM | POA: Diagnosis not present

## 2021-12-11 DIAGNOSIS — Z0000 Encounter for general adult medical examination without abnormal findings: Secondary | ICD-10-CM | POA: Diagnosis not present

## 2021-12-11 DIAGNOSIS — Z79899 Other long term (current) drug therapy: Secondary | ICD-10-CM | POA: Diagnosis not present

## 2021-12-16 ENCOUNTER — Ambulatory Visit
Admission: RE | Admit: 2021-12-16 | Discharge: 2021-12-16 | Disposition: A | Discharge status: Home / Self Care | Payer: Medicare HMO | Source: Ambulatory Visit | Attending: Internal Medicine | Admitting: Internal Medicine

## 2021-12-16 DIAGNOSIS — E041 Nontoxic single thyroid nodule: Secondary | ICD-10-CM | POA: Diagnosis not present

## 2021-12-16 DIAGNOSIS — M79604 Pain in right leg: Secondary | ICD-10-CM | POA: Diagnosis not present

## 2021-12-16 DIAGNOSIS — M79605 Pain in left leg: Secondary | ICD-10-CM | POA: Diagnosis not present

## 2022-01-21 DIAGNOSIS — I10 Essential (primary) hypertension: Secondary | ICD-10-CM | POA: Diagnosis not present

## 2022-01-21 DIAGNOSIS — Z79899 Other long term (current) drug therapy: Secondary | ICD-10-CM | POA: Diagnosis not present

## 2022-02-12 DIAGNOSIS — H52223 Regular astigmatism, bilateral: Secondary | ICD-10-CM | POA: Diagnosis not present

## 2022-02-12 DIAGNOSIS — Z961 Presence of intraocular lens: Secondary | ICD-10-CM | POA: Diagnosis not present

## 2022-02-12 DIAGNOSIS — H524 Presbyopia: Secondary | ICD-10-CM | POA: Diagnosis not present

## 2022-06-02 ENCOUNTER — Encounter: Payer: Self-pay | Admitting: Internal Medicine

## 2022-06-09 ENCOUNTER — Ambulatory Visit (AMBULATORY_SURGERY_CENTER): Payer: Medicare HMO

## 2022-06-09 VITALS — Ht 65.0 in | Wt 195.0 lb

## 2022-06-09 DIAGNOSIS — Z1211 Encounter for screening for malignant neoplasm of colon: Secondary | ICD-10-CM

## 2022-06-09 NOTE — Progress Notes (Signed)
No egg or soy allergy known to patient  No issues known to pt with past sedation with any surgeries or procedures Patient denies ever being told they had issues or difficulty with intubation  No FH of Malignant Hyperthermia Pt is not on diet pills Pt is not on  home 02  Pt is not on blood thinners  Pt denies issues with constipation  No A fib or A flutter Have any cardiac testing pending--denied Pt instructed to use [email or web address] or GoodRx for a price reduction on prep   

## 2022-06-17 ENCOUNTER — Encounter: Payer: Self-pay | Admitting: Internal Medicine

## 2022-06-22 ENCOUNTER — Encounter: Payer: Self-pay | Admitting: Certified Registered Nurse Anesthetist

## 2022-06-29 NOTE — Progress Notes (Unsigned)
[facility] History and Physical   Primary Care Physician:  Ernestene Kiel, MD   Reason for Procedure:   Colon cancer screening  Plan:    colonoscopy     HPI: Jennifer [name] is a 60 y.o. female w/ prior colonoscopy 2013 - severe diverticulosis but no polyps. For repeat screening exam today.   Past Medical History:  Diagnosis Date   Arthritis    arthritis back and knees   Blood transfusion 05/2011   Hypertension    IBS (irritable bowel syndrome)    Transfusion history    s/p 2012 RTKA ([facility])    Past Surgical History:  Procedure Laterality Date   ABDOMINAL HYSTERECTOMY  1996   partial   BACK SURGERY  2003/2007   discectomy lower back '03/ '07 rods and artificial disk- Fusion done last time.   CERVICAL FUSION  2006   CHOLECYSTECTOMY  2006   COLONOSCOPY     TOTAL KNEE ARTHROPLASTY  04/2011   Right   TOTAL KNEE ARTHROPLASTY Left 08/18/2016   Procedure: LEFT TOTAL KNEE ARTHROPLASTY;  Surgeon: Gaynelle Arabian, MD;  [facility];  Service: Orthopedics;  Laterality: Left;   TOTAL KNEE REVISION Right 04/11/2020   Procedure: Right knee polyethylene exchange;  Surgeon: Gaynelle Arabian, MD;  [facility]: [facility];  Service: Orthopedics;  Laterality: Right;  69mn    Prior to Admission medications   Medication Sig Start Date End Date Taking? Authorizing Provider  cholestyramine (QUESTRAN) 4 GM/DOSE powder Take 4 g by mouth daily as needed (stomach pain).    [provider]  dicyclomine (BENTYL) 20 MG tablet Take 1 tablet (20 mg total) by mouth every 6 (six) hours as needed for spasms (diarrhea, abdominal cramps). 10/19/20   GGatha Mayer MD  estradiol (ESTRACE) 1 MG tablet Take 1 mg by mouth daily.     [provider]  fluticasone (FLONASE) 50 MCG/ACT nasal spray Place 1 spray into both nostrils daily as needed for allergies or rhinitis.    [provider]  hydrochlorothiazide (HYDRODIURIL) 25 MG tablet Take 25 mg by mouth daily.    [provider]  loratadine (CLARITIN) 10 MG tablet Take 10 mg by mouth daily as needed for allergies.    [provider]  meloxicam (MOBIC) 15 MG tablet Take 1 tablet every day by oral route.    [provider]  Multiple Vitamins-Minerals (MULTIVITAMIN WITH MINERALS) tablet Take 1 tablet by mouth daily.    [provider]  traMADol (ULTRAM) 50 MG tablet Take 50 mg by mouth every 6 (six) hours as needed for moderate pain.     [provider]  Zinc Sulfate (ZINC 15 PO) Take 1 tablet by mouth daily.    [provider]    Current Outpatient Medications  Medication Sig Dispense Refill   cholestyramine (QUESTRAN) 4 GM/DOSE powder Take 4 g by mouth daily as needed (stomach pain).     dicyclomine (BENTYL) 20 MG tablet Take 1 tablet (20 mg total) by mouth every 6 (six) hours as needed for spasms (diarrhea, abdominal cramps). 60 tablet 0   estradiol (ESTRACE) 1 MG tablet Take 1 mg by mouth daily.      fluticasone (FLONASE) 50 MCG/ACT nasal spray Place 1 spray into both nostrils daily as needed for allergies or rhinitis.     hydrochlorothiazide (HYDRODIURIL) 25 MG tablet Take 25 mg by mouth daily.     loratadine (CLARITIN) 10 MG tablet Take 10 mg by mouth daily as needed for  allergies.     meloxicam (MOBIC) 15 MG tablet Take 1 tablet every day by oral route.     Multiple Vitamins-Minerals (MULTIVITAMIN WITH MINERALS) tablet Take 1 tablet by mouth daily.     traMADol (ULTRAM) 50 MG tablet Take 50 mg by mouth every 6 (six) hours as needed for moderate pain.      Zinc Sulfate (ZINC 15 PO) Take 1 tablet by mouth daily.     No current facility-administered medications for this visit.    Allergies as of 06/30/2022 - Review Complete 06/22/2022  Allergen Reaction Noted   Darvocet [propoxyphene n-acetaminophen] Swelling 01/25/2012   Latex Hives 08/06/2016   Lodine [etodolac] Swelling 12/31/2011   Methocarbamol Swelling 12/31/2011   Oxycodone  06/09/2022     Family History  Problem Relation Age of Onset   Prostate cancer Father    Diabetes Brother    Crohn's disease Brother    Diabetes Brother    Colon cancer Neg Hx    Esophageal cancer Neg Hx    Pancreatic cancer Neg Hx    Stomach cancer Neg Hx    Liver disease Neg Hx    Colon polyps Neg Hx    Rectal cancer Neg Hx     Social History   Socioeconomic History   Marital status: Widowed    Spouse name: Not on file   Number of children: 3   Years of education: Not on file   Highest education level: Not on file  Occupational History    Employer: DISABLED   Tobacco Use   Smoking status: Never   Smokeless tobacco: Never  Vaping Use   Vaping Use: Never used  Substance and Sexual Activity   Alcohol use: No   Drug use: No   Sexual activity: Not Currently  Other Topics Concern   Not on file  Social History Narrative   She is widowed and she has 3 daughters as grandchildren also   Disabled   Never smoker no other tobacco   No alcohol, no illicit drug use   1 caffeinated beverage daily   Social Determinants of Radio broadcast assistant Strain: Not on file  Food Insecurity: Not on file  Transportation Needs: Not on file  Physical Activity: Not on file  Stress: Not on file  Social Connections: Not on file  Intimate Partner Violence: Not on file    Review of Systems: Positive for *** All other review of systems negative except as mentioned in the HPI.  Physical Exam: Vital signs There were no vitals taken for this visit.  General:   Alert,  Well-developed, well-nourished, pleasant and cooperative in NAD Lungs:  Clear throughout to auscultation.   Heart:  Regular rate and rhythm; no murmurs, clicks, rubs,  or gallops. Abdomen:  Soft, nontender and nondistended. Normal bowel sounds.   Neuro/Psych:  Alert and cooperative. Normal mood and affect. A and O x 3   '@Jessikah Dicker'$  Simonne Maffucci, MD, [facility] [phone] (pager) 06/29/2022 9:12 PM@

## 2022-06-30 ENCOUNTER — Ambulatory Visit (AMBULATORY_SURGERY_CENTER): Payer: Medicare HMO | Admitting: Internal Medicine

## 2022-06-30 ENCOUNTER — Encounter: Payer: Self-pay | Admitting: Internal Medicine

## 2022-06-30 VITALS — BP 116/47 | HR 63 | Temp 95.9°F | Resp 20 | Ht 65.0 in | Wt 195.0 lb

## 2022-06-30 DIAGNOSIS — D120 Benign neoplasm of cecum: Secondary | ICD-10-CM

## 2022-06-30 DIAGNOSIS — I10 Essential (primary) hypertension: Secondary | ICD-10-CM | POA: Diagnosis not present

## 2022-06-30 DIAGNOSIS — Z86010 Personal history of colonic polyps: Secondary | ICD-10-CM | POA: Insufficient documentation

## 2022-06-30 DIAGNOSIS — Z860101 Personal history of adenomatous and serrated colon polyps: Secondary | ICD-10-CM

## 2022-06-30 DIAGNOSIS — Z1211 Encounter for screening for malignant neoplasm of colon: Secondary | ICD-10-CM

## 2022-06-30 HISTORY — DX: Personal history of adenomatous and serrated colon polyps: Z86.0101

## 2022-06-30 MED ORDER — SODIUM CHLORIDE 0.9 % IV SOLN: 500.0000 mL | Freq: Once | INTRAVENOUS | Status: DC

## 2022-06-30 NOTE — Op Note (Signed)
[facility] Patient Name: Jennifer [name] Procedure Date: 06/30/2022 8:19 AM MRN: [id number] Endoscopist: Gatha Mayer , MD Age: 61 Referring MD:  Date of Birth: 01-17-1961 Gender: Female Account #: [id number] Procedure:                Colonoscopy Indications:              Screening for colorectal malignant neoplasm, Last                            colonoscopy: 2013 Medicines:                Monitored Anesthesia Care Procedure:                Pre-Anesthesia Assessment:                           - Prior to the procedure, a History and Physical                            was performed, and patient medications and                            allergies were reviewed. The patient's tolerance of                            previous anesthesia was also reviewed. The risks                            and benefits of the procedure and the sedation                            options and risks were discussed with the patient.                            All questions were answered, and informed consent                            was obtained. Prior Anticoagulants: The patient has                            taken no previous anticoagulant or antiplatelet                            agents. ASA Grade Assessment: II - A patient with                            mild systemic disease. After reviewing the risks                            and benefits, the patient was deemed in                            satisfactory condition to undergo the procedure.  After obtaining informed consent, the colonoscope                            was passed under direct vision. Throughout the                            procedure, the patient's blood pressure, pulse, and                            oxygen saturations were monitored continuously. The                            Olympus CF-HQ190L (Serial# [id number]) Colonoscope was                            introduced through the anus and advanced to  the the                            cecum, identified by appendiceal orifice and                            ileocecal valve. The colonoscopy was performed                            without difficulty. The patient tolerated the                            procedure well. The quality of the bowel                            preparation was good. The ileocecal valve,                            appendiceal orifice, and rectum were photographed.                            The bowel preparation used was Miralax via split                            dose instruction. Scope In: 8:28:59 AM Scope Out: 8:43:04 AM Scope Withdrawal Time: 0 hours 9 minutes 30 seconds  Total Procedure Duration: 0 hours 14 minutes 5 seconds  Findings:                 The perianal and digital rectal examinations were                            normal.                           A 3 mm polyp was found in the cecum. The polyp was                            sessile. The polyp was removed with a cold snare.  Resection and retrieval were complete. Verification                            of patient identification for the specimen was                            done. Estimated blood loss was minimal.                           Multiple small and large-mouthed diverticula were                            found in the sigmoid colon and ascending colon.                           The exam was otherwise without abnormality on                            direct and retroflexion views. Complications:            No immediate complications. Estimated Blood Loss:     Estimated blood loss was minimal. Impression:               - One 3 mm polyp in the cecum, removed with a cold                            snare. Resected and retrieved.                           - Diverticulosis in the sigmoid colon and in the                            ascending colon.                           - The examination was otherwise normal on  direct                            and retroflexion views. Recommendation:           - Patient has a contact number available for                            emergencies. The signs and symptoms of potential                            delayed complications were discussed with the                            patient. Return to normal activities tomorrow.                            Written discharge instructions were provided to the                            patient.                           -  Resume previous diet.                           - Continue present medications.                           - Repeat colonoscopy is recommended. The                            colonoscopy date will be determined after pathology                            results from today's exam become available for                            review. Gatha Mayer, MD 06/30/2022 8:48:44 AM This report has been signed electronically.

## 2022-06-30 NOTE — Progress Notes (Signed)
Called to room to assist during endoscopic procedure.  Patient ID and intended procedure confirmed with present staff. Received instructions for my participation in the procedure from the performing physician.  

## 2022-06-30 NOTE — Progress Notes (Signed)
0835 Patient experiencing nausea and retching.  MD updated and Zofran 4 mg IV given, vss

## 2022-06-30 NOTE — Patient Instructions (Addendum)
I found and removed one tiny polyp that looks benign.  Diverticulosis was seen again.  I will let you know pathology results and when to have another routine colonoscopy by mail and/or My Chart.  I appreciate the opportunity to care for you. Gatha Mayer, MD, [facility]  Please read handouts provided. Continue present medications. Await pathology results.   YOU HAD AN ENDOSCOPIC PROCEDURE TODAY AT [facility]:   Refer to the procedure report that was given to you for any specific questions about what was found during the examination.  If the procedure report does not answer your questions, please call your gastroenterologist to clarify.  If you requested that your care partner not be given the details of your procedure findings, then the procedure report has been included in a sealed envelope for you to review at your convenience later.  YOU SHOULD EXPECT: Some feelings of bloating in the abdomen. Passage of more gas than usual.  Walking can help get rid of the air that was put into your GI tract during the procedure and reduce the bloating. If you had a lower endoscopy (such as a colonoscopy or flexible sigmoidoscopy) you may notice spotting of blood in your stool or on the toilet paper. If you underwent a bowel prep for your procedure, you may not have a normal bowel movement for a few days.  Please Note:  You might notice some irritation and congestion in your nose or some drainage.  This is from the oxygen used during your procedure.  There is no need for concern and it should clear up in a day or so.  SYMPTOMS TO REPORT IMMEDIATELY:  Following lower endoscopy (colonoscopy or flexible sigmoidoscopy):  Excessive amounts of blood in the stool  Significant tenderness or worsening of abdominal pains  Swelling of the abdomen that is new, acute  Fever of 100F or higher.  For urgent or emergent issues, a gastroenterologist can be reached at any hour by calling [phone]  [phone]. Do not use MyChart messaging for urgent concerns.    DIET:  We do recommend a small meal at first, but then you may proceed to your regular diet.  Drink plenty of fluids but you should avoid alcoholic beverages for 24 hours.  ACTIVITY:  You should plan to take it easy for the rest of today and you should NOT DRIVE or use heavy machinery until tomorrow (because of the sedation medicines used during the test).    FOLLOW UP: Our staff will call the number listed on your records the next business day following your procedure.  We will call around 7:15- 8:00 am to check on you and address any questions or concerns that you may have regarding the information given to you following your procedure. If we do not reach you, we will leave a message.     If any biopsies were taken you will be contacted by phone or by letter within the next 1-3 weeks.  Please call us at [phone] if you have not heard about the biopsies in 3 weeks.    SIGNATURES/CONFIDENTIALITY: You and/or your care partner have signed paperwork which will be entered into your electronic medical record.  These signatures attest to the fact that that the information above on your After Visit Summary has been reviewed and is understood.  Full responsibility of the confidentiality of this discharge information lies with you and/or your care-partner.

## 2022-06-30 NOTE — Progress Notes (Signed)
Report given to PACU, vss 

## 2022-06-30 NOTE — Progress Notes (Signed)
Pt's states no medical or surgical changes since previsit or office visit. 

## 2022-07-01 ENCOUNTER — Ambulatory Visit: Payer: Medicare HMO | Admitting: Podiatry

## 2022-07-01 ENCOUNTER — Telehealth: Payer: Self-pay

## 2022-07-01 ENCOUNTER — Encounter: Payer: Self-pay | Admitting: Podiatry

## 2022-07-01 ENCOUNTER — Other Ambulatory Visit: Payer: Self-pay | Admitting: Podiatry

## 2022-07-01 ENCOUNTER — Ambulatory Visit (INDEPENDENT_AMBULATORY_CARE_PROVIDER_SITE_OTHER): Payer: Medicare HMO

## 2022-07-01 DIAGNOSIS — M722 Plantar fascial fibromatosis: Secondary | ICD-10-CM | POA: Diagnosis not present

## 2022-07-01 DIAGNOSIS — M778 Other enthesopathies, not elsewhere classified: Secondary | ICD-10-CM

## 2022-07-01 MED ORDER — METHYLPREDNISOLONE 4 MG PO TBPK: ORAL_TABLET | ORAL | 0 refills | Status: DC

## 2022-07-01 MED ORDER — TRIAMCINOLONE ACETONIDE 40 MG/ML IJ SUSP
40.0000 mg | Freq: Once | INTRAMUSCULAR | Status: AC
  Administered 2022-07-01: 40 mg

## 2022-07-01 NOTE — Progress Notes (Signed)
Subjective:  Patient ID: Jennifer [name], female    DOB: 09/23/1961,  MRN: [id number] HPI Chief Complaint  Patient presents with   Foot Pain    Medial foot/arch/plantar heel bilateral (L>R) - aching x 6 months+, AM pain, works PT job standing on concrete floors, tried muscle rub-no help   New Patient (Initial Visit)    61 y.o. female presents with the above complaint.   ROS denies fever chills nausea vomiting muscle aches pains calf pain back pain chest pain shortness of breath.  Past Medical History:  Diagnosis Date   Arthritis    arthritis back and knees   Blood transfusion 05/2011   Hypertension    IBS (irritable bowel syndrome)    Transfusion history    s/p 2012 RTKA ([facility])   Past Surgical History:  Procedure Laterality Date   ABDOMINAL HYSTERECTOMY  1996   partial   BACK SURGERY  2003/2007   discectomy lower back '03/ '07 rods and artificial disk- Fusion done last time.   CERVICAL FUSION  2006   CHOLECYSTECTOMY  2006   COLONOSCOPY     TOTAL KNEE ARTHROPLASTY  04/2011   Right   TOTAL KNEE ARTHROPLASTY Left 08/18/2016   Procedure: LEFT TOTAL KNEE ARTHROPLASTY;  Surgeon: Gaynelle Arabian, MD;  Location: [facility];  Service: Orthopedics;  Laterality: Left;   TOTAL KNEE REVISION Right 04/11/2020   Procedure: Right knee polyethylene exchange;  Surgeon: Gaynelle Arabian, MD;  Location: [facility];  Service: Orthopedics;  Laterality: Right;  55mn    Current Outpatient Medications:    methylPREDNISolone (MEDROL DOSEPAK) 4 MG TBPK tablet, 6 day dose pack - take as directed, Disp: 21 tablet, Rfl: 0   cholestyramine (QUESTRAN) 4 GM/DOSE powder, Take 4 g by mouth daily as needed (stomach pain)., Disp: , Rfl:    dicyclomine (BENTYL) 20 MG tablet, Take 1 tablet (20 mg total) by mouth every 6 (six) hours as needed for spasms (diarrhea, abdominal cramps)., Disp: 60 tablet, Rfl: 0   estradiol (ESTRACE) 1 MG tablet, Take 1 mg by mouth daily. , Disp: , Rfl:    fluticasone (FLONASE) 50 MCG/ACT nasal  spray, Place 1 spray into both nostrils daily as needed for allergies or rhinitis., Disp: , Rfl:    hydrochlorothiazide (HYDRODIURIL) 25 MG tablet, Take 25 mg by mouth daily., Disp: , Rfl:    loratadine (CLARITIN) 10 MG tablet, Take 10 mg by mouth daily as needed for allergies., Disp: , Rfl:    Multiple Vitamins-Minerals (MULTIVITAMIN WITH MINERALS) tablet, Take 1 tablet by mouth daily., Disp: , Rfl:    traMADol (ULTRAM) 50 MG tablet, Take 50 mg by mouth every 6 (six) hours as needed for moderate pain. , Disp: , Rfl:    Zinc Sulfate (ZINC 15 PO), Take 1 tablet by mouth daily., Disp: , Rfl:   Allergies  Allergen Reactions   Darvocet [Propoxyphene N-Acetaminophen] Swelling   Latex Hives    "contact only'   Lodine [Etodolac] Swelling   Methocarbamol Swelling   Oxycodone     Other reaction(s): Not available   Review of Systems Objective:  There were no vitals filed for this visit.  General: Well developed, nourished, in no acute distress, alert and oriented x3   Dermatological: Skin is warm, dry and supple bilateral. Nails x 10 are well maintained; remaining integument appears unremarkable at this time. There are no open sores, no preulcerative lesions, no rash or signs of infection present.  Vascular: Dorsalis Pedis artery and Posterior Tibial artery pedal pulses  are 2/4 bilateral with immedate capillary fill time. Pedal hair growth present. No varicosities and no lower extremity edema present bilateral.   Neruologic: Grossly intact via light touch bilateral. Vibratory intact via tuning fork bilateral. Protective threshold with Semmes Wienstein monofilament intact to all pedal sites bilateral. Patellar and Achilles deep tendon reflexes 2+ bilateral. No Babinski or clonus noted bilateral.   Musculoskeletal: No gross boney pedal deformities bilateral. No pain, crepitus, or limitation noted with foot and ankle range of motion bilateral. Muscular strength 5/5 in all groups tested bilateral.   Moderate to severe pain on palpation and range of motion of the dorsal metatarsal joints.  She also has pain on palpation medial calcaneal tubercles both are worse on the left foot than the right.  Gait: Unassisted, Nonantalgic.    Radiographs:  Radiographs taken today demonstrate osseously mature individual bilateral.  Mild osteopenia.  She has some early osteoarthritic changes tarsometatarsal joints bilateral left greater than right.  Small plantar distally oriented calcaneal spurs with soft tissue increase in density of the plantar fascia particularly on the left heel as opposed to the right but there is some present there as well.  Assessment & Plan:   Assessment: Planter fasciitis bilateral left greater than right osteoarthritis midfoot left greater than right  Plan: Injected the bilateral heels today 20 mg Kenalog 5 mg Marcaine and started her on a Medrol Dosepak.  She has meloxicam at home which she will continue.  We did discuss appropriate shoe gear stretching exercise ice therapy and shoe gear modifications.  Placed her in 2 plantar fascia braces.  Follow-up with her in 1 month.     Montez Cuda T[place], [place]

## 2022-07-01 NOTE — Patient Instructions (Signed)

## 2022-07-01 NOTE — Telephone Encounter (Signed)
  Follow up Call-     06/30/2022    7:42 AM  Call back number  Post procedure Call Back phone  # [phone]  Permission to leave phone message Yes     Patient questions:  Do you have a fever, pain , or abdominal swelling? No. Pain Score  0 *  Have you tolerated food without any problems? Yes.    Have you been able to return to your normal activities? Yes.    Do you have any questions about your discharge instructions: Diet   No. Medications  No. Follow up visit  No.  Do you have questions or concerns about your Care? Yes.    Actions: * If pain score is 4 or above: No action needed, pain <4.

## 2022-07-20 ENCOUNTER — Encounter: Payer: Self-pay | Admitting: Internal Medicine

## 2022-07-22 ENCOUNTER — Ambulatory Visit: Payer: Medicare HMO | Admitting: Podiatry

## 2022-07-31 DIAGNOSIS — H524 Presbyopia: Secondary | ICD-10-CM | POA: Diagnosis not present

## 2022-08-04 ENCOUNTER — Other Ambulatory Visit: Payer: Self-pay | Admitting: Internal Medicine

## 2022-08-04 DIAGNOSIS — Z1231 Encounter for screening mammogram for malignant neoplasm of breast: Secondary | ICD-10-CM

## 2022-08-30 IMAGING — MR MR HEAD WO/W CM
12 series · 48 of 48 positions shown · IV contrast (18ml Multihance)
Comparison: Head CT June 07, 2008

CLINICAL DATA: Falls frequently. Unsteady gait. Essential
hypertension.

EXAM:
MRI HEAD WITHOUT AND WITH CONTRAST
TECHNIQUE: Multiplanar, multiecho pulse sequences of the brain and surrounding
structures were obtained without and with intravenous contrast.
CONTRAST:  10mL MULTIHANCE GADOBENATE DIMEGLUMINE 529 MG/ML IV SOLN

[Series 2: T1 · sagittal · 5.0mm · 0.45mm/px · 2 of 23 slices shown]
[im 1/23]
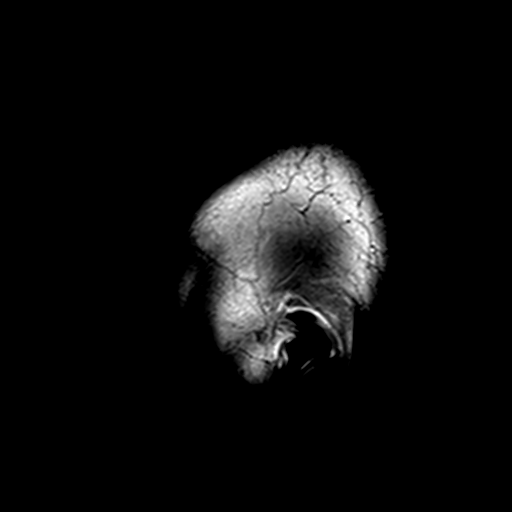
[im 23/23]
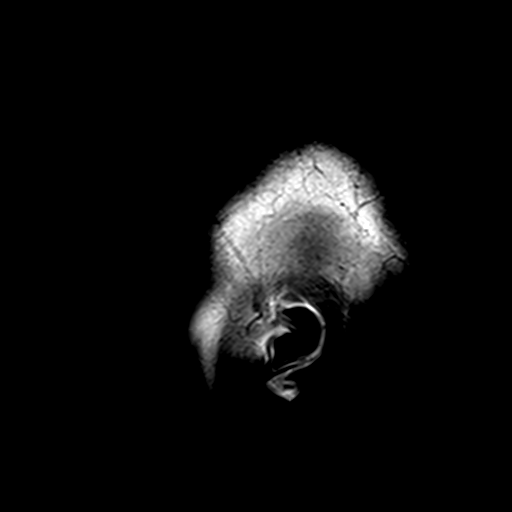

[Series 3: DWI · axial · 3.0mm · 1.80mm/px · z∈[-62,+84]mm · 7 of 98 slices shown (1 of 4)]
[im 1/98]
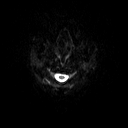
[im 17/98]
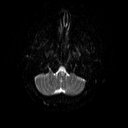
[im 33/98]
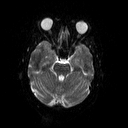
[im 49/98]
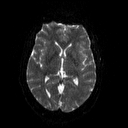
[im 65/98]
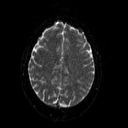
[im 81/98]
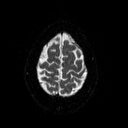
[im 98/98]
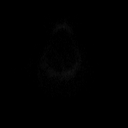

[Series 4: DWI · axial · 3.0mm · 1.80mm/px · z∈[-62,+84]mm · 3 of 48 slices shown (2 of 4)]
[im 1/48]
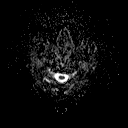
[im 24/48]
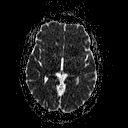
[im 48/48]
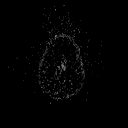

[Series 5: DWI · coronal · 5.0mm · 1.80mm/px · 5 of 70 slices shown (3 of 4)]
[im 1/70]
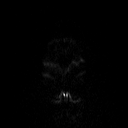
[im 18/70]
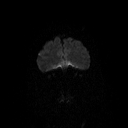
[im 35/70]
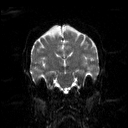
[im 52/70]
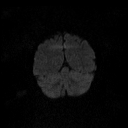
[im 70/70]
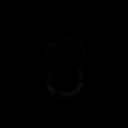

[Series 6: DWI · coronal · 5.0mm · 1.80mm/px · 2 of 36 slices shown (4 of 4)]
[im 1/36]
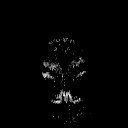
[im 36/36]
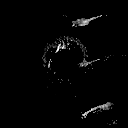

[Series 7: T2 · axial · 5.0mm · 0.90mm/px · 1 of 22 slices shown (1 of 2)]
[im 1/22]
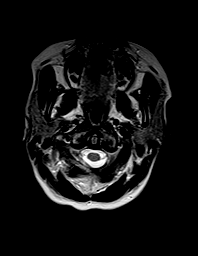

[Series 8: FLAIR · axial · 3.0mm · 0.45mm/px · z∈[-56,+78]mm · 2 of 30 slices shown]
[im 1/30]
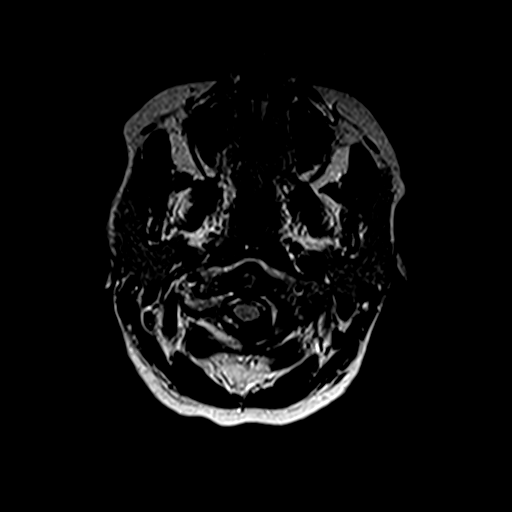
[im 30/30]
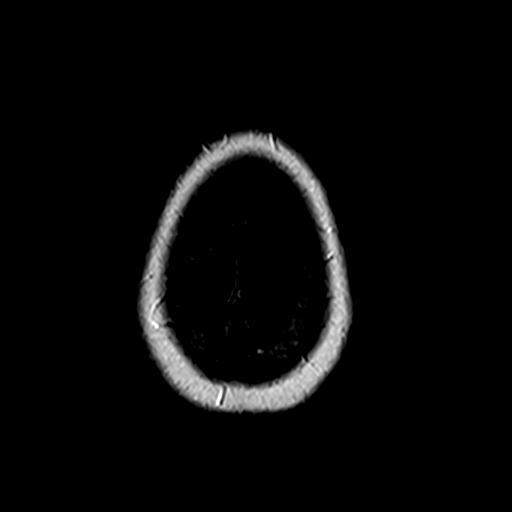

[Series 10: swi_images · axial · 4.0mm · 0.90mm/px · z∈[-59,+81]mm · 2 of 36 slices shown]
[im 1/36]
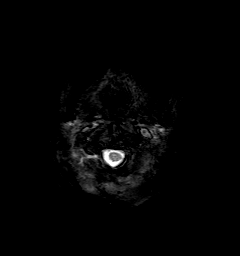
[im 36/36]
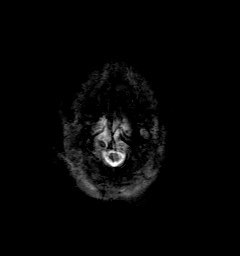

[Series 11: t1_mpr_tra · axial · 1.0mm · 0.75mm/px · z∈[-64,+78]mm · 10 of 144 slices shown]
[im 1/144]
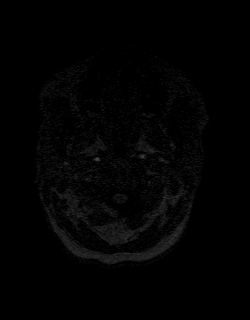
[im 16/144]
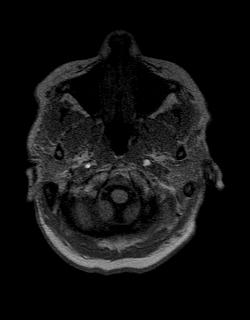
[im 32/144]
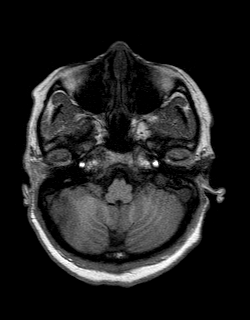
[im 48/144]
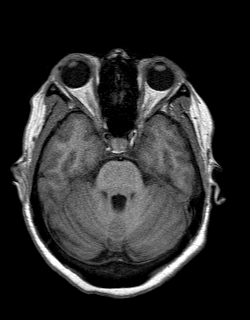
[im 64/144]
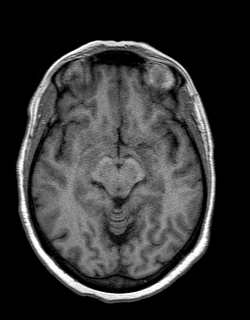
[im 80/144]
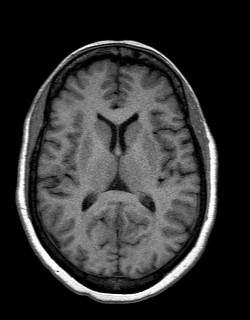
[im 96/144]
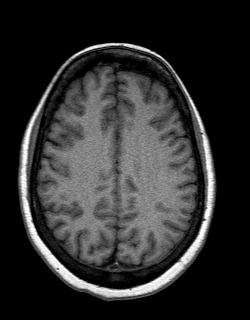
[im 112/144]
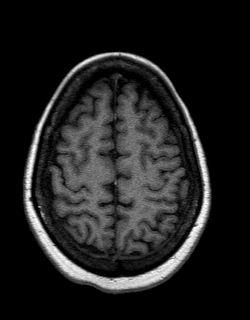
[im 128/144]
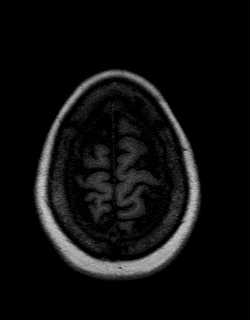
[im 144/144]
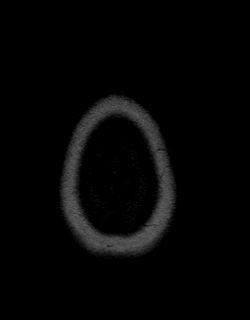

[Series 12: T2 · coronal · 5.0mm · 0.45mm/px · 2 of 28 slices shown (2 of 2)]
[im 1/28]
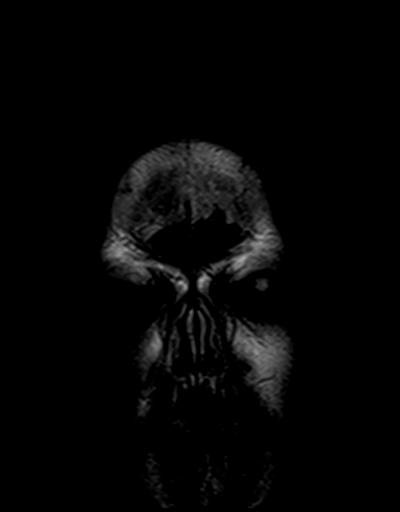
[im 28/28]
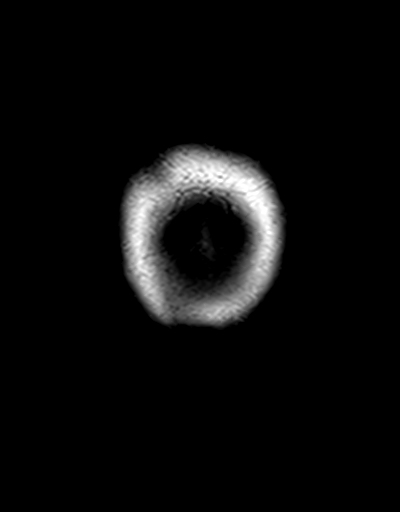

[Series 13: t1_mpr_tra post · axial · 1.0mm · 0.75mm/px · z∈[-64,+78]mm · 10 of 144 slices shown]
[im 1/144]
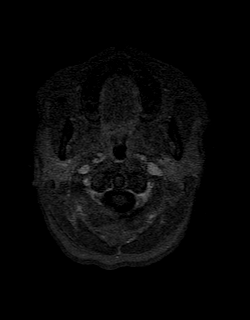
[im 16/144]
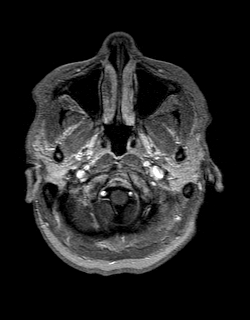
[im 32/144]
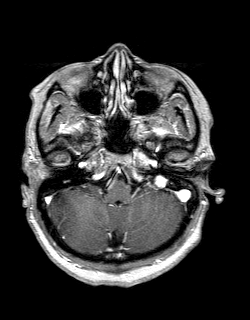
[im 48/144]
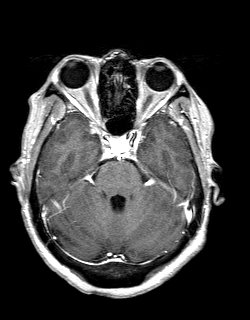
[im 64/144]
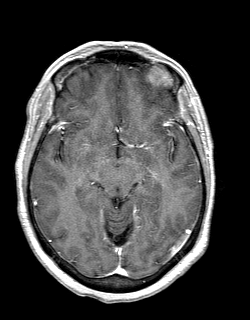
[im 80/144]
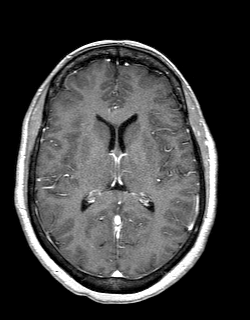
[im 96/144]
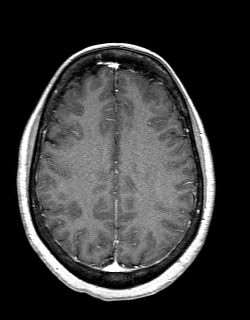
[im 112/144]
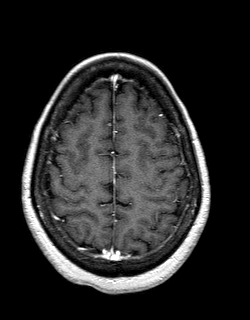
[im 128/144]
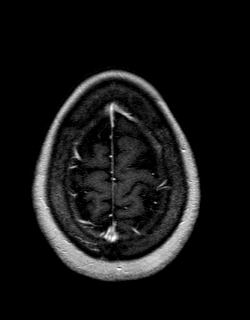
[im 144/144]
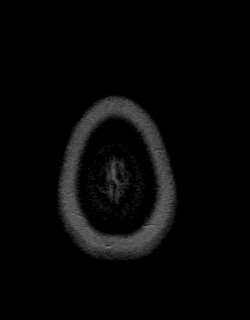

[Series 14: post cor · coronal · 5.0mm · 0.45mm/px · 2 of 28 slices shown]
[im 1/28]
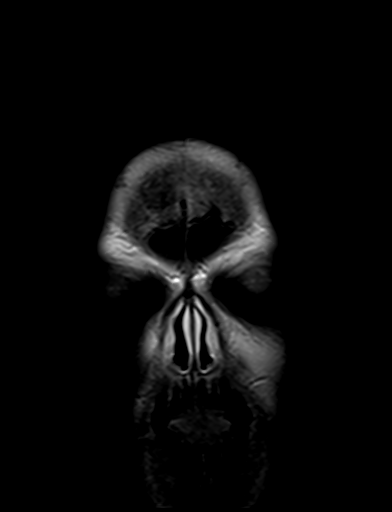
[im 28/28]
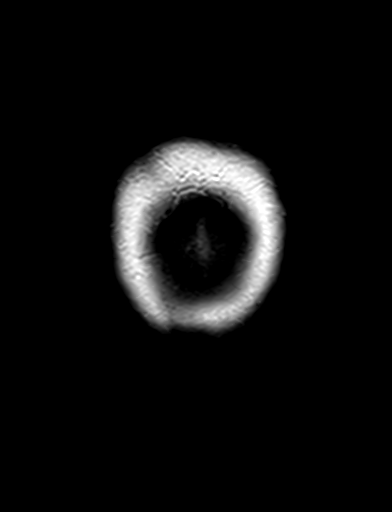

[48 of 48 positions shown; findings below may reference images not displayed]

FINDINGS: Brain: No acute infarction, hemorrhage, hydrocephalus, extra-axial
collection or mass lesion. The brain parenchyma has normal
morphology and signal characteristics. No focus of abnormal contrast
enhancement.

Vascular: Normal flow voids.

Skull and upper cervical spine: Normal marrow signal.

Sinuses/Orbits: Negative.

Other: None.
IMPRESSION: Unremarkable MRI of the brain.

## 2022-09-24 ENCOUNTER — Ambulatory Visit
Admission: RE | Admit: 2022-09-24 | Discharge: 2022-09-24 | Disposition: A | Discharge status: Home / Self Care | Payer: Medicare HMO | Source: Ambulatory Visit | Attending: Internal Medicine | Admitting: Internal Medicine

## 2022-09-24 DIAGNOSIS — Z1231 Encounter for screening mammogram for malignant neoplasm of breast: Secondary | ICD-10-CM | POA: Diagnosis not present

## 2022-11-14 ENCOUNTER — Ambulatory Visit
Admission: EM | Admit: 2022-11-14 | Discharge: 2022-11-14 | Disposition: A | Discharge status: Home / Self Care | Payer: Medicare HMO | Attending: Physician Assistant | Admitting: Physician Assistant

## 2022-11-14 DIAGNOSIS — Z1152 Encounter for screening for COVID-19: Secondary | ICD-10-CM | POA: Diagnosis not present

## 2022-11-14 DIAGNOSIS — J069 Acute upper respiratory infection, unspecified: Secondary | ICD-10-CM

## 2022-11-14 DIAGNOSIS — R059 Cough, unspecified: Secondary | ICD-10-CM | POA: Diagnosis not present

## 2022-11-14 DIAGNOSIS — R0981 Nasal congestion: Secondary | ICD-10-CM | POA: Diagnosis not present

## 2022-11-14 NOTE — Discharge Instructions (Addendum)
Return if any problems.

## 2022-11-14 NOTE — ED Provider Notes (Signed)
[facility]    CSN: [id number] Arrival date & time: 11/14/22  1134      History   Chief Complaint Chief Complaint  Patient presents with   URI    HPI Jennifer [name] is a 62 y.o. female.   Pt complains of a cough and congestion.  Pt reports symptoms started 1 week ago.  Pt reports runny nose.   The history is provided by the patient. No language interpreter was used.  URI Presenting symptoms: congestion and cough   Severity:  Moderate Timing:  Constant Progression:  Worsening Chronicity:  New Relieved by:  Nothing Worsened by:  Nothing Risk factors: no recent illness and no sick contacts     Past Medical History:  Diagnosis Date   Arthritis    arthritis back and knees   Blood transfusion 05/2011   Hx of adenomatous polyp of colon 06/30/2022   Diminutive adenoma- recall 10 years   Hypertension    IBS (irritable bowel syndrome)    Transfusion history    s/p 2012 RTKA ([facility])    Patient Active Problem List   Diagnosis Date Noted   Hx of adenomatous polyp of colon 06/30/2022   Pain in finger of left hand 05/08/2021   Irritable bowel syndrome with diarrhea 11/26/2020   Subcutaneous hematoma 04/24/2020   Stiffness of right knee 04/17/2020   Failed total right knee replacement ([facility]) 04/11/2020   Carpal tunnel syndrome of left wrist 02/08/2020   Lactose intolerance 12/07/2018   Pain in left knee 04/08/2018   Chronic back pain 12/23/2017   OA (osteoarthritis) of knee 08/18/2016    Past Surgical History:  Procedure Laterality Date   ABDOMINAL HYSTERECTOMY  1996   partial   BACK SURGERY  2003/2007   discectomy lower back '03/ '07 rods and artificial disk- Fusion done last time.   CERVICAL FUSION  2006   CHOLECYSTECTOMY  2006   COLONOSCOPY     TOTAL KNEE ARTHROPLASTY  04/2011   Right   TOTAL KNEE ARTHROPLASTY Left 08/18/2016   Procedure: LEFT TOTAL KNEE ARTHROPLASTY;  Surgeon: Gaynelle Arabian, MD;  Location: [facility];  Service: Orthopedics;  Laterality:  Left;   TOTAL KNEE REVISION Right 04/11/2020   Procedure: Right knee polyethylene exchange;  Surgeon: Gaynelle Arabian, MD;  Location: [facility];  Service: Orthopedics;  Laterality: Right;  52mn    OB History   No obstetric history on file.      Home Medications    Prior to Admission medications   Medication Sig Start Date End Date Taking? Authorizing Provider  cholestyramine (QUESTRAN) 4 GM/DOSE powder Take 4 g by mouth daily as needed (stomach pain).    [provider]  dicyclomine (BENTYL) 20 MG tablet Take 1 tablet (20 mg total) by mouth every 6 (six) hours as needed for spasms (diarrhea, abdominal cramps). 10/19/20   GGatha Mayer MD  estradiol (ESTRACE) 1 MG tablet Take 1 mg by mouth daily.     [provider]  fluticasone (FLONASE) 50 MCG/ACT nasal spray Place 1 spray into both nostrils daily as needed for allergies or rhinitis.    [provider]  hydrochlorothiazide (HYDRODIURIL) 25 MG tablet Take 25 mg by mouth daily.    [provider]  loratadine (CLARITIN) 10 MG tablet Take 10 mg by mouth daily as needed for allergies.    [provider]  methylPREDNISolone (MEDROL DOSEPAK) 4 MG TBPK tablet 6 day dose pack - take as directed 07/01/22   [place]  [place], DPM  Multiple Vitamins-Minerals (MULTIVITAMIN WITH MINERALS) tablet Take 1 tablet by mouth daily.    [provider]  traMADol (ULTRAM) 50 MG tablet Take 50 mg by mouth every 6 (six) hours as needed for moderate pain.     [provider]  Zinc Sulfate (ZINC 15 PO) Take 1 tablet by mouth daily.    [provider]    Family History Family History  Problem Relation Age of Onset   Prostate cancer Father    Diabetes Brother    Crohn's disease Brother    Diabetes Brother    Colon cancer Neg Hx    Esophageal cancer Neg Hx    Pancreatic cancer Neg Hx    Stomach cancer Neg Hx    Liver disease Neg Hx    Colon polyps Neg Hx    Rectal cancer Neg Hx     Social  History Social History   Tobacco Use   Smoking status: Never   Smokeless tobacco: Never  Vaping Use   Vaping Use: Never used  Substance Use Topics   Alcohol use: No   Drug use: No     Allergies   Darvocet [propoxyphene n-acetaminophen], Latex, Lodine [etodolac], Methocarbamol, and Oxycodone   Review of Systems Review of Systems  HENT:  Positive for congestion.   Respiratory:  Positive for cough.   All other systems reviewed and are negative.    Physical Exam Triage Vital Signs ED Triage Vitals  Enc Vitals Group     BP 11/14/22 1241 (!) 160/93     Pulse Rate 11/14/22 1241 77     Resp 11/14/22 1241 17     Temp 11/14/22 1241 98.4 F (36.9 C)     Temp Source 11/14/22 1241 Oral     SpO2 11/14/22 1241 93 %     Weight --      Height --      Head Circumference --      Peak Flow --      Pain Score 11/14/22 1243 5     Pain Loc --      Pain Edu? --      Excl. in [place]? --    No data found.  Updated Vital Signs BP (!) 160/93 (BP Location: Left Arm)   Pulse 77   Temp 98.4 F (36.9 C) (Oral)   Resp 17   SpO2 93%   Visual Acuity Right Eye Distance:   Left Eye Distance:   Bilateral Distance:    Right Eye Near:   Left Eye Near:    Bilateral Near:     Physical Exam Vitals and nursing note reviewed.  Constitutional:      Appearance: She is well-developed.  HENT:     Head: Normocephalic.     Mouth/Throat:     Mouth: Mucous membranes are moist.  Eyes:     Pupils: Pupils are equal, round, and reactive to light.  Cardiovascular:     Rate and Rhythm: Normal rate.  Pulmonary:     Effort: Pulmonary effort is normal.  Abdominal:     General: There is no distension.  Musculoskeletal:        General: Normal range of motion.     Cervical back: Normal range of motion.  Neurological:     General: No focal deficit present.     Mental Status: She is alert and oriented to person, place, and time.  Psychiatric:        Mood and Affect: Mood normal.  UC  Treatments / Results  Labs (all labs ordered are listed, but only abnormal results are displayed) Labs Reviewed  SARS CORONAVIRUS 2 (TAT 6-24 HRS)    EKG   Radiology No results found.  Procedures Procedures (including critical care time)  Medications Ordered in UC Medications - No data to display  Initial Impression / Assessment and Plan / UC Course  I have reviewed the triage vital signs and the nursing notes.  Pertinent labs & imaging results that were available during my care of the patient were reviewed by me and considered in my medical decision making (see chart for details).     MDM:  covid ordered,  I suspect viral illness  Final Clinical Impressions(s) / UC Diagnoses   Final diagnoses:  Acute upper respiratory infection     Discharge Instructions      Return if any problems.    ED Prescriptions   None    PDMP not reviewed this encounter. An After Visit Summary was printed and given to the patient.       Fransico Meadow, [place] 11/14/22 1314

## 2022-11-14 NOTE — ED Triage Notes (Signed)
Pt presents with sinus pain, nasal congestion, scratchy throat, and non productive cough x 1 week.

## 2022-11-15 LAB — SARS CORONAVIRUS 2 (TAT 6-24 HRS): SARS Coronavirus 2: NEGATIVE

## 2022-12-01 DIAGNOSIS — M542 Cervicalgia: Secondary | ICD-10-CM | POA: Diagnosis not present

## 2022-12-01 DIAGNOSIS — M47812 Spondylosis without myelopathy or radiculopathy, cervical region: Secondary | ICD-10-CM | POA: Diagnosis not present

## 2022-12-01 DIAGNOSIS — Z79891 Long term (current) use of opiate analgesic: Secondary | ICD-10-CM | POA: Diagnosis not present

## 2022-12-01 DIAGNOSIS — Z79899 Other long term (current) drug therapy: Secondary | ICD-10-CM | POA: Diagnosis not present

## 2022-12-01 DIAGNOSIS — M549 Dorsalgia, unspecified: Secondary | ICD-10-CM | POA: Diagnosis not present

## 2022-12-01 DIAGNOSIS — M47816 Spondylosis without myelopathy or radiculopathy, lumbar region: Secondary | ICD-10-CM | POA: Diagnosis not present

## 2022-12-01 DIAGNOSIS — G894 Chronic pain syndrome: Secondary | ICD-10-CM | POA: Diagnosis not present

## 2022-12-01 DIAGNOSIS — M5106 Intervertebral disc disorders with myelopathy, lumbar region: Secondary | ICD-10-CM | POA: Diagnosis not present

## 2022-12-17 ENCOUNTER — Other Ambulatory Visit: Payer: Self-pay | Admitting: Internal Medicine

## 2022-12-17 DIAGNOSIS — Z79899 Other long term (current) drug therapy: Secondary | ICD-10-CM | POA: Diagnosis not present

## 2022-12-17 DIAGNOSIS — E041 Nontoxic single thyroid nodule: Secondary | ICD-10-CM

## 2022-12-17 DIAGNOSIS — Z124 Encounter for screening for malignant neoplasm of cervix: Secondary | ICD-10-CM | POA: Diagnosis not present

## 2022-12-17 DIAGNOSIS — R1310 Dysphagia, unspecified: Secondary | ICD-10-CM

## 2022-12-17 DIAGNOSIS — Z6833 Body mass index (BMI) 33.0-33.9, adult: Secondary | ICD-10-CM | POA: Diagnosis not present

## 2022-12-17 DIAGNOSIS — Z0000 Encounter for general adult medical examination without abnormal findings: Secondary | ICD-10-CM | POA: Diagnosis not present

## 2022-12-17 DIAGNOSIS — I10 Essential (primary) hypertension: Secondary | ICD-10-CM | POA: Diagnosis not present

## 2022-12-26 DIAGNOSIS — M1990 Unspecified osteoarthritis, unspecified site: Secondary | ICD-10-CM | POA: Diagnosis not present

## 2022-12-26 DIAGNOSIS — R2681 Unsteadiness on feet: Secondary | ICD-10-CM | POA: Diagnosis not present

## 2022-12-26 DIAGNOSIS — I10 Essential (primary) hypertension: Secondary | ICD-10-CM | POA: Diagnosis not present

## 2022-12-26 DIAGNOSIS — Z791 Long term (current) use of non-steroidal anti-inflammatories (NSAID): Secondary | ICD-10-CM | POA: Diagnosis not present

## 2022-12-26 DIAGNOSIS — Z6832 Body mass index (BMI) 32.0-32.9, adult: Secondary | ICD-10-CM | POA: Diagnosis not present

## 2022-12-26 DIAGNOSIS — Z809 Family history of malignant neoplasm, unspecified: Secondary | ICD-10-CM | POA: Diagnosis not present

## 2022-12-26 DIAGNOSIS — E669 Obesity, unspecified: Secondary | ICD-10-CM | POA: Diagnosis not present

## 2022-12-26 DIAGNOSIS — R197 Diarrhea, unspecified: Secondary | ICD-10-CM | POA: Diagnosis not present

## 2022-12-26 DIAGNOSIS — N951 Menopausal and female climacteric states: Secondary | ICD-10-CM | POA: Diagnosis not present

## 2022-12-26 DIAGNOSIS — M4800 Spinal stenosis, site unspecified: Secondary | ICD-10-CM | POA: Diagnosis not present

## 2022-12-26 DIAGNOSIS — Z79890 Hormone replacement therapy (postmenopausal): Secondary | ICD-10-CM | POA: Diagnosis not present

## 2022-12-26 DIAGNOSIS — R32 Unspecified urinary incontinence: Secondary | ICD-10-CM | POA: Diagnosis not present

## 2022-12-29 ENCOUNTER — Ambulatory Visit
Admission: RE | Admit: 2022-12-29 | Discharge: 2022-12-29 | Disposition: A | Discharge status: Home / Self Care | Payer: Medicare HMO | Source: Ambulatory Visit | Attending: Internal Medicine | Admitting: Internal Medicine

## 2022-12-29 DIAGNOSIS — K219 Gastro-esophageal reflux disease without esophagitis: Secondary | ICD-10-CM | POA: Diagnosis not present

## 2022-12-29 DIAGNOSIS — K224 Dyskinesia of esophagus: Secondary | ICD-10-CM | POA: Diagnosis not present

## 2022-12-29 DIAGNOSIS — R1310 Dysphagia, unspecified: Secondary | ICD-10-CM | POA: Diagnosis not present

## 2023-01-12 ENCOUNTER — Ambulatory Visit
Admission: RE | Admit: 2023-01-12 | Discharge: 2023-01-12 | Disposition: A | Discharge status: Home / Self Care | Payer: Medicare HMO | Source: Ambulatory Visit | Attending: Internal Medicine | Admitting: Internal Medicine

## 2023-01-12 DIAGNOSIS — E041 Nontoxic single thyroid nodule: Secondary | ICD-10-CM

## 2023-01-12 DIAGNOSIS — E042 Nontoxic multinodular goiter: Secondary | ICD-10-CM | POA: Diagnosis not present

## 2023-01-20 ENCOUNTER — Ambulatory Visit: Payer: Medicare HMO | Admitting: Podiatry

## 2023-01-20 ENCOUNTER — Ambulatory Visit: Payer: Medicare HMO

## 2023-01-20 ENCOUNTER — Encounter: Payer: Self-pay | Admitting: Podiatry

## 2023-01-20 DIAGNOSIS — M7662 Achilles tendinitis, left leg: Secondary | ICD-10-CM

## 2023-01-20 DIAGNOSIS — M722 Plantar fascial fibromatosis: Secondary | ICD-10-CM | POA: Diagnosis not present

## 2023-01-20 MED ORDER — DEXAMETHASONE SODIUM PHOSPHATE 120 MG/30ML IJ SOLN
2.0000 mg | Freq: Once | INTRAMUSCULAR | Status: AC
Start: 2023-01-20 — End: 2023-01-20
  Administered 2023-01-20: 2 mg via INTRA_ARTICULAR

## 2023-01-20 MED ORDER — TRIAMCINOLONE ACETONIDE 40 MG/ML IJ SUSP
20.0000 mg | Freq: Once | INTRAMUSCULAR | Status: AC
Start: 2023-01-20 — End: 2023-01-20
  Administered 2023-01-20: 20 mg

## 2023-01-20 NOTE — Progress Notes (Signed)
He presents today for follow-up of her Planter fasciitis bilateral.  Second left and still bothersome in the arch and in the posterior heel.  Objective: Vital signs have tellurian x 3 and severe pain to palpation of the posterior left there is fluctuance and appears to be bursitis.  She also has tenderness on the plantar fascial Caney insertion site of the left heel.  Assessment: Bursitis and Achilles tendinitis left.  Plan fasciitis resolving left.  Plan: Injected the bursitis today with 2 mg dexamethasone local anesthetic and 1 muscle tenderness injected the plantar fascia with 20 mg of Kenalog and Marcaine.  Discussed offloading the heel with a pillow while in bed.  Follow-up with her in the near future.

## 2023-03-17 DIAGNOSIS — M5416 Radiculopathy, lumbar region: Secondary | ICD-10-CM | POA: Diagnosis not present

## 2023-03-17 DIAGNOSIS — M47896 Other spondylosis, lumbar region: Secondary | ICD-10-CM | POA: Diagnosis not present

## 2023-03-17 DIAGNOSIS — M7062 Trochanteric bursitis, left hip: Secondary | ICD-10-CM | POA: Diagnosis not present

## 2023-03-17 DIAGNOSIS — M4326 Fusion of spine, lumbar region: Secondary | ICD-10-CM | POA: Diagnosis not present

## 2023-03-20 DIAGNOSIS — M7062 Trochanteric bursitis, left hip: Secondary | ICD-10-CM | POA: Diagnosis not present

## 2023-04-08 ENCOUNTER — Telehealth: Payer: Self-pay

## 2023-04-08 NOTE — Patient Outreach (Signed)
  Care Coordination   Initial Visit Note   04/08/2023 Name: TOMEEKA PLAUGHER MRN: [id number] DOB: [birth year]  Brand Males Hagwood is a 62 y.o. year old female who sees Prochnau, Rayfield Citizen, MD for primary care. I spoke with  Sowmya L Wirt by phone today.  What matters to the patients health and wellness today?  Placed call to patient to review and offer [facility] care coordination program.  Patient reports that she is doing well and denies any needs at this time.     SDOH assessments and interventions completed:  No     Care Coordination Interventions:  No, not indicated   Follow up plan: No further intervention required.   Encounter Outcome:  Pt. Refused   Rowe Pavy, RN, BSN, CEN [facility] [phone]

## 2023-04-21 DIAGNOSIS — M7062 Trochanteric bursitis, left hip: Secondary | ICD-10-CM | POA: Diagnosis not present

## 2023-08-06 DIAGNOSIS — H524 Presbyopia: Secondary | ICD-10-CM | POA: Diagnosis not present

## 2023-08-06 DIAGNOSIS — H52221 Regular astigmatism, right eye: Secondary | ICD-10-CM | POA: Diagnosis not present

## 2023-08-10 DIAGNOSIS — E669 Obesity, unspecified: Secondary | ICD-10-CM | POA: Diagnosis not present

## 2023-08-10 DIAGNOSIS — N951 Menopausal and female climacteric states: Secondary | ICD-10-CM | POA: Diagnosis not present

## 2023-08-10 DIAGNOSIS — Z6834 Body mass index (BMI) 34.0-34.9, adult: Secondary | ICD-10-CM | POA: Diagnosis not present

## 2023-08-10 DIAGNOSIS — I10 Essential (primary) hypertension: Secondary | ICD-10-CM | POA: Diagnosis not present

## 2023-08-10 DIAGNOSIS — K219 Gastro-esophageal reflux disease without esophagitis: Secondary | ICD-10-CM | POA: Diagnosis not present

## 2023-08-12 ENCOUNTER — Other Ambulatory Visit: Payer: Self-pay | Admitting: Internal Medicine

## 2023-08-12 DIAGNOSIS — Z0000 Encounter for general adult medical examination without abnormal findings: Secondary | ICD-10-CM

## 2023-09-15 ENCOUNTER — Encounter: Payer: Self-pay | Admitting: Podiatry

## 2023-09-15 ENCOUNTER — Ambulatory Visit: Payer: Medicare HMO | Admitting: Podiatry

## 2023-09-15 DIAGNOSIS — S86012A Strain of left Achilles tendon, initial encounter: Secondary | ICD-10-CM

## 2023-09-15 DIAGNOSIS — S93692A Other sprain of left foot, initial encounter: Secondary | ICD-10-CM

## 2023-09-15 DIAGNOSIS — M66869 Spontaneous rupture of other tendons, unspecified lower leg: Secondary | ICD-10-CM | POA: Diagnosis not present

## 2023-09-16 NOTE — Progress Notes (Signed)
She presents today for follow-up of her tibialis anterior plantar fasciitis pain to her left foot.  She states that she has done everything that we have asked her to she is also tried staying off of this is much as possible as she says it just seems to be getting worse rather than better.  Objective: Vital signs are stable she is alert and oriented x 3.  Severe pain on palpation medial calcaneal tubercle of the left heel as well as the tibialis anterior tendon insertion left midfoot.  The area is swollen and warm to the touch.  Assessment: Worsening of her plantar fascia most likely a plantar fascial tear tibialis anterior tendon compensation most likely resulting in a small insertional tear.  Plan: Discussed etiology pathology conservative surgical therapies at this point conservative therapies have failed to render her asymptomatic including injections steroidals nonsteroidals immobilization and physical therapies.  At this point MRI is necessary to confirm diagnosis as well as possible differential diagnosis and surgical planning.

## 2023-09-28 ENCOUNTER — Ambulatory Visit
Admission: RE | Admit: 2023-09-28 | Discharge: 2023-09-28 | Disposition: A | Discharge status: Home / Self Care | Payer: Medicare HMO | Source: Ambulatory Visit | Attending: Internal Medicine | Admitting: Internal Medicine

## 2023-09-28 DIAGNOSIS — Z0000 Encounter for general adult medical examination without abnormal findings: Secondary | ICD-10-CM

## 2023-09-28 DIAGNOSIS — Z1231 Encounter for screening mammogram for malignant neoplasm of breast: Secondary | ICD-10-CM | POA: Diagnosis not present

## 2023-10-04 ENCOUNTER — Ambulatory Visit
Admission: RE | Admit: 2023-10-04 | Discharge: 2023-10-04 | Disposition: A | Discharge status: Home / Self Care | Payer: Medicare HMO | Source: Ambulatory Visit | Attending: Podiatry | Admitting: Podiatry

## 2023-10-04 DIAGNOSIS — S93692A Other sprain of left foot, initial encounter: Secondary | ICD-10-CM | POA: Diagnosis not present

## 2023-10-04 DIAGNOSIS — M66869 Spontaneous rupture of other tendons, unspecified lower leg: Secondary | ICD-10-CM

## 2023-10-04 DIAGNOSIS — S86012A Strain of left Achilles tendon, initial encounter: Secondary | ICD-10-CM

## 2023-10-26 DIAGNOSIS — N898 Other specified noninflammatory disorders of vagina: Secondary | ICD-10-CM | POA: Diagnosis not present

## 2023-10-26 DIAGNOSIS — Z6834 Body mass index (BMI) 34.0-34.9, adult: Secondary | ICD-10-CM | POA: Diagnosis not present

## 2023-10-26 DIAGNOSIS — R0981 Nasal congestion: Secondary | ICD-10-CM | POA: Diagnosis not present

## 2023-10-28 ENCOUNTER — Telehealth: Payer: Self-pay | Admitting: Podiatry

## 2023-10-28 NOTE — Telephone Encounter (Signed)
 Left message for pt to call to schedule an appt with Dr Lara Plants.

## 2023-10-28 NOTE — Telephone Encounter (Signed)
-----   Message from Pheobe Brass sent at 10/23/2023  9:02 AM EST ----- Schedule with Dr. Lara Plants for when he returns... thanks!! ----- Message ----- From: Interface, Rad Results In Sent: 10/20/2023   3:35 PM EST To: Clemetine Cypher, DPM

## 2023-11-03 ENCOUNTER — Ambulatory Visit
Admission: EM | Admit: 2023-11-03 | Discharge: 2023-11-03 | Disposition: A | Discharge status: Home / Self Care | Payer: Medicare HMO | Attending: Family Medicine | Admitting: Family Medicine

## 2023-11-03 ENCOUNTER — Encounter: Payer: Self-pay | Admitting: Emergency Medicine

## 2023-11-03 ENCOUNTER — Other Ambulatory Visit: Payer: Self-pay

## 2023-11-03 DIAGNOSIS — J111 Influenza due to unidentified influenza virus with other respiratory manifestations: Secondary | ICD-10-CM

## 2023-11-03 LAB — POC COVID19/FLU A&B COMBO
Covid Antigen, POC: NEGATIVE
Influenza A Antigen, POC: NEGATIVE
Influenza B Antigen, POC: NEGATIVE

## 2023-11-03 MED ORDER — HYDROCODONE BIT-HOMATROP MBR 5-1.5 MG/5ML PO SOLN: 5.0000 mL | Freq: Four times a day (QID) | ORAL | 0 refills | Status: DC | PRN

## 2023-11-03 NOTE — ED Triage Notes (Signed)
Pt here for cough and congestion x 2 days 

## 2023-11-04 NOTE — ED Provider Notes (Signed)
[facility]   [id number] 11/03/23 Arrival Time: 1641  ASSESSMENT & PLAN:  1. Influenza-like illness     Discussed typical duration of likely viral illness. Results for orders placed or performed during the hospital encounter of 11/03/23  POC Covid19/Flu A&B Antigen   Collection Time: 11/03/23  7:04 PM  Result Value Ref Range   Influenza A Antigen, POC Negative    Influenza B Antigen, POC Negative    Covid Antigen, POC Negative    OTC symptom care as needed.  Discharge Medication List as of 11/03/2023  7:12 PM     START taking these medications   Details  HYDROcodone bit-homatropine (HYCODAN) 5-1.5 MG/5ML syrup Take 5 mLs by mouth every 6 (six) hours as needed for cough., Starting Tue 11/03/2023, Normal           Follow-up Information     Philemon Kingdom, MD.   Specialty: Internal Medicine Why: As needed. Contact information: [address] [place] [address] [phone]                 Reviewed expectations re: course of current medical issues. Questions answered. Outlined signs and symptoms indicating need for more acute intervention. Understanding verbalized. After Visit Summary given.   SUBJECTIVE: History from: Patient. Jennifer [name] is a 63 y.o. female. Reports: cough, nasal congestion; abrupt onset; x 2 days. Denies: fever and difficulty breathing. Normal PO intake without n/v/d.  OBJECTIVE:  Vitals:   11/03/23 1820  BP: (!) 142/86  Pulse: 92  Resp: 18  Temp: 99.6 F (37.6 C)  TempSrc: Oral  SpO2: 97%    General appearance: alert; no distress Eyes: PERRLA; EOMI; conjunctiva normal HENT: [place]; AT; with nasal congestion Neck: supple  Lungs: speaks full sentences without difficulty; unlabored; ctab Extremities: no edema Skin: warm and dry Neurologic: normal gait Psychological: alert and cooperative; normal mood and affect  Labs: Results for orders placed or performed during the hospital encounter of 11/03/23  POC  Covid19/Flu A&B Antigen   Collection Time: 11/03/23  7:04 PM  Result Value Ref Range   Influenza A Antigen, POC Negative    Influenza B Antigen, POC Negative    Covid Antigen, POC Negative    Labs Reviewed  POC COVID19/FLU A&B COMBO - Normal    Imaging: No results found.  Allergies  Allergen Reactions   Darvocet [Propoxyphene N-Acetaminophen] Swelling   Latex Hives    "contact only'   Lodine [Etodolac] Swelling   Methocarbamol Swelling   Oxycodone     Other reaction(s): Not available    Past Medical History:  Diagnosis Date   Arthritis    arthritis back and knees   Blood transfusion 05/2011   Hx of adenomatous polyp of colon 06/30/2022   Diminutive adenoma- recall 10 years   Hypertension    IBS (irritable bowel syndrome)    Transfusion history    s/p 2012 RTKA ([facility])   Social History   Socioeconomic History   Marital status: Widowed    Spouse name: Not on file   Number of children: 3   Years of education: Not on file   Highest education level: Not on file  Occupational History    Employer: DISABLED   Tobacco Use   Smoking status: Never   Smokeless tobacco: Never  Vaping Use   Vaping status: Never Used  Substance and Sexual Activity   Alcohol use: No   Drug use: No   Sexual activity: Not Currently  Other Topics Concern  Not on file  Social History Narrative   She is widowed and she has 3 daughters as grandchildren also   Disabled   Never smoker no other tobacco   No alcohol, no illicit drug use   1 caffeinated beverage daily   Social Drivers of Corporate investment banker Strain: Not on file  Food Insecurity: Not on file  Transportation Needs: Not on file  Physical Activity: Not on file  Stress: Not on file  Social Connections: Not on file  Intimate Partner Violence: Not on file   Family History  Problem Relation Age of Onset   Prostate cancer Father    Diabetes Brother    Crohn's disease Brother    Diabetes Brother    Colon cancer Neg Hx     Esophageal cancer Neg Hx    Pancreatic cancer Neg Hx    Stomach cancer Neg Hx    Liver disease Neg Hx    Colon polyps Neg Hx    Rectal cancer Neg Hx    Past Surgical History:  Procedure Laterality Date   ABDOMINAL HYSTERECTOMY  1996   partial   BACK SURGERY  2003/2007   discectomy lower back '03/ '07 rods and artificial disk- Fusion done last time.   CERVICAL FUSION  2006   CHOLECYSTECTOMY  2006   COLONOSCOPY     TOTAL KNEE ARTHROPLASTY  04/2011   Right   TOTAL KNEE ARTHROPLASTY Left 08/18/2016   Procedure: LEFT TOTAL KNEE ARTHROPLASTY;  Surgeon: Ollen Gross, MD;  Location: [facility];  Service: Orthopedics;  Laterality: Left;   TOTAL KNEE REVISION Right 04/11/2020   Procedure: Right knee polyethylene exchange;  Surgeon: Ollen Gross, MD;  Location: [facility];  Service: Orthopedics;  Laterality: Right;      Mardella Layman, MD 11/04/23 [phone]

## 2023-11-19 ENCOUNTER — Ambulatory Visit: Payer: Medicare HMO | Admitting: Podiatry

## 2023-11-19 ENCOUNTER — Other Ambulatory Visit: Payer: Self-pay

## 2023-11-19 ENCOUNTER — Telehealth: Payer: Self-pay

## 2023-11-19 DIAGNOSIS — M722 Plantar fascial fibromatosis: Secondary | ICD-10-CM

## 2023-11-19 DIAGNOSIS — M66869 Spontaneous rupture of other tendons, unspecified lower leg: Secondary | ICD-10-CM

## 2023-11-19 DIAGNOSIS — M7662 Achilles tendinitis, left leg: Secondary | ICD-10-CM

## 2023-11-19 NOTE — Telephone Encounter (Signed)
 Spoke to patient -she is willing to try PT. Can cancel appointment for today. She will call back with the location of PT close to her work. Advised we will send the referral when we have the location.

## 2023-11-19 NOTE — Telephone Encounter (Signed)
-----   Message from Clemetine Cypher sent at 11/19/2023  7:09 AM EST ----- Her MRI demonstrates osteoarthritis and achilles tendinitis.  She could try PT first before coming in.  Orthotics would be the only other option.  She is scheduled for this afternoon.

## 2023-11-30 DIAGNOSIS — J019 Acute sinusitis, unspecified: Secondary | ICD-10-CM | POA: Diagnosis not present

## 2023-12-14 DIAGNOSIS — M25572 Pain in left ankle and joints of left foot: Secondary | ICD-10-CM | POA: Diagnosis not present

## 2023-12-14 DIAGNOSIS — M25672 Stiffness of left ankle, not elsewhere classified: Secondary | ICD-10-CM | POA: Diagnosis not present

## 2023-12-15 DIAGNOSIS — M25572 Pain in left ankle and joints of left foot: Secondary | ICD-10-CM | POA: Insufficient documentation

## 2023-12-15 DIAGNOSIS — M25672 Stiffness of left ankle, not elsewhere classified: Secondary | ICD-10-CM | POA: Insufficient documentation

## 2023-12-17 DIAGNOSIS — M25572 Pain in left ankle and joints of left foot: Secondary | ICD-10-CM | POA: Diagnosis not present

## 2023-12-17 DIAGNOSIS — M25672 Stiffness of left ankle, not elsewhere classified: Secondary | ICD-10-CM | POA: Diagnosis not present

## 2023-12-19 DIAGNOSIS — Z833 Family history of diabetes mellitus: Secondary | ICD-10-CM | POA: Diagnosis not present

## 2023-12-19 DIAGNOSIS — J302 Other seasonal allergic rhinitis: Secondary | ICD-10-CM | POA: Diagnosis not present

## 2023-12-19 DIAGNOSIS — Z885 Allergy status to narcotic agent status: Secondary | ICD-10-CM | POA: Diagnosis not present

## 2023-12-19 DIAGNOSIS — K219 Gastro-esophageal reflux disease without esophagitis: Secondary | ICD-10-CM | POA: Diagnosis not present

## 2023-12-19 DIAGNOSIS — R2681 Unsteadiness on feet: Secondary | ICD-10-CM | POA: Diagnosis not present

## 2023-12-19 DIAGNOSIS — Z6833 Body mass index (BMI) 33.0-33.9, adult: Secondary | ICD-10-CM | POA: Diagnosis not present

## 2023-12-19 DIAGNOSIS — R32 Unspecified urinary incontinence: Secondary | ICD-10-CM | POA: Diagnosis not present

## 2023-12-19 DIAGNOSIS — Z888 Allergy status to other drugs, medicaments and biological substances status: Secondary | ICD-10-CM | POA: Diagnosis not present

## 2023-12-19 DIAGNOSIS — M1990 Unspecified osteoarthritis, unspecified site: Secondary | ICD-10-CM | POA: Diagnosis not present

## 2023-12-19 DIAGNOSIS — I10 Essential (primary) hypertension: Secondary | ICD-10-CM | POA: Diagnosis not present

## 2023-12-19 DIAGNOSIS — E669 Obesity, unspecified: Secondary | ICD-10-CM | POA: Diagnosis not present

## 2023-12-19 DIAGNOSIS — Z809 Family history of malignant neoplasm, unspecified: Secondary | ICD-10-CM | POA: Diagnosis not present

## 2023-12-23 DIAGNOSIS — M25672 Stiffness of left ankle, not elsewhere classified: Secondary | ICD-10-CM | POA: Diagnosis not present

## 2023-12-23 DIAGNOSIS — M25572 Pain in left ankle and joints of left foot: Secondary | ICD-10-CM | POA: Diagnosis not present

## 2023-12-24 DIAGNOSIS — J0191 Acute recurrent sinusitis, unspecified: Secondary | ICD-10-CM | POA: Diagnosis not present

## 2023-12-24 DIAGNOSIS — Z0000 Encounter for general adult medical examination without abnormal findings: Secondary | ICD-10-CM | POA: Diagnosis not present

## 2023-12-24 DIAGNOSIS — I10 Essential (primary) hypertension: Secondary | ICD-10-CM | POA: Diagnosis not present

## 2023-12-24 DIAGNOSIS — E041 Nontoxic single thyroid nodule: Secondary | ICD-10-CM | POA: Diagnosis not present

## 2023-12-24 DIAGNOSIS — Z1331 Encounter for screening for depression: Secondary | ICD-10-CM | POA: Diagnosis not present

## 2023-12-24 DIAGNOSIS — Z79899 Other long term (current) drug therapy: Secondary | ICD-10-CM | POA: Diagnosis not present

## 2023-12-24 DIAGNOSIS — Z6834 Body mass index (BMI) 34.0-34.9, adult: Secondary | ICD-10-CM | POA: Diagnosis not present

## 2023-12-24 DIAGNOSIS — K219 Gastro-esophageal reflux disease without esophagitis: Secondary | ICD-10-CM | POA: Diagnosis not present

## 2023-12-25 DIAGNOSIS — M25572 Pain in left ankle and joints of left foot: Secondary | ICD-10-CM | POA: Diagnosis not present

## 2023-12-28 ENCOUNTER — Other Ambulatory Visit: Payer: Self-pay | Admitting: Internal Medicine

## 2023-12-28 DIAGNOSIS — E041 Nontoxic single thyroid nodule: Secondary | ICD-10-CM

## 2023-12-30 DIAGNOSIS — M25572 Pain in left ankle and joints of left foot: Secondary | ICD-10-CM | POA: Diagnosis not present

## 2024-01-01 ENCOUNTER — Ambulatory Visit
Admission: RE | Admit: 2024-01-01 | Discharge: 2024-01-01 | Disposition: A | Discharge status: Home / Self Care | Source: Ambulatory Visit | Attending: Internal Medicine | Admitting: Internal Medicine

## 2024-01-01 DIAGNOSIS — E041 Nontoxic single thyroid nodule: Secondary | ICD-10-CM

## 2024-01-01 DIAGNOSIS — E042 Nontoxic multinodular goiter: Secondary | ICD-10-CM | POA: Diagnosis not present

## 2024-01-01 DIAGNOSIS — M25572 Pain in left ankle and joints of left foot: Secondary | ICD-10-CM | POA: Diagnosis not present

## 2024-01-07 ENCOUNTER — Other Ambulatory Visit: Payer: Self-pay | Admitting: Internal Medicine

## 2024-01-07 DIAGNOSIS — E041 Nontoxic single thyroid nodule: Secondary | ICD-10-CM

## 2024-01-07 DIAGNOSIS — M25572 Pain in left ankle and joints of left foot: Secondary | ICD-10-CM | POA: Diagnosis not present

## 2024-01-15 ENCOUNTER — Other Ambulatory Visit (HOSPITAL_COMMUNITY)
Admission: RE | Admit: 2024-01-15 | Discharge: 2024-01-15 | Disposition: A | Discharge status: Home / Self Care | Source: Ambulatory Visit | Attending: Student | Admitting: Student

## 2024-01-15 ENCOUNTER — Ambulatory Visit
Admission: RE | Admit: 2024-01-15 | Discharge: 2024-01-15 | Disposition: A | Discharge status: Home / Self Care | Source: Ambulatory Visit | Attending: Internal Medicine | Admitting: Internal Medicine

## 2024-01-15 DIAGNOSIS — E041 Nontoxic single thyroid nodule: Secondary | ICD-10-CM

## 2024-01-19 LAB — CYTOLOGY - NON PAP

## 2024-04-22 DIAGNOSIS — R1084 Generalized abdominal pain: Secondary | ICD-10-CM | POA: Diagnosis not present

## 2024-04-22 DIAGNOSIS — R109 Unspecified abdominal pain: Secondary | ICD-10-CM | POA: Diagnosis not present

## 2024-05-10 DIAGNOSIS — R109 Unspecified abdominal pain: Secondary | ICD-10-CM | POA: Insufficient documentation

## 2024-05-11 DIAGNOSIS — R109 Unspecified abdominal pain: Secondary | ICD-10-CM | POA: Diagnosis not present

## 2024-05-11 DIAGNOSIS — R1032 Left lower quadrant pain: Secondary | ICD-10-CM | POA: Diagnosis not present

## 2024-05-11 DIAGNOSIS — Z79890 Hormone replacement therapy (postmenopausal): Secondary | ICD-10-CM | POA: Diagnosis not present

## 2024-05-11 DIAGNOSIS — Z471 Aftercare following joint replacement surgery: Secondary | ICD-10-CM | POA: Diagnosis not present

## 2024-05-11 DIAGNOSIS — Z96653 Presence of artificial knee joint, bilateral: Secondary | ICD-10-CM | POA: Diagnosis not present

## 2024-05-28 ENCOUNTER — Ambulatory Visit: Admission: EM | Admit: 2024-05-28 | Discharge: 2024-05-28 | Disposition: A | Discharge status: Home / Self Care

## 2024-05-28 DIAGNOSIS — U071 COVID-19: Secondary | ICD-10-CM | POA: Diagnosis not present

## 2024-05-28 DIAGNOSIS — N898 Other specified noninflammatory disorders of vagina: Secondary | ICD-10-CM | POA: Insufficient documentation

## 2024-05-28 DIAGNOSIS — K5730 Diverticulosis of large intestine without perforation or abscess without bleeding: Secondary | ICD-10-CM | POA: Insufficient documentation

## 2024-05-28 DIAGNOSIS — N952 Postmenopausal atrophic vaginitis: Secondary | ICD-10-CM | POA: Insufficient documentation

## 2024-05-28 DIAGNOSIS — Z9049 Acquired absence of other specified parts of digestive tract: Secondary | ICD-10-CM | POA: Insufficient documentation

## 2024-05-28 DIAGNOSIS — I10 Essential (primary) hypertension: Secondary | ICD-10-CM | POA: Insufficient documentation

## 2024-05-28 LAB — POC COVID19/FLU A&B COMBO
Covid Antigen, POC: POSITIVE — AB
Influenza A Antigen, POC: NEGATIVE
Influenza B Antigen, POC: NEGATIVE

## 2024-05-28 LAB — POCT RAPID STREP A (OFFICE): Rapid Strep A Screen: NEGATIVE

## 2024-05-28 MED ORDER — AZELASTINE HCL 0.1 % NA SOLN
1.0000 | Freq: Two times a day (BID) | NASAL | 1 refills | Status: AC
Start: 2024-05-28 — End: ?

## 2024-05-28 MED ORDER — PREDNISONE 20 MG PO TABS
40.0000 mg | ORAL_TABLET | Freq: Every day | ORAL | 0 refills | Status: AC
Start: 2024-05-28 — End: 2024-06-02

## 2024-05-28 MED ORDER — PROMETHAZINE-DM 6.25-15 MG/5ML PO SYRP: 5.0000 mL | ORAL_SOLUTION | Freq: Four times a day (QID) | ORAL | 0 refills | Status: DC | PRN

## 2024-05-28 NOTE — ED Provider Notes (Signed)
 [facility]  Note:  This document was prepared using Conservation officer, historic buildings and may include unintentional dictation errors.  MRN: [id number] DOB: Dec 27, 1960  Subjective:   Jennifer [name] is a 63 y.o. female presenting for mild dry cough and throat irritation since yesterday.  Patient reports that this morning cough is making her chest sore.  Patient reports known exposure to coworker who was positive for COVID.  Patient denies any fever, nasal congestion, body aches, fatigue, mucus production with cough.  Denies taking any over-the-counter medication to treat symptoms.  No current facility-administered medications for this encounter.  Current Outpatient Medications:    azelastine (ASTELIN) 0.1 % nasal spray, Place 1 spray into both nostrils 2 (two) times daily. Use in each nostril as directed, Disp: 30 mL, Rfl: 1   cholestyramine (QUESTRAN) 4 GM/DOSE powder, Take 4 g by mouth daily as needed (stomach pain)., Disp: , Rfl:    estradiol (ESTRACE) 1 MG tablet, Take 1 mg by mouth daily. , Disp: , Rfl:    hydrochlorothiazide (HYDRODIURIL) 25 MG tablet, Take 25 mg by mouth daily., Disp: , Rfl:    meloxicam (MOBIC) 15 MG tablet, Take 1 tablet by mouth daily., Disp: , Rfl:    nystatin ointment (MYCOSTATIN), Apply 1 Application topically 2 (two) times daily., Disp: , Rfl:    predniSONE (DELTASONE) 20 MG tablet, Take 2 tablets (40 mg total) by mouth daily for 5 days., Disp: 10 tablet, Rfl: 0   promethazine-dextromethorphan (PROMETHAZINE-DM) 6.25-15 MG/5ML syrup, Take 5 mLs by mouth 4 (four) times daily as needed for cough., Disp: 240 mL, Rfl: 0   triamcinolone ointment (KENALOG) 0.1 %, Apply 1 Application topically 2 (two) times daily., Disp: , Rfl:    amoxicillin (AMOXIL) 500 MG capsule, Take 500 mg by mouth 2 (two) times daily., Disp: , Rfl:    cefdinir (OMNICEF) 300 MG capsule, Take 300 mg by mouth 2 (two) times daily., Disp: , Rfl:    dicyclomine (BENTYL) 20 MG tablet, Take 1  tablet (20 mg total) by mouth every 6 (six) hours as needed for spasms (diarrhea, abdominal cramps)., Disp: 60 tablet, Rfl: 0   doxycycline (VIBRAMYCIN) 100 MG capsule, Take 100 mg by mouth 2 (two) times daily., Disp: , Rfl:    ESTRADIOL PO, Take 1 mg by mouth daily., Disp: , Rfl:    fluconazole (DIFLUCAN) 150 MG tablet, Take 150 mg by mouth once., Disp: , Rfl:    fluticasone (FLONASE) 50 MCG/ACT nasal spray, Place 1 spray into both nostrils daily as needed for allergies or rhinitis., Disp: , Rfl:    HYDROcodone bit-homatropine (HYCODAN) 5-1.5 MG/5ML syrup, Take 5 mLs by mouth every 6 (six) hours as needed for cough., Disp: 90 mL, Rfl: 0   lansoprazole (PREVACID) 30 MG capsule, Take 30 mg by mouth daily at 2 PM., Disp: , Rfl:    loratadine (CLARITIN) 10 MG tablet, Take 10 mg by mouth daily as needed for allergies., Disp: , Rfl:    metroNIDAZOLE (FLAGYL) 500 MG tablet, Take 1 tablet by mouth 2 (two) times daily., Disp: , Rfl:    Multiple Vitamins-Minerals (MULTIVITAMIN WITH MINERALS) tablet, Take 1 tablet by mouth daily., Disp: , Rfl:    nystatin-triamcinolone ointment (MYCOLOG), Apply 1 Application topically as directed., Disp: , Rfl:    traMADol (ULTRAM) 50 MG tablet, Take 50 mg by mouth every 6 (six) hours as needed for moderate pain. , Disp: , Rfl:    Zinc Sulfate (ZINC 15 PO), Take 1 tablet by  mouth daily., Disp: , Rfl:    Allergies  Allergen Reactions   Darvocet [Propoxyphene N-Acetaminophen] Swelling   Etodolac Swelling    Other Reaction(s): Not available   Latex Hives    contact only'  Other Reaction(s): Not available   Methocarbamol Swelling   Oxycodone     Other reaction(s): Not available  Other Reaction(s): Not available, Not available, Not available   Propoxyphene     Other Reaction(s): Not available    Past Medical History:  Diagnosis Date   Arthritis    arthritis back and knees   Blood transfusion 05/2011   Hx of adenomatous polyp of colon 06/30/2022   Diminutive  adenoma- recall 10 years   Hypertension    IBS (irritable bowel syndrome)    Transfusion history    s/p 2012 RTKA ([facility])     Past Surgical History:  Procedure Laterality Date   ABDOMINAL HYSTERECTOMY  1996   partial   BACK SURGERY  2003/2007   discectomy lower back '03/ '07 rods and artificial disk- Fusion done last time.   CERVICAL FUSION  2006   CHOLECYSTECTOMY  2006   COLONOSCOPY     TOTAL KNEE ARTHROPLASTY  04/2011   Right   TOTAL KNEE ARTHROPLASTY Left 08/18/2016   Procedure: LEFT TOTAL KNEE ARTHROPLASTY;  Surgeon: Dempsey Moan, MD;  Location: [facility];  Service: Orthopedics;  Laterality: Left;   TOTAL KNEE REVISION Right 04/11/2020   Procedure: Right knee polyethylene exchange;  Surgeon: Moan Dempsey, MD;  Location: [facility];  Service: Orthopedics;  Laterality: Right;     Family History  Problem Relation Age of Onset   Prostate cancer Father    Diabetes Brother    Crohn's disease Brother    Diabetes Brother    Colon cancer Neg Hx    Esophageal cancer Neg Hx    Pancreatic cancer Neg Hx    Stomach cancer Neg Hx    Liver disease Neg Hx    Colon polyps Neg Hx    Rectal cancer Neg Hx     Social History   Tobacco Use   Smoking status: Never   Smokeless tobacco: Never  Vaping Use   Vaping status: Never Used  Substance Use Topics   Alcohol use: No   Drug use: No    ROS Refer to HPI for ROS details.  Objective:   Vitals: BP 127/75 (BP Location: Left Arm)   Pulse 80   Temp 98.1 F (36.7 C) (Oral)   Resp 18   Ht 5' 5 (1.651 m)   Wt 200 lb (90.7 kg)   SpO2 98%   BMI 33.28 kg/m   Physical Exam Vitals and nursing note reviewed.  Constitutional:      General: She is not in acute distress.    Appearance: She is well-developed. She is not ill-appearing or toxic-appearing.  HENT:     Head: Normocephalic and atraumatic.     Nose: No congestion or rhinorrhea.     Mouth/Throat:     Mouth: Mucous membranes are moist.     Pharynx: No oropharyngeal  exudate or posterior oropharyngeal erythema.  Eyes:     Extraocular Movements:     Right eye: Normal extraocular motion.     Left eye: Normal extraocular motion.     Conjunctiva/sclera: Conjunctivae normal.  Cardiovascular:     Rate and Rhythm: Normal rate and regular rhythm.     Heart sounds: No murmur heard. Pulmonary:     Effort: Pulmonary effort is normal.  No respiratory distress.     Breath sounds: No stridor. No wheezing, rhonchi or rales.  Chest:     Chest wall: No tenderness.  Musculoskeletal:        General: Normal range of motion.  Skin:    General: Skin is warm and dry.  Neurological:     General: No focal deficit present.     Mental Status: She is alert and oriented to person, place, and time.  Psychiatric:        Mood and Affect: Mood normal.        Behavior: Behavior normal.     Procedures  Results for orders placed or performed during the hospital encounter of 05/28/24 (from the past 24 hours)  POC Covid19/Flu A&B Antigen     Status: Abnormal   Collection Time: 05/28/24 11:14 AM  Result Value Ref Range   Influenza A Antigen, POC Negative Negative   Influenza B Antigen, POC Negative Negative   Covid Antigen, POC Positive (A) Negative  POCT rapid strep A     Status: Normal   Collection Time: 05/28/24 11:14 AM  Result Value Ref Range   Rapid Strep A Screen Negative Negative    No results found.   Assessment and Plan :     Discharge Instructions       1. COVID-19 (Primary) - POC Covid19/Flu A&B Antigen completed in UC is positive for COVID-19, negative for influenza - POCT rapid strep A complete in UC is negative for strep pharyngitis - azelastine (ASTELIN) 0.1 % nasal spray; Place 1 spray into both nostrils 2 (two) times daily. Use in each nostril as directed  Dispense: 30 mL; Refill: 1 - promethazine-dextromethorphan (PROMETHAZINE-DM) 6.25-15 MG/5ML syrup; Take 5 mLs by mouth 4 (four) times daily as needed for cough.  Dispense: 240 mL; Refill: 0 -  predniSONE (DELTASONE) 20 MG tablet; Take 2 tablets (40 mg total) by mouth daily for 5 days.  Dispense: 10 tablet; Refill: 0 -Continue to monitor symptoms for any change in severity if there is any escalation of current symptoms or development of new symptoms follow-up in ER for further evaluation and management.       Alexanderjames Berg B Meigan Pates   Juventino Pavone, [place], [place] 05/28/24 1201

## 2024-05-28 NOTE — Discharge Instructions (Signed)
  1. COVID-19 (Primary) - POC Covid19/Flu A&B Antigen completed in UC is positive for COVID-19, negative for influenza - POCT rapid strep A complete in UC is negative for strep pharyngitis - azelastine (ASTELIN) 0.1 % nasal spray; Place 1 spray into both nostrils 2 (two) times daily. Use in each nostril as directed  Dispense: 30 mL; Refill: 1 - promethazine-dextromethorphan (PROMETHAZINE-DM) 6.25-15 MG/5ML syrup; Take 5 mLs by mouth 4 (four) times daily as needed for cough.  Dispense: 240 mL; Refill: 0 - predniSONE (DELTASONE) 20 MG tablet; Take 2 tablets (40 mg total) by mouth daily for 5 days.  Dispense: 10 tablet; Refill: 0 -Continue to monitor symptoms for any change in severity if there is any escalation of current symptoms or development of new symptoms follow-up in ER for further evaluation and management.

## 2024-05-28 NOTE — ED Triage Notes (Signed)
 Starting yesterday, I have had a dry cough, with throat irritation on the right hand side, this morning the cough is making my chest sore. No fever. No runny nose. No new/unexplained rash.

## 2024-06-30 DIAGNOSIS — I10 Essential (primary) hypertension: Secondary | ICD-10-CM | POA: Diagnosis not present

## 2024-06-30 DIAGNOSIS — J0191 Acute recurrent sinusitis, unspecified: Secondary | ICD-10-CM | POA: Diagnosis not present

## 2024-06-30 DIAGNOSIS — N951 Menopausal and female climacteric states: Secondary | ICD-10-CM | POA: Diagnosis not present

## 2024-07-12 ENCOUNTER — Encounter: Payer: Self-pay | Admitting: Gastroenterology

## 2024-07-12 ENCOUNTER — Ambulatory Visit: Admitting: Gastroenterology

## 2024-07-12 ENCOUNTER — Other Ambulatory Visit (INDEPENDENT_AMBULATORY_CARE_PROVIDER_SITE_OTHER)

## 2024-07-12 VITALS — BP 118/68 | HR 67 | Ht 65.0 in | Wt 209.2 lb

## 2024-07-12 DIAGNOSIS — R1032 Left lower quadrant pain: Secondary | ICD-10-CM | POA: Insufficient documentation

## 2024-07-12 LAB — BASIC METABOLIC PANEL WITH GFR
BUN: 13 mg/dL (ref 6–23)
CO2: 29 meq/L (ref 19–32)
Calcium: 9.4 mg/dL (ref 8.4–10.5)
Chloride: 104 meq/L (ref 96–112)
Creatinine, Ser: 0.88 mg/dL (ref 0.40–1.20)
GFR: 69.8 mL/min (ref >60.00)
Glucose, Bld: 89 mg/dL (ref 70–99)
Potassium: 4 meq/L (ref 3.5–5.1)
Sodium: 141 meq/L (ref 135–145)

## 2024-07-12 NOTE — Patient Instructions (Signed)
 Your provider has requested that you go to the basement level for lab work before leaving today. Press B on the elevator. The lab is located at the first door on the left as you exit the elevator.   You have been scheduled for a CT scan of the abdomen and pelvis at [facility], 1st floor Radiology. You are scheduled on Friday 07/15/24 at 1:30 pm. You should arrive 15 minutes prior to your appointment time for registration.    Please follow the written instructions below on the day of your exam:   1) Do not eat anything after 11:30 am (4 hours prior to your test)   You may take any medications as prescribed with a small amount of water , if necessary. If you take any of the following medications: METFORMIN, GLUCOPHAGE, GLUCOVANCE, AVANDAMET, RIOMET, FORTAMET, ACTOPLUS MET, JANUMET, GLUMETZA or METAGLIP, you MAY be asked to HOLD this medication 48 hours AFTER the exam.   The purpose of you drinking the oral contrast is to aid in the visualization of your intestinal tract. The contrast solution may cause some diarrhea. Depending on your individual set of symptoms, you may also receive an intravenous injection of x-ray contrast/dye. Plan on being at [facility] for 45 minutes or longer, depending on the type of exam you are having performed.   If you have any questions regarding your exam or if you need to reschedule, you may call [facility] at [phone] between the hours of 8:00 am and 5:00 pm, Monday-Friday.

## 2024-07-12 NOTE — Progress Notes (Signed)
 07/12/2024 PRANAVI AURE [id number] 1960-10-23   HISTORY OF PRESENT ILLNESS: This is a 63 year old female who is a patient of Dr. Darilyn.  Here today with complaints of left lower quadrant abdominal pain.  She tells me the pain has been present for about the past 2 to 2-1/2 months.  She also had some vaginal irritation so saw her GYN.  They performed a pelvic ultrasound, etc. they did not find any GYN source of her pain so told her to follow-up here.  She tells me that the pain was present for about a month and then began to taper off.  Now it is back again.  She says that it has gotten severe at times and will have her in tears, but then eases up.  She says that she moves her bowels fairly regularly, but not every day.  She says she ahs never moved them every day.  Does not always feel like she has a complete bowel movement.  No rectal bleeding.  No nausea, vomiting, fever, chills.  Never had diverticulitis in the past.  Colonoscopy 06/2022: - One 3 mm polyp in the cecum, removed with a cold snare. Resected and retrieved. - Diverticulosis in the sigmoid colon and in the ascending colon. - The examination was otherwise normal on direct and retroflexion views.  Polyp was an adenoma, recall entered for 10 years.   Past Medical History:  Diagnosis Date   Arthritis    arthritis back and knees   Blood transfusion 05/2011   Hx of adenomatous polyp of colon 06/30/2022   Diminutive adenoma- recall 10 years   Hypertension    IBS (irritable bowel syndrome)    Transfusion history    s/p 2012 RTKA ([facility])   Past Surgical History:  Procedure Laterality Date   ABDOMINAL HYSTERECTOMY  1996   partial   BACK SURGERY  2003/2007   discectomy lower back '03/ '07 rods and artificial disk- Fusion done last time.   CERVICAL FUSION  2006   CHOLECYSTECTOMY  2006   COLONOSCOPY     TOTAL KNEE ARTHROPLASTY  04/2011   Right   TOTAL KNEE ARTHROPLASTY Left 08/18/2016   Procedure: LEFT TOTAL KNEE  ARTHROPLASTY;  Surgeon: Dempsey Moan, MD;  Location: [facility];  Service: Orthopedics;  Laterality: Left;   TOTAL KNEE REVISION Right 04/11/2020   Procedure: Right knee polyethylene exchange;  Surgeon: Moan Dempsey, MD;  Location: [facility];  Service: Orthopedics;  Laterality: Right;     reports that she has never smoked. She has never used smokeless tobacco. She reports that she does not drink alcohol  and does not use drugs. family history includes Crohn's disease in her brother; Diabetes in her brother and brother; Prostate cancer in her father. Allergies  Allergen Reactions   Darvocet [Propoxyphene N-Acetaminophen ] Swelling   Etodolac Swelling    Other Reaction(s): Not available   Latex Hives    contact only'  Other Reaction(s): Not available   Methocarbamol Swelling   Oxycodone      Other reaction(s): Not available  Other Reaction(s): Not available, Not available, Not available   Propoxyphene     Other Reaction(s): Not available      Outpatient Encounter Medications as of 07/12/2024  Medication Sig   amoxicillin  (AMOXIL ) 500 MG capsule Take 500 mg by mouth 2 (two) times daily.   azelastine  (ASTELIN ) 0.1 % nasal spray Place 1 spray into both nostrils 2 (two) times daily. Use in each nostril as directed   azithromycin  (ZITHROMAX )  250 MG tablet Take 250 mg by mouth once.   cholestyramine  (QUESTRAN ) 4 GM/DOSE powder Take 4 g by mouth daily as needed (stomach pain).   dicyclomine  (BENTYL ) 20 MG tablet Take 1 tablet (20 mg total) by mouth every 6 (six) hours as needed for spasms (diarrhea, abdominal cramps).   ESTRADIOL PO Take 1 mg by mouth daily.   fluconazole  (DIFLUCAN ) 150 MG tablet Take 150 mg by mouth once.   fluticasone  (FLONASE ) 50 MCG/ACT nasal spray Place 1 spray into both nostrils daily as needed for allergies or rhinitis.   hydrochlorothiazide  (HYDRODIURIL ) 25 MG tablet Take 25 mg by mouth daily.   lansoprazole (PREVACID) 30 MG capsule Take 30 mg by mouth daily at 2 PM.    montelukast (SINGULAIR) 10 MG tablet Take 10 mg by mouth daily as needed.   Multiple Vitamins-Minerals (MULTIVITAMIN WITH MINERALS) tablet Take 1 tablet by mouth daily.   nystatin ointment (MYCOSTATIN) Apply 1 Application topically 2 (two) times daily.   nystatin-triamcinolone  ointment (MYCOLOG) Apply 1 Application topically as directed.   promethazine -dextromethorphan (PROMETHAZINE -DM) 6.25-15 MG/5ML syrup Take 5 mLs by mouth 4 (four) times daily as needed for cough.   SODIUM FLUORIDE , DENTAL RINSE, (PREVIDENT) 0.2 % SOLN Take 0.2 % by mouth 2 (two) times a week.   triamcinolone  ointment (KENALOG ) 0.1 % Apply 1 Application topically 2 (two) times daily.   Zinc Sulfate (ZINC 15 PO) Take 1 tablet by mouth daily.   [DISCONTINUED] cefdinir (OMNICEF) 300 MG capsule Take 300 mg by mouth 2 (two) times daily.   [DISCONTINUED] doxycycline (VIBRAMYCIN) 100 MG capsule Take 100 mg by mouth 2 (two) times daily.   [DISCONTINUED] estradiol (ESTRACE) 1 MG tablet Take 1 mg by mouth daily.    [DISCONTINUED] HYDROcodone  bit-homatropine (HYCODAN) 5-1.5 MG/5ML syrup Take 5 mLs by mouth every 6 (six) hours as needed for cough.   [DISCONTINUED] loratadine  (CLARITIN ) 10 MG tablet Take 10 mg by mouth daily as needed for allergies.   [DISCONTINUED] meloxicam (MOBIC) 15 MG tablet Take 1 tablet by mouth daily.   [DISCONTINUED] metroNIDAZOLE  (FLAGYL ) 500 MG tablet Take 1 tablet by mouth 2 (two) times daily.   [DISCONTINUED] traMADol  (ULTRAM ) 50 MG tablet Take 50 mg by mouth every 6 (six) hours as needed for moderate pain.    No facility-administered encounter medications on file as of 07/12/2024.    REVIEW OF SYSTEMS  : All other systems reviewed and negative except where noted in the History of Present Illness.   PHYSICAL EXAM: BP 118/68 (BP Location: Right Arm, Patient Position: Sitting, Cuff Size: Large)   Pulse 67   Ht 5' 5 (1.651 m)   Wt 209 lb 4 oz (94.9 kg)   BMI 34.82 kg/m  General: Well developed with  AA female in no acute distress Head: Normocephalic and atraumatic Eyes:  Sclerae anicteric, conjunctive pink. Ears: Normal auditory acuity Lungs: Clear throughout to auscultation; no W/R/R. Heart: Regular rate and rhythm; no M/R/G. Abdomen: Soft, non-distended.  BS present.  LLQ TTP. Musculoskeletal: Symmetrical with no gross deformities  Skin: No lesions on visible extremities Extremities: No edema  Neurological: Alert oriented x 4, grossly non-focal Psychological:  Alert and cooperative. Normal mood and affect  ASSESSMENT AND PLAN: *LLQ abdominal pain:  Present for the past 2-2.5 months on and off, severe at times.  GYN ruled out with pelvic U/S.  No other symptoms.  No significant constipation but does not have BM daily.  No history of diverticulitis.  Will order CT scan of the  abdomen and pelvis with contrast.  ? Diverticulitis vs constipation vs other.   CC:  Jefferey Fitch, MD

## 2024-07-15 ENCOUNTER — Ambulatory Visit (HOSPITAL_COMMUNITY)
Admission: RE | Admit: 2024-07-15 | Discharge: 2024-07-15 | Disposition: A | Discharge status: Home / Self Care | Source: Ambulatory Visit | Attending: Gastroenterology | Admitting: Gastroenterology

## 2024-07-15 DIAGNOSIS — R1032 Left lower quadrant pain: Secondary | ICD-10-CM | POA: Diagnosis not present

## 2024-07-15 DIAGNOSIS — Z9049 Acquired absence of other specified parts of digestive tract: Secondary | ICD-10-CM | POA: Diagnosis not present

## 2024-07-15 DIAGNOSIS — K5730 Diverticulosis of large intestine without perforation or abscess without bleeding: Secondary | ICD-10-CM | POA: Diagnosis not present

## 2024-07-15 MED ORDER — IOHEXOL 9 MG/ML PO SOLN
ORAL | Status: AC
  Filled 2024-07-15: qty 1000

## 2024-07-15 MED ORDER — IOHEXOL 9 MG/ML PO SOLN
1000.0000 mL | ORAL | Status: AC
  Administered 2024-07-15: 1000 mL via ORAL

## 2024-07-15 MED ORDER — IOHEXOL 300 MG/ML  SOLN
100.0000 mL | Freq: Once | INTRAMUSCULAR | Status: AC | PRN
  Administered 2024-07-15: 100 mL via INTRAVENOUS

## 2024-07-18 ENCOUNTER — Ambulatory Visit: Payer: Self-pay | Admitting: Gastroenterology

## 2024-07-21 ENCOUNTER — Other Ambulatory Visit: Payer: Self-pay | Admitting: Otolaryngology

## 2024-07-21 DIAGNOSIS — R1310 Dysphagia, unspecified: Secondary | ICD-10-CM

## 2024-08-09 DIAGNOSIS — H524 Presbyopia: Secondary | ICD-10-CM | POA: Diagnosis not present

## 2024-08-15 ENCOUNTER — Other Ambulatory Visit: Payer: Self-pay | Admitting: Internal Medicine

## 2024-08-15 DIAGNOSIS — Z1231 Encounter for screening mammogram for malignant neoplasm of breast: Secondary | ICD-10-CM

## 2024-08-25 ENCOUNTER — Inpatient Hospital Stay: Admission: RE | Admit: 2024-08-25 | Source: Ambulatory Visit

## 2024-09-28 ENCOUNTER — Inpatient Hospital Stay: Admission: RE | Admit: 2024-09-28 | Discharge: 2024-09-28 | Attending: Internal Medicine | Admitting: Internal Medicine

## 2024-09-28 DIAGNOSIS — Z1231 Encounter for screening mammogram for malignant neoplasm of breast: Secondary | ICD-10-CM | POA: Diagnosis not present

## 2024-10-11 NOTE — Progress Notes (Unsigned)
 "  New Patient Note  RE: Jennifer [name] MRN: [id number] DOB: [birth year] Date of Office Visit: 10/12/2024  Consult requested by: Llewellyn Gerard LABOR, DO Primary care provider: Jefferey Fitch, MD  Chief Complaint: No chief complaint on file.  History of Present Illness: I had the pleasure of seeing Jennifer [name] for initial evaluation at the [facility] on 10/12/2024. She is a 63 y.o. female, who is referred here by Skotnicki, Meghan A, DO for the evaluation of allergic rhinitis.  Discussed the use of AI scribe software for clinical note transcription with the patient, who gave verbal consent to proceed.  History of Present Illness             She reports symptoms of ***. Symptoms have been going on for *** years. The symptoms are present *** all year around with worsening in ***. Other triggers include exposure to ***. Anosmia: ***. Headache: ***. She has used *** with ***fair improvement in symptoms. Sinus infections: ***. Previous work up includes: ***. Previous ENT evaluation: ***. Previous sinus imaging: ***. History of nasal polyps: ***. Last eye exam: ***. History of reflux: ***.  07/20/2024 ENT visit: 1. Thyroid  nodules. 2. Dysphagia Recently performed biopsy of the right thyroid  nodules consistent with benign process. Patient has another nodule in the thyroid  gland meeting criteria for continued surveillance. Recommend obtaining another ultrasound in approximately 12 months for continued monitoring. Fiberoptic laryngoscopy performed today demonstrates moderate interarytenoid edema and erythema consistent with patient's known history of GERD, but is otherwise normal. Low suspicion for mass effect of thyroid  nodules causing patient's esophageal dysphagia, however we will order a dedicated swallow study for further evaluation.  3. Sinus infections. Experienced 2 to 3 sinus infections in the past 6 months, with the last one occurring in 05/2024. Treated with  antibiotics for 4 days, which resolved symptoms. The nasal endoscopy showed no signs of residual infection, polyps, active bleeding, or dried blood. The back of the throat appeared normal except for some reflux-related swelling. A referral to an allergist will be made to determine if allergies are contributing to the recurrent infections. If the allergy workup is negative and recurrent infections persist, a CT scan of the sinuses may be considered.  4. Allergies. Reports seasonal and environmental allergies but has never been formally tested. Currently taking Zyrtec and Flonase , and uses Astelin  nasal spray. A referral to an allergist will be made for formal allergy testing. If found to have significant environmental allergies, may be a candidate for allergy shots.   Assessment and Plan: Jennifer [name] is a 63 y.o. female with: ***  Assessment and Plan               No follow-ups on file.  No orders of the defined types were placed in this encounter.  Lab Orders  No laboratory test(s) ordered today    Other allergy screening: Asthma: {Blank single:19197::yes,no} Rhino conjunctivitis: {Blank single:19197::yes,no} Food allergy: {Blank single:19197::yes,no} Medication allergy: {Blank single:19197::yes,no} Hymenoptera allergy: {Blank single:19197::yes,no} Urticaria: {Blank single:19197::yes,no} Eczema:{Blank single:19197::yes,no} History of recurrent infections suggestive of immunodeficency: {Blank single:19197::yes,no}  Diagnostics: Spirometry:  Tracings reviewed. Her effort: {Blank single:19197::Good reproducible efforts.,It was hard to get consistent efforts and there is a question as to whether this reflects a maximal maneuver.,Poor effort, data can not be interpreted.} FVC: ***L FEV1: ***L, ***% predicted FEV1/FVC ratio: ***% Interpretation: {Blank single:19197::Spirometry consistent with mild obstructive disease,Spirometry consistent with  moderate obstructive disease,Spirometry consistent with severe obstructive disease,Spirometry consistent with possible restrictive disease,Spirometry consistent  with mixed obstructive and restrictive disease,Spirometry uninterpretable due to technique,Spirometry consistent with normal pattern,No overt abnormalities noted given today's efforts}.  Please see scanned spirometry results for details.  Skin Testing: {Blank single:19197::Select foods,Environmental allergy panel,Environmental allergy panel and select foods,Food allergy panel,None,Deferred due to recent antihistamines use}. *** Results discussed with patient/family.   Past Medical History: Patient Active Problem List   Diagnosis Date Noted   LLQ abdominal pain 07/12/2024   Atrophic vaginitis 05/28/2024   Cyst of vagina 05/28/2024   Diverticulosis of sigmoid colon 05/28/2024   Essential hypertension 05/28/2024   History of cholecystectomy 05/28/2024   Unspecified abdominal pain 05/10/2024   Stiffness of left ankle joint 12/15/2023   Arthralgia of left ankle 12/15/2023   Hx of adenomatous polyp of colon 06/30/2022   Pain in finger of left hand 05/08/2021   Irritable bowel syndrome with diarrhea 11/26/2020   Subcutaneous hematoma 04/24/2020   Aftercare following joint replacement surgery 04/20/2020   Stiffness of right knee 04/17/2020   Failed total right knee replacement 04/11/2020   Carpal tunnel syndrome of left wrist 02/08/2020   Lactose intolerance 12/07/2018   Pain in left knee 04/08/2018   Chronic back pain 12/23/2017   OA (osteoarthritis) of knee 08/18/2016   Past Medical History:  Diagnosis Date   Arthritis    arthritis back and knees   Blood transfusion 05/2011   Hx of adenomatous polyp of colon 06/30/2022   Diminutive adenoma- recall 10 years   Hypertension    IBS (irritable bowel syndrome)    Transfusion history    s/p 2012 RTKA ([facility])   Past Surgical History: Past Surgical  History:  Procedure Laterality Date   ABDOMINAL HYSTERECTOMY  1996   partial   BACK SURGERY  2003/2007   discectomy lower back '03/ '07 rods and artificial disk- Fusion done last time.   CERVICAL FUSION  2006   CHOLECYSTECTOMY  2006   COLONOSCOPY     TOTAL KNEE ARTHROPLASTY  04/2011   Right   TOTAL KNEE ARTHROPLASTY Left 08/18/2016   Procedure: LEFT TOTAL KNEE ARTHROPLASTY;  Surgeon: Dempsey Moan, MD;  Location: [facility];  Service: Orthopedics;  Laterality: Left;   TOTAL KNEE REVISION Right 04/11/2020   Procedure: Right knee polyethylene exchange;  Surgeon: Moan Dempsey, MD;  Location: [facility];  Service: Orthopedics;  Laterality: Right;    Medication List:  Current Outpatient Medications  Medication Sig Dispense Refill   amoxicillin  (AMOXIL ) 500 MG capsule Take 500 mg by mouth 2 (two) times daily.     azelastine  (ASTELIN ) 0.1 % nasal spray Place 1 spray into both nostrils 2 (two) times daily. Use in each nostril as directed 30 mL 1   azithromycin  (ZITHROMAX ) 250 MG tablet Take 250 mg by mouth once.     cholestyramine  (QUESTRAN ) 4 GM/DOSE powder Take 4 g by mouth daily as needed (stomach pain).     dicyclomine  (BENTYL ) 20 MG tablet Take 1 tablet (20 mg total) by mouth every 6 (six) hours as needed for spasms (diarrhea, abdominal cramps). 60 tablet 0   ESTRADIOL PO Take 1 mg by mouth daily.     fluconazole  (DIFLUCAN ) 150 MG tablet Take 150 mg by mouth once.     fluticasone  (FLONASE ) 50 MCG/ACT nasal spray Place 1 spray into both nostrils daily as needed for allergies or rhinitis.     hydrochlorothiazide  (HYDRODIURIL ) 25 MG tablet Take 25 mg by mouth daily.     lansoprazole (PREVACID) 30 MG capsule Take 30 mg by mouth daily  at 2 PM.     montelukast (SINGULAIR) 10 MG tablet Take 10 mg by mouth daily as needed.     Multiple Vitamins-Minerals (MULTIVITAMIN WITH MINERALS) tablet Take 1 tablet by mouth daily.     nystatin ointment (MYCOSTATIN) Apply 1 Application topically 2 (two) times  daily.     nystatin-triamcinolone  ointment (MYCOLOG) Apply 1 Application topically as directed.     promethazine -dextromethorphan (PROMETHAZINE -DM) 6.25-15 MG/5ML syrup Take 5 mLs by mouth 4 (four) times daily as needed for cough. 240 mL 0   SODIUM FLUORIDE , DENTAL RINSE, (PREVIDENT) 0.2 % SOLN Take 0.2 % by mouth 2 (two) times a week.     triamcinolone  ointment (KENALOG ) 0.1 % Apply 1 Application topically 2 (two) times daily.     Zinc Sulfate (ZINC 15 PO) Take 1 tablet by mouth daily.     No current facility-administered medications for this visit.   Allergies: Allergies[1] Social History: Social History   Socioeconomic History   Marital status: Widowed    Spouse name: Not on file   Number of children: 3   Years of education: Not on file   Highest education level: Not on file  Occupational History    Employer: DISABLED   Tobacco Use   Smoking status: Never   Smokeless tobacco: Never  Vaping Use   Vaping status: Never Used  Substance and Sexual Activity   Alcohol  use: No   Drug use: No   Sexual activity: Not Currently  Other Topics Concern   Not on file  Social History Narrative   She is widowed and she has 3 daughters as grandchildren also   Disabled   Never smoker no other tobacco   No alcohol , no illicit drug use   1 caffeinated beverage daily   Social Drivers of Health   Tobacco Use: Low Risk (07/20/2024)   Received from [facility]   Patient History    Smoking Tobacco Use: Never    Smokeless Tobacco Use: Never    Passive Exposure: Not on file  Financial Resource Strain: Not on file  Food Insecurity: Not on file  Transportation Needs: Not on file  Physical Activity: Not on file  Stress: Not on file  Social Connections: Not on file  Depression (EYV7-0): Not on file  Alcohol  Screen: Not on file  Housing: Not on file  Utilities: Not on file  Health Literacy: Not on file   Lives in a ***. Smoking: *** Occupation: ***  Environmental History: Water   Damage/mildew in the house: {Blank single:19197::yes,no} Carpet in the family room: {Blank single:19197::yes,no} Carpet in the bedroom: {Blank single:19197::yes,no} Heating: {Blank single:19197::electric,gas,heat pump} Cooling: {Blank single:19197::central,window,heat pump} Pet: {Blank single:19197::yes ***,no}  Family History: Family History  Problem Relation Age of Onset   Prostate cancer Father    Diabetes Brother    Crohn's disease Brother    Diabetes Brother    Colon cancer Neg Hx    Esophageal cancer Neg Hx    Pancreatic cancer Neg Hx    Stomach cancer Neg Hx    Liver disease Neg Hx    Colon polyps Neg Hx    Rectal cancer Neg Hx    Problem                               Relation Asthma                                   ***  Eczema                                *** Food allergy                          *** Allergic rhino conjunctivitis     ***  Review of Systems  Constitutional:  Negative for appetite change, chills, fever and unexpected weight change.  HENT:  Negative for congestion and rhinorrhea.   Eyes:  Negative for itching.  Respiratory:  Negative for cough, chest tightness, shortness of breath and wheezing.   Cardiovascular:  Negative for chest pain.  Gastrointestinal:  Negative for abdominal pain.  Genitourinary:  Negative for difficulty urinating.  Skin:  Negative for rash.  Neurological:  Negative for headaches.    Objective: There were no vitals taken for this visit. There is no height or weight on file to calculate BMI. Physical Exam Vitals and nursing note reviewed.  Constitutional:      Appearance: Normal appearance. She is well-developed.  HENT:     Head: Normocephalic and atraumatic.     Right Ear: Tympanic membrane and external ear normal.     Left Ear: Tympanic membrane and external ear normal.     Nose: Nose normal.     Mouth/Throat:     Mouth: Mucous membranes are moist.     Pharynx: Oropharynx is clear.  Eyes:      Conjunctiva/sclera: Conjunctivae normal.  Cardiovascular:     Rate and Rhythm: Normal rate and regular rhythm.     Heart sounds: Normal heart sounds. No murmur heard.    No friction rub. No gallop.  Pulmonary:     Effort: Pulmonary effort is normal.     Breath sounds: Normal breath sounds. No wheezing, rhonchi or rales.  Musculoskeletal:     Cervical back: Neck supple.  Skin:    General: Skin is warm.     Findings: No rash.  Neurological:     Mental Status: She is alert and oriented to person, place, and time.  Psychiatric:        Behavior: Behavior normal.    The plan was reviewed with the patient/family, and all questions/concerned were addressed.  It was my pleasure to see Jennifer [name] today and participate in her care. Please feel free to contact me with any questions or concerns.  Sincerely,  Orlan Cramp, DO Allergy & Immunology  [facility]  [facility] office: [phone] [facility] office: [phone]    [1]  Allergies Allergen Reactions   Darvocet [Propoxyphene N-Acetaminophen ] Swelling   Etodolac Swelling    Other Reaction(s): Not available   Latex Hives    contact only'  Other Reaction(s): Not available   Methocarbamol Swelling   Oxycodone      Other reaction(s): Not available  Other Reaction(s): Not available, Not available, Not available   Propoxyphene     Other Reaction(s): Not available   "

## 2024-10-12 ENCOUNTER — Ambulatory Visit: Admitting: Allergy

## 2024-10-12 ENCOUNTER — Encounter: Payer: Self-pay | Admitting: Allergy

## 2024-10-12 ENCOUNTER — Other Ambulatory Visit: Payer: Self-pay

## 2024-10-12 VITALS — BP 110/80 | HR 72 | Temp 97.2°F | Ht 64.0 in | Wt 204.9 lb

## 2024-10-12 DIAGNOSIS — J3089 Other allergic rhinitis: Secondary | ICD-10-CM | POA: Diagnosis not present

## 2024-10-12 DIAGNOSIS — J329 Chronic sinusitis, unspecified: Secondary | ICD-10-CM | POA: Diagnosis not present

## 2024-10-12 NOTE — Patient Instructions (Addendum)
 Rhinitis  Return for allergy skin testing. Will make additional recommendations based on results. Make sure you don't take any antihistamines for 3 days before the skin testing appointment. Don't take any zyrtec or the azelastine  nasal spray for 3 days before. Don't put any lotion on the back and arms on the day of testing.  Must be in good health and not ill. No vaccines/injections/antibiotics within the past 7 days.  Plan on being here for 30-60 minutes.  Continue Singulair (montelukast) 10mg  daily at night. Use Flonase  (fluticasone ) nasal spray 1-2 sprays per nostril once a day as needed for nasal congestion.  Nasal saline spray (i.e., Simply Saline) or nasal saline lavage (i.e., NeilMed) is recommended as needed and prior to medicated nasal sprays.  ONLY USE IF YOU HAVE DRAINAGE: Use azelastine  nasal spray 1-2 sprays per nostril twice a day as needed for runny nose/drainage. OR Use Atrovent  (ipratropium) 0.03% 1-2 sprays per nostril twice a day as needed for runny nose/drainage.  Infections Keep track of infections and antibiotics use. If persistent will get bloodwork next to look at immune system.   Follow up for skin testing.

## 2024-10-18 NOTE — Progress Notes (Unsigned)
 "  Skin testing note  RE: Jennifer [name] MRN: [id number] DOB: [birth year] Date of Office Visit: 10/19/2024  Referring provider: Jefferey Fitch, MD Primary care provider: Jefferey Fitch, MD  Chief Complaint: skin testing  History of Present Illness: I had the pleasure of seeing Jennifer [name] for a skin testing visit at the [facility] on 10/19/2024. She is a 64 y.o. female, who is being followed for allergic rhinitis and recurrent sinus infections. Her previous allergy  office visit was on 10/12/2024 with Dr. Luke. Today is a skin testing visit.   Discussed the use of AI scribe software for clinical note transcription with the patient, who gave verbal consent to proceed.    She has a history of recurrent sinus infections and allergy  symptoms. Recent allergy  testing revealed positive results for weed pollen, tree pollen, mold, and dust mites.  She uses montelukast and Flonase  regularly, but the frequency and severity of her symptoms persist.  She has previously consulted with an ENT specialist who found no significant issues.   Assessment and Plan: Jennifer [name] is a 64 y.o. female with: Other allergic rhinitis Seasonal allergic rhinitis due to pollen Allergic rhinitis due to dust mite Allergic rhinitis due to mold Past history - Worsening symptoms this year. Prior allergy  testing was negative many years ago. ENT evaluation unremarkable.  10/19/2024 skin testing positive to weed, trees, mold, and dust mites. Start environmental control measures as below. Use over the counter antihistamines such as Zyrtec (cetirizine), Claritin  (loratadine ), Allegra (fexofenadine), or Xyzal (levocetirizine) daily as needed. May take twice a day during allergy  flares. May switch antihistamines every few months. Consider allergy  injections for long term control if above medications do not help the symptoms - handout given.  Continue Singulair (montelukast) 10mg  daily at night. Use Flonase   (fluticasone ) nasal spray 1-2 sprays per nostril once a day as needed for nasal congestion.  Nasal saline spray (i.e., Simply Saline) or nasal saline lavage (i.e., NeilMed) is recommended as needed and prior to medicated nasal sprays.  ONLY USE IF YOU HAVE DRAINAGE: Use azelastine  nasal spray 1-2 sprays per nostril twice a day as needed for runny nose/drainage. OR Use Atrovent  (ipratropium) 0.03% 1-2 sprays per nostril twice a day as needed for runny nose/drainage.   Recurrent sinus infections Past history - 6 courses of antibiotics this year.  Keep track of infections and antibiotics use. Get bloodwork next to look at immune system.   Return in about 4 months (around 02/16/2025).  No orders of the defined types were placed in this encounter.  Lab Orders         CBC with Differential/Platelet         IgG, IgA, IgM         Diphtheria / Tetanus Antibody Panel         Strep pneumoniae 23 Serotypes IgG      Diagnostics: Skin Testing: Environmental allergy  panel. 10/19/2024 skin testing positive to weed, trees, mold, and dust mites. Results discussed with patient/family.  Airborne Adult Perc - 10/19/24 0859     Time Antigen Placed 0859    Allergen Manufacturer Jestine    Location Back    Number of Test 55    1. Control-Buffer 50% Glycerol Negative    2. Control-Histamine 2+    3. Bahia Negative    4. Bermuda Negative    5. Johnson Negative    6. [place]  Blue Negative    7. Meadow Fescue Negative    8. Perennial Rye  Negative    9. Timothy Negative    10. Ragweed Mix Negative    11. Cocklebur Negative    12. Plantain,  English Negative    13. Baccharis Negative    14. Dog Fennel Negative    15. Russian Thistle Negative    16. Lamb's Quarters 2+    17. Sheep Sorrell Negative    18. Rough Pigweed Negative    19. Marsh Elder, Rough Negative    20. Mugwort, Common Negative    21. Box, Elder Negative    22. Cedar, red Negative    23. Sweet Gum Negative    24. Pecan Pollen  Negative    25. Pine Mix 2+    26. Walnut, Black Pollen Negative    27. Red Mulberry Negative    28. Ash Mix Negative    29. Birch Mix Negative    30. Beech American Negative    31. Cottonwood, Eastern Negative    32. Hickory, White Negative    33. Maple Mix Negative    34. Oak, Eastern Mix Negative    35. Sycamore Eastern Negative    36. Alternaria Alternata Negative    37. Cladosporium Herbarum Negative    38. Aspergillus Mix Negative    39. Penicillium Mix Negative    40. Bipolaris Sorokiniana (Helminthosporium) 2+    41. Drechslera Spicifera (Curvularia) Negative    42. Mucor Plumbeus Negative    43. Fusarium Moniliforme Negative    44. Aureobasidium Pullulans (pullulara) Negative    45. Rhizopus Oryzae Negative    46. Botrytis Cinera Negative    47. Epicoccum Nigrum Negative    48. Phoma Betae Negative    49. Dust Mite Mix Negative    50. Cat Hair 10,000 BAU/ml Negative    51.  Dog Epithelia Negative    52. Mixed Feathers Negative    53. Horse Epithelia Negative    54. Cockroach, German Negative    55. Tobacco Leaf Negative          Intradermal - 10/19/24 0946     Time Antigen Placed 9053    Allergen Manufacturer Jestine    Location Back    Number of Test 14    Control Negative    Bahia Negative    Bermuda Negative    Johnson Negative    7 Grass Negative    Ragweed Mix Negative    Tree Mix Negative    Mold 1 Negative    Mold 2 Negative    Mold 4 Negative    Mite Mix 3+    Cat Negative    Dog Negative    Cockroach Negative          Previous notes and tests were reviewed. The plan was reviewed with the patient/family, and all questions/concerned were addressed.  It was my pleasure to see Jennifer [name] today and participate in her care. Please feel free to contact me with any questions or concerns.  Sincerely,  Orlan Cramp, DO Allergy  & Immunology  [facility]  [facility] office: [phone] [facility] office: [phone] "

## 2024-10-19 ENCOUNTER — Ambulatory Visit: Admitting: Allergy

## 2024-10-19 ENCOUNTER — Encounter: Payer: Self-pay | Admitting: Allergy

## 2024-10-19 DIAGNOSIS — J3089 Other allergic rhinitis: Secondary | ICD-10-CM

## 2024-10-19 DIAGNOSIS — J301 Allergic rhinitis due to pollen: Secondary | ICD-10-CM

## 2024-10-19 DIAGNOSIS — J329 Chronic sinusitis, unspecified: Secondary | ICD-10-CM

## 2024-10-19 NOTE — Patient Instructions (Addendum)
 10/19/2024 skin testing positive to weed, trees, mold, and dust mites.  Results given.  Environmental allergies Start environmental control measures as below. Use over the counter antihistamines such as Zyrtec (cetirizine), Claritin  (loratadine ), Allegra (fexofenadine), or Xyzal (levocetirizine) daily as needed. May take twice a day during allergy  flares. May switch antihistamines every few months. Consider allergy  injections for long term control if above medications do not help the symptoms - handout given.   Continue Singulair (montelukast) 10mg  daily at night. Use Flonase  (fluticasone ) nasal spray 1-2 sprays per nostril once a day as needed for nasal congestion.  Nasal saline spray (i.e., Simply Saline) or nasal saline lavage (i.e., NeilMed) is recommended as needed and prior to medicated nasal sprays.  ONLY USE IF YOU HAVE DRAINAGE: Use azelastine  nasal spray 1-2 sprays per nostril twice a day as needed for runny nose/drainage. OR Use Atrovent  (ipratropium) 0.03% 1-2 sprays per nostril twice a day as needed for runny nose/drainage.  Infections Keep track of infections and antibiotics use. Get bloodwork next to look at immune system.  We are ordering labs, so please allow 1-2 weeks for the results to come back. With the newly implemented Cures Act, the labs might be visible to you at the same time that they become visible to me. However, I will not address the results until all of the results are back, so please be patient.  In the meantime, continue recommendations in your patient instructions, including avoidance measures (if applicable), until you hear from me.  Return in about 4 months (around 02/16/2025). Or sooner if needed.   Reducing Pollen Exposure Pollen seasons: trees (spring), grass (summer) and ragweed/weeds (fall). Keep windows closed in your home and car to lower pollen exposure.  Install air conditioning in the bedroom and throughout the house if possible.  Avoid going  out in dry windy days - especially early morning. Pollen counts are highest between 5 - 10 AM and on dry, hot and windy days.  Save outside activities for late afternoon or after a heavy rain, when pollen levels are lower.  Avoid mowing of grass if you have grass pollen allergy . Be aware that pollen can also be transported indoors on people and pets.  Dry your clothes in an automatic dryer rather than hanging them outside where they might collect pollen.  Rinse hair and eyes before bedtime.  Control of House Dust Mite Allergen Dust mite allergens are a common trigger of allergy  and asthma symptoms. While they can be found throughout the house, these microscopic creatures thrive in warm, humid environments such as bedding, upholstered furniture and carpeting. Because so much time is spent in the bedroom, it is essential to reduce mite levels there.  Encase pillows, mattresses, and box springs in special allergen-proof fabric covers or airtight, zippered plastic covers.  Bedding should be washed weekly in hot water  (130 F) and dried in a hot dryer. Allergen-proof covers are available for comforters and pillows that cant be regularly washed.  Wash the allergy -proof covers every few months. Minimize clutter in the bedroom. Keep pets out of the bedroom.  Keep humidity less than 50% by using a dehumidifier or air conditioning. You can buy a humidity measuring device called a hygrometer to monitor this.  If possible, replace carpets with hardwood, linoleum, or washable area rugs. If that's not possible, vacuum frequently with a vacuum that has a HEPA filter. Remove all upholstered furniture and non-washable window drapes from the bedroom. Remove all non-washable stuffed toys from the bedroom.  Wash stuffed toys weekly.  Mold Control Mold and fungi can grow on a variety of surfaces provided certain temperature and moisture conditions exist.  Outdoor molds grow on plants, decaying vegetation and soil.  The major outdoor mold, Alternaria and Cladosporium, are found in very high numbers during hot and dry conditions. Generally, a late summer - fall peak is seen for common outdoor fungal spores. Rain will temporarily lower outdoor mold spore count, but counts rise rapidly when the rainy period ends. The most important indoor molds are Aspergillus and Penicillium. Dark, humid and poorly ventilated basements are ideal sites for mold growth. The next most common sites of mold growth are the bathroom and the kitchen. Outdoor (Seasonal) Mold Control Use air conditioning and keep windows closed. Avoid exposure to decaying vegetation. Avoid leaf raking. Avoid grain handling. Consider wearing a face mask if working in moldy areas.  Indoor (Perennial) Mold Control  Maintain humidity below 50%. Get rid of mold growth on hard surfaces with water , detergent and, if necessary, 5% bleach (do not mix with other cleaners). Then dry the area completely. If mold covers an area more than 10 square feet, consider hiring an indoor environmental professional. For clothing, washing with soap and water  is best. If moldy items cannot be cleaned and dried, throw them away. Remove sources e.g. contaminated carpets. Repair and seal leaking roofs or pipes. Using dehumidifiers in damp basements may be helpful, but empty the water  and clean units regularly to prevent mildew from forming. All rooms, especially basements, bathrooms and kitchens, require ventilation and cleaning to deter mold and mildew growth. Avoid carpeting on concrete or damp floors, and storing items in damp areas.

## 2024-10-25 ENCOUNTER — Ambulatory Visit: Payer: Self-pay | Admitting: Allergy

## 2024-10-25 LAB — STREP PNEUMONIAE 23 SEROTYPES IGG
Pneumo Ab Type 1*: 0.7 ug/mL — AB
Pneumo Ab Type 12 (12F)*: 1.4 ug/mL
Pneumo Ab Type 14*: 0.8 ug/mL — AB
Pneumo Ab Type 17 (17F)*: 1.4 ug/mL
Pneumo Ab Type 19 (19F)*: 6.3 ug/mL
Pneumo Ab Type 2*: 19.3 ug/mL
Pneumo Ab Type 20*: 3.5 ug/mL
Pneumo Ab Type 22 (22F)*: 0.5 ug/mL — AB
Pneumo Ab Type 23 (23F)*: 0.8 ug/mL — AB
Pneumo Ab Type 26 (6B)*: 0.5 ug/mL — AB
Pneumo Ab Type 3*: 0.3 ug/mL — AB
Pneumo Ab Type 34 (10A)*: 5.3 ug/mL
Pneumo Ab Type 4*: 0.2 ug/mL — AB
Pneumo Ab Type 43 (11A)*: >9.2 ug/mL
Pneumo Ab Type 5*: 0.7 ug/mL — AB
Pneumo Ab Type 51 (7F)*: 0.6 ug/mL — AB
Pneumo Ab Type 54 (15B)*: 6.4 ug/mL
Pneumo Ab Type 56 (18C)*: 0.7 ug/mL — AB
Pneumo Ab Type 57 (19A)*: 4 ug/mL
Pneumo Ab Type 68 (9V)*: 0.4 ug/mL — AB
Pneumo Ab Type 70 (33F)*: 1.4 ug/mL
Pneumo Ab Type 8*: 0.4 ug/mL — AB
Pneumo Ab Type 9 (9N)*: 0.9 ug/mL — AB

## 2024-10-25 LAB — CBC WITH DIFFERENTIAL/PLATELET
Basophils Absolute: 0 x10E3/uL (ref 0.0–0.2)
Basos: 1 %
EOS (ABSOLUTE): 0.1 x10E3/uL (ref 0.0–0.4)
Eos: 1 %
Hematocrit: 46.9 % — ABNORMAL HIGH (ref 34.0–46.6)
Hemoglobin: 14.6 g/dL (ref 11.1–15.9)
Immature Grans (Abs): 0 x10E3/uL (ref 0.0–0.1)
Immature Granulocytes: 0 %
Lymphocytes Absolute: 2 x10E3/uL (ref 0.7–3.1)
Lymphs: 28 %
MCH: 26.9 pg (ref 26.6–33.0)
MCHC: 31.1 g/dL — ABNORMAL LOW (ref 31.5–35.7)
MCV: 87 fL (ref 79–97)
Monocytes Absolute: 0.7 x10E3/uL (ref 0.1–0.9)
Monocytes: 9 %
Neutrophils Absolute: 4.4 x10E3/uL (ref 1.4–7.0)
Neutrophils: 61 %
Platelets: 348 x10E3/uL (ref 150–450)
RBC: 5.42 x10E6/uL — ABNORMAL HIGH (ref 3.77–5.28)
RDW: 14.2 % (ref 11.7–15.4)
WBC: 7.2 x10E3/uL (ref 3.4–10.8)

## 2024-10-25 LAB — IGG, IGA, IGM
IgG (Immunoglobin G), Serum: 1267 mg/dL (ref 586–1602)
IgM (Immunoglobulin M), Srm: 128 mg/dL (ref 26–217)
Immunoglobulin A, (IgA) QN, Serum: 246 mg/dL (ref 87–352)

## 2024-10-25 LAB — DIPHTHERIA / TETANUS ANTIBODY PANEL
Diphtheria Ab: 0.58 [IU]/mL
Tetanus Ab, IgG: 1.99 [IU]/mL

## 2024-11-03 ENCOUNTER — Ambulatory Visit
Admission: RE | Admit: 2024-11-03 | Discharge: 2024-11-03 | Disposition: A | Discharge status: Home / Self Care | Source: Ambulatory Visit | Attending: Otolaryngology | Admitting: Otolaryngology

## 2024-11-03 DIAGNOSIS — R1310 Dysphagia, unspecified: Secondary | ICD-10-CM

## 2024-11-16 ENCOUNTER — Ambulatory Visit

## 2024-11-16 DIAGNOSIS — Z23 Encounter for immunization: Secondary | ICD-10-CM

## 2025-02-15 ENCOUNTER — Ambulatory Visit: Admitting: Allergy
